# Patient Record
Sex: Female | Born: 1955
Health system: Southern US, Community
[De-identification: ages and names within clinical notes are randomized; demographics above are authoritative.]

## PROBLEM LIST (undated history)

## (undated) ENCOUNTER — Ambulatory Visit: Admission: EM | Payer: PPO

## (undated) DIAGNOSIS — E041 Nontoxic single thyroid nodule: Secondary | ICD-10-CM

## (undated) DIAGNOSIS — M069 Rheumatoid arthritis, unspecified: Secondary | ICD-10-CM

## (undated) DIAGNOSIS — K219 Gastro-esophageal reflux disease without esophagitis: Secondary | ICD-10-CM

## (undated) DIAGNOSIS — Z90710 Acquired absence of both cervix and uterus: Secondary | ICD-10-CM

## (undated) DIAGNOSIS — R059 Cough, unspecified: Secondary | ICD-10-CM

## (undated) DIAGNOSIS — K5792 Diverticulitis of intestine, part unspecified, without perforation or abscess without bleeding: Secondary | ICD-10-CM

## (undated) DIAGNOSIS — M7120 Synovial cyst of popliteal space [Baker], unspecified knee: Secondary | ICD-10-CM

## (undated) DIAGNOSIS — E039 Hypothyroidism, unspecified: Secondary | ICD-10-CM

## (undated) DIAGNOSIS — R499 Unspecified voice and resonance disorder: Secondary | ICD-10-CM

## (undated) DIAGNOSIS — R0981 Nasal congestion: Secondary | ICD-10-CM

## (undated) DIAGNOSIS — M1990 Unspecified osteoarthritis, unspecified site: Secondary | ICD-10-CM

## (undated) DIAGNOSIS — R05 Cough: Secondary | ICD-10-CM

## (undated) HISTORY — DX: Gastro-esophageal reflux disease without esophagitis: K21.9

## (undated) HISTORY — DX: Synovial cyst of popliteal space (Baker), unspecified knee: M71.20

## (undated) HISTORY — DX: Acquired absence of both cervix and uterus: Z90.710

## (undated) HISTORY — DX: Hypothyroidism, unspecified: E03.9

## (undated) HISTORY — DX: Nasal congestion: R09.81

## (undated) HISTORY — DX: Cough: R05

## (undated) HISTORY — DX: Cough, unspecified: R05.9

## (undated) HISTORY — PX: REPLACEMENT TOTAL KNEE: SUR1224

## (undated) HISTORY — DX: Unspecified osteoarthritis, unspecified site: M19.90

## (undated) HISTORY — DX: Unspecified voice and resonance disorder: R49.9

## (undated) HISTORY — DX: Nontoxic single thyroid nodule: E04.1

---

## 1986-01-14 HISTORY — PX: ABDOMINAL HYSTERECTOMY: SHX81

## 1997-10-11 ENCOUNTER — Other Ambulatory Visit: Admission: RE | Admit: 1997-10-11 | Discharge: 1997-10-11 | Payer: Self-pay | Admitting: Obstetrics and Gynecology

## 2000-09-16 ENCOUNTER — Other Ambulatory Visit: Admission: RE | Admit: 2000-09-16 | Discharge: 2000-09-16 | Payer: Self-pay | Admitting: Obstetrics and Gynecology

## 2001-02-27 ENCOUNTER — Encounter: Payer: Self-pay | Admitting: Obstetrics and Gynecology

## 2001-02-27 ENCOUNTER — Ambulatory Visit (HOSPITAL_COMMUNITY): Admission: RE | Admit: 2001-02-27 | Discharge: 2001-02-27 | Payer: Self-pay | Admitting: Obstetrics and Gynecology

## 2001-07-03 ENCOUNTER — Encounter: Admission: RE | Admit: 2001-07-03 | Discharge: 2001-07-03 | Payer: Self-pay | Admitting: Family Medicine

## 2001-07-03 ENCOUNTER — Encounter: Payer: Self-pay | Admitting: Family Medicine

## 2003-03-24 ENCOUNTER — Other Ambulatory Visit: Admission: RE | Admit: 2003-03-24 | Discharge: 2003-03-24 | Payer: Self-pay | Admitting: Obstetrics and Gynecology

## 2003-10-10 ENCOUNTER — Encounter: Admission: RE | Admit: 2003-10-10 | Discharge: 2003-10-10 | Payer: Self-pay | Admitting: Otolaryngology

## 2003-12-12 ENCOUNTER — Encounter (INDEPENDENT_AMBULATORY_CARE_PROVIDER_SITE_OTHER): Payer: Self-pay | Admitting: *Deleted

## 2003-12-12 ENCOUNTER — Ambulatory Visit (HOSPITAL_BASED_OUTPATIENT_CLINIC_OR_DEPARTMENT_OTHER): Admission: RE | Admit: 2003-12-12 | Discharge: 2003-12-12 | Payer: Self-pay | Admitting: Otolaryngology

## 2003-12-12 ENCOUNTER — Ambulatory Visit (HOSPITAL_COMMUNITY): Admission: RE | Admit: 2003-12-12 | Discharge: 2003-12-12 | Payer: Self-pay | Admitting: Otolaryngology

## 2004-11-27 ENCOUNTER — Ambulatory Visit: Payer: Self-pay | Admitting: Internal Medicine

## 2004-11-27 ENCOUNTER — Ambulatory Visit: Payer: Self-pay | Admitting: Family Medicine

## 2006-01-14 HISTORY — PX: SINUS SURGERY WITH INSTATRAK: SHX5215

## 2006-07-09 ENCOUNTER — Ambulatory Visit: Payer: Self-pay | Admitting: Family Medicine

## 2006-07-09 DIAGNOSIS — J329 Chronic sinusitis, unspecified: Secondary | ICD-10-CM | POA: Insufficient documentation

## 2007-04-30 ENCOUNTER — Encounter: Admission: RE | Admit: 2007-04-30 | Discharge: 2007-04-30 | Payer: Self-pay | Admitting: Family Medicine

## 2007-05-05 ENCOUNTER — Ambulatory Visit (HOSPITAL_COMMUNITY): Admission: RE | Admit: 2007-05-05 | Discharge: 2007-05-05 | Payer: Self-pay | Admitting: Family Medicine

## 2007-05-05 ENCOUNTER — Encounter (INDEPENDENT_AMBULATORY_CARE_PROVIDER_SITE_OTHER): Payer: Self-pay | Admitting: Interventional Radiology

## 2007-10-23 ENCOUNTER — Encounter: Admission: RE | Admit: 2007-10-23 | Discharge: 2007-10-23 | Payer: Self-pay | Admitting: Otolaryngology

## 2007-12-14 ENCOUNTER — Ambulatory Visit (HOSPITAL_COMMUNITY): Admission: RE | Admit: 2007-12-14 | Discharge: 2007-12-14 | Payer: Self-pay | Admitting: Gastroenterology

## 2008-02-25 ENCOUNTER — Ambulatory Visit (HOSPITAL_COMMUNITY): Admission: RE | Admit: 2008-02-25 | Discharge: 2008-02-25 | Payer: Self-pay | Admitting: Family Medicine

## 2008-03-03 ENCOUNTER — Encounter (INDEPENDENT_AMBULATORY_CARE_PROVIDER_SITE_OTHER): Payer: Self-pay | Admitting: Cardiology

## 2008-03-03 ENCOUNTER — Ambulatory Visit (HOSPITAL_COMMUNITY): Admission: RE | Admit: 2008-03-03 | Discharge: 2008-03-03 | Payer: Self-pay | Admitting: Cardiology

## 2008-03-18 ENCOUNTER — Encounter (HOSPITAL_COMMUNITY): Admission: RE | Admit: 2008-03-18 | Discharge: 2008-06-16 | Payer: Self-pay | Admitting: Cardiology

## 2008-05-20 ENCOUNTER — Emergency Department (HOSPITAL_COMMUNITY): Admission: EM | Admit: 2008-05-20 | Discharge: 2008-05-20 | Payer: Self-pay | Admitting: Family Medicine

## 2008-06-16 ENCOUNTER — Ambulatory Visit (HOSPITAL_COMMUNITY): Admission: RE | Admit: 2008-06-16 | Discharge: 2008-06-16 | Payer: Self-pay | Admitting: Endocrinology

## 2008-07-28 ENCOUNTER — Encounter: Admission: RE | Admit: 2008-07-28 | Discharge: 2008-07-28 | Payer: Self-pay | Admitting: Obstetrics and Gynecology

## 2009-06-16 ENCOUNTER — Ambulatory Visit (HOSPITAL_COMMUNITY): Admission: RE | Admit: 2009-06-16 | Discharge: 2009-06-16 | Payer: Self-pay | Admitting: Endocrinology

## 2009-08-01 ENCOUNTER — Encounter: Admission: RE | Admit: 2009-08-01 | Discharge: 2009-08-01 | Payer: Self-pay | Admitting: Obstetrics and Gynecology

## 2009-08-04 ENCOUNTER — Encounter: Admission: RE | Admit: 2009-08-04 | Discharge: 2009-08-04 | Payer: Self-pay | Admitting: Obstetrics and Gynecology

## 2009-12-19 ENCOUNTER — Emergency Department (HOSPITAL_COMMUNITY)
Admission: EM | Admit: 2009-12-19 | Discharge: 2009-12-19 | Payer: Self-pay | Source: Home / Self Care | Admitting: Family Medicine

## 2009-12-20 ENCOUNTER — Ambulatory Visit
Admission: RE | Admit: 2009-12-20 | Discharge: 2009-12-20 | Payer: Self-pay | Source: Home / Self Care | Admitting: Family Medicine

## 2009-12-20 ENCOUNTER — Encounter (INDEPENDENT_AMBULATORY_CARE_PROVIDER_SITE_OTHER): Payer: Self-pay | Admitting: Family Medicine

## 2010-01-11 ENCOUNTER — Ambulatory Visit (HOSPITAL_COMMUNITY)
Admission: RE | Admit: 2010-01-11 | Discharge: 2010-01-11 | Payer: Self-pay | Source: Home / Self Care | Attending: Endocrinology | Admitting: Endocrinology

## 2010-02-04 ENCOUNTER — Encounter: Payer: Self-pay | Admitting: Endocrinology

## 2010-02-04 ENCOUNTER — Encounter: Payer: Self-pay | Admitting: Family Medicine

## 2010-02-05 ENCOUNTER — Encounter: Payer: Self-pay | Admitting: Endocrinology

## 2010-03-26 LAB — CBC
HCT: 43 % (ref 36.0–46.0)
Hemoglobin: 14.5 g/dL (ref 12.0–15.0)
MCH: 30.4 pg (ref 26.0–34.0)
MCHC: 33.7 g/dL (ref 30.0–36.0)
MCV: 90.1 fL (ref 78.0–100.0)
Platelets: 199 10*3/uL (ref 150–400)
RDW: 12.7 % (ref 11.5–15.5)
WBC: 6.4 10*3/uL (ref 4.0–10.5)

## 2010-03-26 LAB — POCT URINALYSIS DIPSTICK
Glucose, UA: NEGATIVE mg/dL
Specific Gravity, Urine: 1.01 (ref 1.005–1.030)
pH: 7 (ref 5.0–8.0)

## 2010-03-26 LAB — POCT I-STAT, CHEM 8
BUN: 16 mg/dL (ref 6–23)
Chloride: 102 mEq/L (ref 96–112)
HCT: 47 % — ABNORMAL HIGH (ref 36.0–46.0)
Potassium: 3.8 mEq/L (ref 3.5–5.1)
Sodium: 138 mEq/L (ref 135–145)
TCO2: 28 mmol/L (ref 0–100)

## 2010-03-26 LAB — D-DIMER, QUANTITATIVE: D-Dimer, Quant: 0.66 ug/mL-FEU — ABNORMAL HIGH (ref 0.00–0.48)

## 2010-05-29 NOTE — Op Note (Signed)
NAMEHEER, Olivia Rubio               ACCOUNT NO.:  [id number]   MEDICAL RECORD NO.:  [id number]          PATIENT TYPE:  AMB   LOCATION:  ENDO                         FACILITY:  [facility]   PHYSICIAN:  James L. Malon Kindle., M.D.DATE OF BIRTH:  11-03-1955   DATE OF PROCEDURE:  12/14/2007  DATE OF DISCHARGE:                               OPERATIVE REPORT   PROCEDURE:  Colonoscopy.   MEDICATIONS:  The patient received a total of fentanyl 110 mcg and  Versed 12 mg for both procedures.   INDICATIONS:  The patient has had recurrent bouts of diverticulitis that  has been treated with antibiotics.  This is done to evaluate her colon.   DESCRIPTION OF PROCEDURE:  Procedure explained to the patient and  consent obtained following endoscopy.  The patient was turned around and  the Pentex pediatric colonoscope was inserted.  She had some very mild  diverticular disease in the sigmoid colon, a long tortuous colon.  We  were eventually able to advance to the cecum after putting her in the  supine position and finally the right lateral decubitus position and  using abdominal pressure, the ileocecal valve and the appendiceal  orifice were seen.  The scope was withdrawn.  The cecum, ascending,  transverse and descending colon were seen well.  No polyps were seen  throughout the entire colon.  There was no diverticular disease of  significance in the descending colon.  Again, there was some mild  diverticulosis in the sigmoid.  The scope was withdrawn to the rectum  and internal hemorrhoids were seen in the rectum.  The scope was  withdrawn.  The patient tolerated the procedure well.   ASSESSMENT:  1. Mild diverticulosis of the sigmoid colon with no active signs of      diverticulitis.  2. Long tortuous colon.  3. Internal hemorrhoids.   PLAN:  We will give her a diverticulosis information sheet, keep her on  fiber and soft foods, and we will see her back in the office in 2  months.     ______________________________  Llana Aliment. Malon Kindle., M.D.     Waldron Session  D:  12/14/2007  T:  12/14/2007  Job:  [id number]   cc:   Lyla Son, M.D. [facility] Spore

## 2010-05-29 NOTE — Op Note (Signed)
Olivia Rubio, Olivia Rubio               ACCOUNT NO.:  [id number]   MEDICAL RECORD NO.:  [id number]          PATIENT TYPE:  AMB   LOCATION:  ENDO                         [facility]ACILITY:  [facility]   PHYSICIAN:  James L. Malon Kindle., M.D.DATE OF BIRTH:  [birth year]   DATE OF PROCEDURE:  12/14/2007  DATE OF DISCHARGE:                               OPERATIVE REPORT   PROCEDURE:  Esophagogastroduodenoscopy.   MEDICATIONS:  1. Hurricaine spray.  2. Fentanyl 70 mcg.  3. Versed 8 mg IV.   INDICATIONS:  Chronic esophageal reflux.   DESCRIPTION OF PROCEDURE:  The procedure had been explained to the  patient and consent obtained.  In the left lateral decubitus position,  the Pentax video endoscope was inserted blindly into the esophagus and  advanced under direct visualization.  The stomach was entered, pylorus  identified and passed.  The duodenum including the bulb and second  portion were seen well and were unremarkable.  The pyloric channel and  antrum were normal as was the body of the stomach.  The fundus and  cardia were seen well and were normal.  There was a hiatal hernia and a  widely patent GE junction.  There was no Barrett's esophagus, varices or  esophagitis.  The scope was withdrawn.  The patient tolerated the  procedure well.   ASSESSMENT:  Hiatal hernia with a widely patent gastroesophageal  junction.  I think she clearly has esophageal reflux, no signs of  Barrett's esophagus.   PLAN:  We will go ahead and give her a reflux sheet and continue her on  her antireflux medications.  We will proceed with colonoscopy at this  time as planned.           ______________________________  Llana Aliment. Malon Kindle., M.D.     Waldron Session  D:  12/14/2007  T:  12/14/2007  Job:  [id number]   cc:   Aurther Loft, MD [facility]ore

## 2010-06-01 NOTE — Op Note (Signed)
[name], Olivia Rubio               ACCOUNT NO.:  [id number]   MEDICAL RECORD NO.:  [id number]          PATIENT TYPE:  AMB   LOCATION:  DSC                          FACILITY:  [facility]   PHYSICIAN:  Jefry H. Pollyann Kennedy, MD     DATE OF BIRTH:  [birth year]   DATE OF PROCEDURE:  12/12/2003  DATE OF DISCHARGE:                                 OPERATIVE REPORT   PREOPERATIVE DIAGNOSIS:  1.  Chronic left ethmoid sinusitis.  2.  Left ethmoid mucocele.  3.  Concha bullosa.   POSTOPERATIVE DIAGNOSES:  1.  Chronic left ethmoid sinusitis.  2.  Left ethmoid mucocele.  3.  Concha bullosa.   PROCEDURES:  1.  Left endoscopic total ethmoidectomy.  2.  Left endoscopic middle turbinate resection.   SURGEON:  Jefry H. Pollyann Kennedy, MD   General endotracheal anesthesia was used.  No complications.  Blood loss  minimal.   FINDINGS:  Very large concha bullosa, left middle turbinate.  Hyperplastic  left ethmoid mucosa throughout multiple cells.  A large ethmoid mucocele  with granular yellow, thick material contained within in addition to thick  mucus.   REFERRING PHYSICIAN:  Carola J. Gerri Spore, M.D.   No complications.  The patient tolerated the procedure well, was awakened,  extubated, and transferred to recovery in stable condition.   HISTORY:  This is a 55 year old lady with a history of chronic left ethmoid  sinus disease and large mucocele.  Risks, benefits, alternatives, and  complications of the procedure were explained to the patient, who seemed to  understand and agreed to surgery.   PROCEDURE:  The patient was taken to the operating room and placed on the  operating table in the supine position.  Following induction of general  endotracheal anesthesia, the face was prepped and draped in a standard  fashion.  Oxymetazoline spray was used in the nose preoperatively.  Xylocaine 1% with epinephrine was infiltrated into the superior and  posterior attachment to the middle turbinate and the lateral  nasal wall.  Afrin-soaked pledgets were placed in the left nasal cavity and left middle  meatus for several minutes.   #1.  Left total ethmoidectomy:  The left ethmoidectomy was accomplished  initially by removing the uncinate process with a sickle knife.  Suction was  used to enter into the anterior cells and into the mucocele.  The  microdebrider and through-cut forceps were used to take down all bony  partitions of the ethmoid cells.  The frontal recess area was wide open and  clear.  The natural ostium of the left maxillary sinus was also open and  clear.  A complete ethmoidectomy was performed, keeping the lamina papyracea  intact, the fovea and ethmoid roof intact as well.  The ground lamella was  taken down in order to open up the posterior cells, which is where the  mucocele was contained superiorly.  After complete ethmoidectomy, the  ethmoid cavity was packed with Afrin-soaked pledgets, which were then  removed in the recovery room.   #2.  Left turbinate resection.  A sickle knife was used to enter into the  leading edge of the middle turbinate, which contained a large concha  bullosa.  Through-cut forceps were then used to remove the lateral aspect of  the concha, keeping the medial wall intact.  This was done prior to the  ethmoidectomy and allowed easy access into the ethmoid cavity.      Jefr   JHR/MEDQ  D:  12/12/2003  T:  12/12/2003  Job:  [id number]   cc:   Otilio Connors. Gerri Spore, M.D.  [address]  [place]  [place] [address]  Fax: [phone]

## 2010-06-27 ENCOUNTER — Other Ambulatory Visit (HOSPITAL_COMMUNITY): Payer: Self-pay | Admitting: Orthopedic Surgery

## 2010-06-27 DIAGNOSIS — M25561 Pain in right knee: Secondary | ICD-10-CM

## 2010-06-30 ENCOUNTER — Ambulatory Visit (HOSPITAL_COMMUNITY)
Admission: RE | Admit: 2010-06-30 | Discharge: 2010-06-30 | Disposition: A | Discharge status: Home / Self Care | Payer: Commercial Managed Care - PPO | Source: Ambulatory Visit | Attending: Orthopedic Surgery | Admitting: Orthopedic Surgery

## 2010-06-30 DIAGNOSIS — M2240 Chondromalacia patellae, unspecified knee: Secondary | ICD-10-CM | POA: Insufficient documentation

## 2010-06-30 DIAGNOSIS — X58XXXA Exposure to other specified factors, initial encounter: Secondary | ICD-10-CM | POA: Insufficient documentation

## 2010-06-30 DIAGNOSIS — IMO0002 Reserved for concepts with insufficient information to code with codable children: Secondary | ICD-10-CM | POA: Insufficient documentation

## 2010-06-30 DIAGNOSIS — M25469 Effusion, unspecified knee: Secondary | ICD-10-CM | POA: Insufficient documentation

## 2010-06-30 DIAGNOSIS — M25569 Pain in unspecified knee: Secondary | ICD-10-CM | POA: Insufficient documentation

## 2010-06-30 DIAGNOSIS — M25561 Pain in right knee: Secondary | ICD-10-CM

## 2010-08-28 ENCOUNTER — Ambulatory Visit: Payer: 59 | Attending: Orthopedic Surgery | Admitting: Physical Therapy

## 2010-08-28 DIAGNOSIS — M25669 Stiffness of unspecified knee, not elsewhere classified: Secondary | ICD-10-CM | POA: Insufficient documentation

## 2010-08-28 DIAGNOSIS — M6281 Muscle weakness (generalized): Secondary | ICD-10-CM | POA: Insufficient documentation

## 2010-08-28 DIAGNOSIS — IMO0001 Reserved for inherently not codable concepts without codable children: Secondary | ICD-10-CM | POA: Insufficient documentation

## 2010-08-28 DIAGNOSIS — M25569 Pain in unspecified knee: Secondary | ICD-10-CM | POA: Insufficient documentation

## 2010-09-13 ENCOUNTER — Encounter: Payer: 59 | Admitting: Physical Therapy

## 2010-09-13 ENCOUNTER — Ambulatory Visit: Payer: 59

## 2010-09-21 ENCOUNTER — Encounter: Payer: 59 | Admitting: Physical Therapy

## 2010-10-05 ENCOUNTER — Other Ambulatory Visit: Payer: Self-pay | Admitting: Obstetrics and Gynecology

## 2010-10-05 DIAGNOSIS — Z1231 Encounter for screening mammogram for malignant neoplasm of breast: Secondary | ICD-10-CM

## 2010-10-18 ENCOUNTER — Ambulatory Visit: Payer: 59

## 2010-10-22 ENCOUNTER — Other Ambulatory Visit: Payer: Self-pay | Admitting: Obstetrics and Gynecology

## 2010-10-22 DIAGNOSIS — Z1231 Encounter for screening mammogram for malignant neoplasm of breast: Secondary | ICD-10-CM

## 2010-10-23 ENCOUNTER — Ambulatory Visit
Admission: RE | Admit: 2010-10-23 | Discharge: 2010-10-23 | Disposition: A | Discharge status: Home / Self Care | Payer: 59 | Source: Ambulatory Visit | Attending: Obstetrics and Gynecology | Admitting: Obstetrics and Gynecology

## 2010-10-23 DIAGNOSIS — Z1231 Encounter for screening mammogram for malignant neoplasm of breast: Secondary | ICD-10-CM

## 2010-11-09 ENCOUNTER — Encounter (INDEPENDENT_AMBULATORY_CARE_PROVIDER_SITE_OTHER): Payer: Self-pay | Admitting: Surgery

## 2010-11-12 ENCOUNTER — Encounter (INDEPENDENT_AMBULATORY_CARE_PROVIDER_SITE_OTHER): Payer: Self-pay | Admitting: Surgery

## 2010-11-12 ENCOUNTER — Ambulatory Visit (INDEPENDENT_AMBULATORY_CARE_PROVIDER_SITE_OTHER): Payer: Commercial Managed Care - PPO | Admitting: Surgery

## 2010-11-12 VITALS — BP 138/82 | HR 80 | Temp 97.6°F | Resp 16 | Ht 68.0 in | Wt 206.2 lb

## 2010-11-12 DIAGNOSIS — E042 Nontoxic multinodular goiter: Secondary | ICD-10-CM

## 2010-11-12 NOTE — Progress Notes (Signed)
Chief Complaint  Patient presents with  . Thyroid Nodule    new pt- eval thyroid nodule enlarged right node and trouble swallowing - referral by Dr. Marcelle Overlie    HISTORY: Patient is a 55 year old white female referred by her gynecologist for evaluation of thyroid nodules. She was initially diagnosed in 2008. She has been followed by her primary physician, her gynecologist, and her endocrinologist. Patient is on thyroid medication taking levothyroxine 25 mcg daily. Recent TSH level is in the low normal range. Patient has been followed with sequential ultrasound scanning. Most recent ultrasound is from December 2011. This shows bilateral thyroid nodules with a dominant nodule in the lower left thyroid lobe measuring 1.7 cm in size. She had a previous fine needle aspiration biopsy of this nodule and 2009 which showed changes consistent with nonneoplastic goiter.  Patient has a sister with thyroid nodules. Her daughter has undergone thyroidectomy for thyroid nodules. There is no family history of thyroid cancer. There is no family history of other endocrinopathy.  Patient complains of dysphagia 2-3 times weekly. She experiences a choking sensation. She does have a chronic morning cough. Patient has known gastroesophageal reflux disease and has undergone upper endoscopy by her gastroenterologist in 2011. She is on proton pump inhibitors with poor control.   Past Medical History  Diagnosis Date  . H/O: hysterectomy   . Endometriosis   . Baker's cyst of knee   . Thyroid nodule   . Acid reflux   . Hypothyroidism   . Arthritis   . Osteoporosis      Current Outpatient Prescriptions  Medication Sig Dispense Refill  . levothyroxine (SYNTHROID, LEVOTHROID) 25 MCG tablet Take 25 mcg by mouth daily.        . meloxicam (MOBIC) 15 MG tablet Take 15 mg by mouth daily.        . pantoprazole (PROTONIX) 40 MG tablet Take 40 mg by mouth daily.           No Known Allergies   Family History    Problem Relation Age of Onset  . Thyroid disease Sister   . Arthritis Sister   . Diabetes Father   . Breast cancer Mother   . Cancer Mother     breast  . Cancer Brother     lymphoma  . Cancer Maternal Aunt     breast  . Cancer Paternal Grandmother     breast     History   Social History  . Marital Status: Married    Spouse Name: N/A    Number of Children: N/A  . Years of Education: N/A   Social History Main Topics  . Smoking status: Never Smoker   . Smokeless tobacco: None  . Alcohol Use: No  . Drug Use: No  . Sexually Active:    Other Topics Concern  . None   Social History Narrative  . None     REVIEW OF SYSTEMS - PERTINENT POSITIVES ONLY: Patient notes occasional tremors. She notes occasional palpitations. Patient notes dysphagia 2-3 times weekly with choking sensation. She notes a chronic morning cough.  EXAM: Filed Vitals:   11/12/10 0902  BP: 138/82  Pulse: 80  Temp: 97.6 F (36.4 C)  Resp: 16    HEENT: normocephalic; pupils equal and reactive; sclerae clear; dentition good; mucous membranes moist NECK:  Palpable 1-2 cm nodule in the inferior pole of the left thyroid lobe with swallowing. Isthmus is normal. Right lobe is slightly nodular without discrete or dominant mass; symmetric  on extension; no palpable anterior or posterior cervical lymphadenopathy; no supraclavicular masses; no tenderness CHEST: clear to auscultation bilaterally without rales, rhonchi, or wheezes CARDIAC: regular rate and rhythm without significant murmur; peripheral pulses are full EXT:  non-tender without edema; no deformity NEURO: no gross focal deficits; no sign of tremor   LABORATORY RESULTS: See E-Chart for most recent results   RADIOLOGY RESULTS: See E-Chart or I-Site for most recent results   IMPRESSION: #1 multinodular thyroid with dominant left inferior pole nodule, 1.8 cm, medically stable, with benign cytopathology #2 dysphagia of undetermined  etiology #3 gastroesophageal reflux with poor medical control   PLAN: I discussed the above findings with the patient at length. I provided her with written literature regarding thyroid nodules and they're management. I expressed to her that I do not believe her dysphagia or her cough are related to her relatively small thyroid nodules. She does not have a goiter and I think her thyroid nodules can safely be followed with sequential ultrasound scanning. Thyroid functions are good and she does not need to have any adjustment in her thyroid medication at this point in time.  I suspect her dysphagia and irritated throat and chronic cough are all related to poorly controlled gastroesophageal reflux. She likely requires evaluation by her gastroenterologist.  Patient will return for followup of her thyroid nodules in one year. We will repeat a thyroid ultrasound in December 2012 for comparison to her study in 2011.  Velora Heckler, MD, FACS General & Endocrine Surgery [facility]      Visit Diagnoses: 1. Multinodular goiter (nontoxic), dominant left thyroid nodule     Primary Care Physician: Jeani Hawking, MD, MD  Primary MD:  Hall Busing, MD

## 2010-12-13 ENCOUNTER — Ambulatory Visit (HOSPITAL_COMMUNITY)
Admission: RE | Admit: 2010-12-13 | Discharge: 2010-12-13 | Disposition: A | Discharge status: Home / Self Care | Payer: Commercial Managed Care - PPO | Source: Ambulatory Visit | Attending: Surgery | Admitting: Surgery

## 2010-12-13 DIAGNOSIS — E042 Nontoxic multinodular goiter: Secondary | ICD-10-CM | POA: Insufficient documentation

## 2010-12-13 DIAGNOSIS — R599 Enlarged lymph nodes, unspecified: Secondary | ICD-10-CM | POA: Insufficient documentation

## 2011-01-03 ENCOUNTER — Telehealth (INDEPENDENT_AMBULATORY_CARE_PROVIDER_SITE_OTHER): Payer: Self-pay

## 2011-01-03 NOTE — Telephone Encounter (Signed)
Void/error

## 2011-01-04 ENCOUNTER — Telehealth (INDEPENDENT_AMBULATORY_CARE_PROVIDER_SITE_OTHER): Payer: Self-pay

## 2011-01-04 NOTE — Telephone Encounter (Signed)
Pt called stating she had thyroid ultrasound 3 weeks ago and requested result. I reviewed u/s result with pt and advised her I would be sure Dr Gerrit Friends reviewed the result and would call her if any other recommendations were added.

## 2011-01-09 NOTE — Telephone Encounter (Signed)
Ultrasound results reviewed.  Nodules are stable with no significant changes or worrisome findings.  Recommend follow up ultrasound in 1 year.  Please contact patient with above information.  Thanks,  tmg

## 2011-01-10 ENCOUNTER — Other Ambulatory Visit (INDEPENDENT_AMBULATORY_CARE_PROVIDER_SITE_OTHER): Payer: Self-pay | Admitting: Surgery

## 2011-01-10 DIAGNOSIS — E042 Nontoxic multinodular goiter: Secondary | ICD-10-CM

## 2011-01-10 NOTE — Telephone Encounter (Signed)
done

## 2011-04-25 ENCOUNTER — Other Ambulatory Visit: Payer: Self-pay | Admitting: Family Medicine

## 2011-04-25 ENCOUNTER — Ambulatory Visit
Admission: RE | Admit: 2011-04-25 | Discharge: 2011-04-25 | Disposition: A | Discharge status: Home / Self Care | Payer: 59 | Source: Ambulatory Visit | Attending: Family Medicine | Admitting: Family Medicine

## 2011-04-25 DIAGNOSIS — M2550 Pain in unspecified joint: Secondary | ICD-10-CM

## 2011-08-09 ENCOUNTER — Other Ambulatory Visit: Payer: Self-pay | Admitting: Family Medicine

## 2011-08-09 DIAGNOSIS — R1310 Dysphagia, unspecified: Secondary | ICD-10-CM

## 2011-08-21 ENCOUNTER — Ambulatory Visit
Admission: RE | Admit: 2011-08-21 | Discharge: 2011-08-21 | Disposition: A | Discharge status: Home / Self Care | Payer: 59 | Source: Ambulatory Visit | Attending: Family Medicine | Admitting: Family Medicine

## 2011-08-21 DIAGNOSIS — R1310 Dysphagia, unspecified: Secondary | ICD-10-CM

## 2011-09-20 ENCOUNTER — Other Ambulatory Visit (INDEPENDENT_AMBULATORY_CARE_PROVIDER_SITE_OTHER): Payer: Self-pay

## 2011-09-20 DIAGNOSIS — E041 Nontoxic single thyroid nodule: Secondary | ICD-10-CM

## 2011-10-16 ENCOUNTER — Other Ambulatory Visit: Payer: Self-pay | Admitting: Obstetrics and Gynecology

## 2011-10-16 DIAGNOSIS — Z1231 Encounter for screening mammogram for malignant neoplasm of breast: Secondary | ICD-10-CM

## 2011-10-24 ENCOUNTER — Ambulatory Visit: Payer: 59

## 2011-10-25 ENCOUNTER — Other Ambulatory Visit (HOSPITAL_COMMUNITY): Payer: Self-pay | Admitting: Physical Medicine and Rehabilitation

## 2011-10-25 DIAGNOSIS — M546 Pain in thoracic spine: Secondary | ICD-10-CM

## 2011-10-25 DIAGNOSIS — M542 Cervicalgia: Secondary | ICD-10-CM

## 2011-10-30 ENCOUNTER — Ambulatory Visit (HOSPITAL_COMMUNITY): Payer: 59

## 2011-10-30 ENCOUNTER — Ambulatory Visit (HOSPITAL_COMMUNITY)
Admission: RE | Admit: 2011-10-30 | Discharge: 2011-10-30 | Disposition: A | Discharge status: Home / Self Care | Payer: 59 | Source: Ambulatory Visit | Attending: Physical Medicine and Rehabilitation | Admitting: Physical Medicine and Rehabilitation

## 2011-10-30 DIAGNOSIS — M79609 Pain in unspecified limb: Secondary | ICD-10-CM | POA: Insufficient documentation

## 2011-10-30 DIAGNOSIS — M549 Dorsalgia, unspecified: Secondary | ICD-10-CM | POA: Insufficient documentation

## 2011-10-30 DIAGNOSIS — M542 Cervicalgia: Secondary | ICD-10-CM | POA: Insufficient documentation

## 2011-10-30 DIAGNOSIS — M546 Pain in thoracic spine: Secondary | ICD-10-CM

## 2011-11-06 ENCOUNTER — Encounter (INDEPENDENT_AMBULATORY_CARE_PROVIDER_SITE_OTHER): Payer: Self-pay

## 2011-11-06 ENCOUNTER — Telehealth (INDEPENDENT_AMBULATORY_CARE_PROVIDER_SITE_OTHER): Payer: Self-pay

## 2011-11-06 NOTE — Telephone Encounter (Signed)
Recall letter mailed to pt. Due for thyroid u/s and ov with Dr Gerrit Friends.

## 2011-11-07 ENCOUNTER — Other Ambulatory Visit (INDEPENDENT_AMBULATORY_CARE_PROVIDER_SITE_OTHER): Payer: Self-pay

## 2011-11-13 ENCOUNTER — Ambulatory Visit
Admission: RE | Admit: 2011-11-13 | Discharge: 2011-11-13 | Disposition: A | Discharge status: Home / Self Care | Payer: 59 | Source: Ambulatory Visit | Attending: Obstetrics and Gynecology | Admitting: Obstetrics and Gynecology

## 2011-11-13 DIAGNOSIS — Z1231 Encounter for screening mammogram for malignant neoplasm of breast: Secondary | ICD-10-CM

## 2011-11-20 ENCOUNTER — Ambulatory Visit (HOSPITAL_COMMUNITY)
Admission: RE | Admit: 2011-11-20 | Discharge: 2011-11-20 | Disposition: A | Discharge status: Home / Self Care | Payer: 59 | Source: Ambulatory Visit | Attending: Surgery | Admitting: Surgery

## 2011-11-20 DIAGNOSIS — E041 Nontoxic single thyroid nodule: Secondary | ICD-10-CM | POA: Insufficient documentation

## 2011-11-22 ENCOUNTER — Other Ambulatory Visit: Payer: Self-pay | Admitting: Obstetrics and Gynecology

## 2011-12-17 ENCOUNTER — Other Ambulatory Visit: Payer: Self-pay | Admitting: Obstetrics and Gynecology

## 2012-01-01 ENCOUNTER — Ambulatory Visit (INDEPENDENT_AMBULATORY_CARE_PROVIDER_SITE_OTHER): Payer: Commercial Managed Care - PPO | Admitting: Surgery

## 2012-01-01 ENCOUNTER — Encounter (INDEPENDENT_AMBULATORY_CARE_PROVIDER_SITE_OTHER): Payer: Self-pay | Admitting: Surgery

## 2012-01-01 VITALS — BP 128/78 | HR 76 | Temp 97.8°F | Resp 18 | Ht 68.0 in | Wt 205.5 lb

## 2012-01-01 DIAGNOSIS — E042 Nontoxic multinodular goiter: Secondary | ICD-10-CM

## 2012-01-01 NOTE — Progress Notes (Signed)
General Surgery [facility]  Visit Diagnoses: 1. Multinodular goiter (nontoxic), dominant left thyroid nodule     HISTORY: The patient is a 56 year old white female followed for dominant left thyroid nodule. Patient is on low-dose Synthroid 25 mcg daily. At my request she underwent a followup thyroid ultrasound on 11/20/2011. This shows a normal size right thyroid lobe which is slightly heterogeneous. There is a mildly enlarged left thyroid lobe at 5.2 cm which is also mildly heterogeneous. There is a dominant nodule in the inferior left lobe measuring 1.8 cm in greatest diameter. There are multiple other subcentimeter nodules present in the thyroid.  PERTINENT REVIEW OF SYSTEMS: Patient complains of "soreness in the upper left neck near the angle of the mandible. She has a mild globus sensation. She complains of frequent hoarseness. She denies tremor. Denies palpitations.  EXAM: HEENT: normocephalic; pupils equal and reactive; sclerae clear; dentition good; mucous membranes moist NECK:  Minimally enlarged left thyroid lobe with palpable nodule on swallowing inferior left lobe measuring less than 2 cm in size; no dominant masses in the isthmus or right lobe; symmetric on extension; no palpable anterior or posterior cervical lymphadenopathy; no supraclavicular masses; no tenderness CHEST: clear to auscultation bilaterally without rales, rhonchi, or wheezes CARDIAC: regular rate and rhythm without significant murmur; peripheral pulses are full EXT:  non-tender without edema; no deformity NEURO: no gross focal deficits; no sign of tremor   IMPRESSION: Small multinodular thyroid goiter, clinically stable  PLAN: Patient and I discussed the results at length. Her TSH level is 0.84 on her present dose of Synthroid. The ultrasound exam is stable.  I've recommended follow-up thyroid ultrasound in 2 years. We will see her after that study for physical examination. Certainly if  there should be any significant changes in the interim she should contact our office for evaluation.  Velora Heckler, MD, [facility] Office: [phone]

## 2012-01-01 NOTE — Patient Instructions (Signed)

## 2012-01-10 ENCOUNTER — Ambulatory Visit
Admission: RE | Admit: 2012-01-10 | Discharge: 2012-01-10 | Disposition: A | Discharge status: Home / Self Care | Payer: 59 | Source: Ambulatory Visit | Attending: Family Medicine | Admitting: Family Medicine

## 2012-01-10 ENCOUNTER — Other Ambulatory Visit: Payer: Self-pay | Admitting: Family Medicine

## 2012-01-10 DIAGNOSIS — R911 Solitary pulmonary nodule: Secondary | ICD-10-CM

## 2012-01-10 MED ORDER — IOHEXOL 300 MG/ML  SOLN
75.0000 mL | Freq: Once | INTRAMUSCULAR | Status: AC | PRN
  Administered 2012-01-10: 75 mL via INTRAVENOUS

## 2012-04-22 ENCOUNTER — Other Ambulatory Visit: Payer: Self-pay | Admitting: Obstetrics and Gynecology

## 2012-11-20 ENCOUNTER — Other Ambulatory Visit: Payer: Self-pay

## 2012-11-20 DIAGNOSIS — Z1231 Encounter for screening mammogram for malignant neoplasm of breast: Secondary | ICD-10-CM

## 2012-12-02 ENCOUNTER — Other Ambulatory Visit: Payer: Self-pay | Admitting: Obstetrics and Gynecology

## 2012-12-03 ENCOUNTER — Other Ambulatory Visit (HOSPITAL_COMMUNITY): Payer: Self-pay | Admitting: Obstetrics and Gynecology

## 2012-12-03 DIAGNOSIS — E041 Nontoxic single thyroid nodule: Secondary | ICD-10-CM

## 2012-12-07 ENCOUNTER — Ambulatory Visit (HOSPITAL_COMMUNITY): Payer: 59

## 2012-12-16 ENCOUNTER — Ambulatory Visit (HOSPITAL_COMMUNITY): Payer: 59

## 2012-12-23 ENCOUNTER — Ambulatory Visit (HOSPITAL_COMMUNITY)
Admission: RE | Admit: 2012-12-23 | Discharge: 2012-12-23 | Disposition: A | Discharge status: Home / Self Care | Payer: 59 | Source: Ambulatory Visit | Attending: Obstetrics and Gynecology | Admitting: Obstetrics and Gynecology

## 2012-12-23 ENCOUNTER — Ambulatory Visit
Admission: RE | Admit: 2012-12-23 | Discharge: 2012-12-23 | Disposition: A | Discharge status: Home / Self Care | Payer: 59 | Source: Ambulatory Visit

## 2012-12-23 DIAGNOSIS — E042 Nontoxic multinodular goiter: Secondary | ICD-10-CM | POA: Insufficient documentation

## 2012-12-23 DIAGNOSIS — E041 Nontoxic single thyroid nodule: Secondary | ICD-10-CM

## 2012-12-23 DIAGNOSIS — Z1231 Encounter for screening mammogram for malignant neoplasm of breast: Secondary | ICD-10-CM

## 2013-01-21 ENCOUNTER — Ambulatory Visit (HOSPITAL_COMMUNITY)
Admission: RE | Admit: 2013-01-21 | Discharge: 2013-01-21 | Disposition: A | Discharge status: Home / Self Care | Payer: 59 | Source: Ambulatory Visit | Attending: Family Medicine | Admitting: Family Medicine

## 2013-01-21 ENCOUNTER — Other Ambulatory Visit (HOSPITAL_COMMUNITY): Payer: Self-pay | Admitting: Family Medicine

## 2013-01-21 DIAGNOSIS — R079 Chest pain, unspecified: Secondary | ICD-10-CM | POA: Insufficient documentation

## 2013-01-24 ENCOUNTER — Encounter (HOSPITAL_COMMUNITY): Payer: Self-pay | Admitting: Emergency Medicine

## 2013-01-24 ENCOUNTER — Emergency Department (HOSPITAL_COMMUNITY): Payer: 59

## 2013-01-24 ENCOUNTER — Emergency Department (HOSPITAL_COMMUNITY)
Admission: EM | Admit: 2013-01-24 | Discharge: 2013-01-24 | Disposition: A | Discharge status: Home / Self Care | Payer: 59 | Attending: Emergency Medicine | Admitting: Emergency Medicine

## 2013-01-24 DIAGNOSIS — T450X5A Adverse effect of antiallergic and antiemetic drugs, initial encounter: Secondary | ICD-10-CM | POA: Insufficient documentation

## 2013-01-24 DIAGNOSIS — T4275XA Adverse effect of unspecified antiepileptic and sedative-hypnotic drugs, initial encounter: Secondary | ICD-10-CM | POA: Insufficient documentation

## 2013-01-24 DIAGNOSIS — Z791 Long term (current) use of non-steroidal anti-inflammatories (NSAID): Secondary | ICD-10-CM | POA: Insufficient documentation

## 2013-01-24 DIAGNOSIS — M129 Arthropathy, unspecified: Secondary | ICD-10-CM | POA: Insufficient documentation

## 2013-01-24 DIAGNOSIS — K219 Gastro-esophageal reflux disease without esophagitis: Secondary | ICD-10-CM | POA: Insufficient documentation

## 2013-01-24 DIAGNOSIS — M810 Age-related osteoporosis without current pathological fracture: Secondary | ICD-10-CM | POA: Insufficient documentation

## 2013-01-24 DIAGNOSIS — Z79899 Other long term (current) drug therapy: Secondary | ICD-10-CM | POA: Insufficient documentation

## 2013-01-24 DIAGNOSIS — Z90710 Acquired absence of both cervix and uterus: Secondary | ICD-10-CM | POA: Insufficient documentation

## 2013-01-24 DIAGNOSIS — R05 Cough: Secondary | ICD-10-CM | POA: Insufficient documentation

## 2013-01-24 DIAGNOSIS — T7840XA Allergy, unspecified, initial encounter: Secondary | ICD-10-CM

## 2013-01-24 DIAGNOSIS — Z8742 Personal history of other diseases of the female genital tract: Secondary | ICD-10-CM | POA: Insufficient documentation

## 2013-01-24 DIAGNOSIS — R079 Chest pain, unspecified: Secondary | ICD-10-CM | POA: Insufficient documentation

## 2013-01-24 DIAGNOSIS — T368X5A Adverse effect of other systemic antibiotics, initial encounter: Secondary | ICD-10-CM | POA: Insufficient documentation

## 2013-01-24 DIAGNOSIS — R059 Cough, unspecified: Secondary | ICD-10-CM | POA: Insufficient documentation

## 2013-01-24 DIAGNOSIS — IMO0002 Reserved for concepts with insufficient information to code with codable children: Secondary | ICD-10-CM | POA: Insufficient documentation

## 2013-01-24 DIAGNOSIS — Z8639 Personal history of other endocrine, nutritional and metabolic disease: Secondary | ICD-10-CM | POA: Insufficient documentation

## 2013-01-24 DIAGNOSIS — Z862 Personal history of diseases of the blood and blood-forming organs and certain disorders involving the immune mechanism: Secondary | ICD-10-CM | POA: Insufficient documentation

## 2013-01-24 DIAGNOSIS — R21 Rash and other nonspecific skin eruption: Secondary | ICD-10-CM | POA: Insufficient documentation

## 2013-01-24 LAB — POCT I-STAT, CHEM 8
BUN: 13 mg/dL (ref 6–23)
CHLORIDE: 103 meq/L (ref 96–112)
Calcium, Ion: 1.18 mmol/L (ref 1.12–1.23)
Creatinine, Ser: 0.9 mg/dL (ref 0.50–1.10)
Glucose, Bld: 92 mg/dL (ref 70–99)
HEMATOCRIT: 43 % (ref 36.0–46.0)
Hemoglobin: 14.6 g/dL (ref 12.0–15.0)
Potassium: 3.4 mEq/L — ABNORMAL LOW (ref 3.7–5.3)
SODIUM: 140 meq/L (ref 137–147)
TCO2: 24 mmol/L (ref 0–100)

## 2013-01-24 LAB — CBC WITH DIFFERENTIAL/PLATELET
BASOS ABS: 0 10*3/uL (ref 0.0–0.1)
Basophils Relative: 0 % (ref 0–1)
EOS PCT: 1 % (ref 0–5)
Eosinophils Absolute: 0.1 10*3/uL (ref 0.0–0.7)
HEMATOCRIT: 41.7 % (ref 36.0–46.0)
Hemoglobin: 14.2 g/dL (ref 12.0–15.0)
LYMPHS ABS: 1.8 10*3/uL (ref 0.7–4.0)
LYMPHS PCT: 23 % (ref 12–46)
MCH: 31.3 pg (ref 26.0–34.0)
MCHC: 34.1 g/dL (ref 30.0–36.0)
MCV: 91.9 fL (ref 78.0–100.0)
Monocytes Absolute: 0.4 10*3/uL (ref 0.1–1.0)
Monocytes Relative: 6 % (ref 3–12)
NEUTROS ABS: 5.6 10*3/uL (ref 1.7–7.7)
Neutrophils Relative %: 71 % (ref 43–77)
PLATELETS: 211 10*3/uL (ref 150–400)
RBC: 4.54 MIL/uL (ref 3.87–5.11)
RDW: 13.1 % (ref 11.5–15.5)
WBC: 7.9 10*3/uL (ref 4.0–10.5)

## 2013-01-24 LAB — POCT I-STAT TROPONIN I: TROPONIN I, POC: 0 ng/mL (ref 0.00–0.08)

## 2013-01-24 MED ORDER — HYDROCODONE-ACETAMINOPHEN 5-325 MG PO TABS: 1.0000 | ORAL_TABLET | ORAL | Status: DC | PRN

## 2013-01-24 NOTE — ED Notes (Signed)
Dr Pickering at bedside 

## 2013-01-24 NOTE — ED Notes (Signed)
Pt. Stated, i was put on an antibiotic on Tuesday and Thursday I started to break out in whelps , went to [facility] yesterday and they gave me shot of steroids, today Im worse with some chest pain and rt. Rib cage pain

## 2013-01-24 NOTE — Discharge Instructions (Signed)
Cough, Adult  A cough is a reflex that helps clear your throat and airways. It can help heal the body or may be a reaction to an irritated airway. A cough may only last 2 or 3 weeks (acute) or may last more than 8 weeks (chronic).  CAUSES Acute cough:  Viral or bacterial infections. Chronic cough:  Infections.  Allergies.  Asthma.  Post-nasal drip.  Smoking.  Heartburn or acid reflux.  Some medicines.  Chronic lung problems (COPD).  Cancer. SYMPTOMS   Cough.  Fever.  Chest pain.  Increased breathing rate.  High-pitched whistling sound when breathing (wheezing).  Colored mucus that you cough up (sputum). TREATMENT   A bacterial cough may be treated with antibiotic medicine.  A viral cough must run its course and will not respond to antibiotics.  Your caregiver may recommend other treatments if you have a chronic cough. HOME CARE INSTRUCTIONS   Only take over-the-counter or prescription medicines for pain, discomfort, or fever as directed by your caregiver. Use cough suppressants only as directed by your caregiver.  Use a cold steam vaporizer or humidifier in your bedroom or home to help loosen secretions.  Sleep in a semi-upright position if your cough is worse at night.  Rest as needed.  Stop smoking if you smoke. SEEK IMMEDIATE MEDICAL CARE IF:   You have pus in your sputum.  Your cough starts to worsen.  You cannot control your cough with suppressants and are losing sleep.  You begin coughing up blood.  You have difficulty breathing.  You develop pain which is getting worse or is uncontrolled with medicine.  You have a fever. MAKE SURE YOU:   Understand these instructions.  Will watch your condition.  Will get help right away if you are not doing well or get worse. Document Released: 06/29/2010 Document Revised: 03/25/2011 Document Reviewed: 06/29/2010 [facility] Patient Information 2014 [place].  Drug Allergy Allergic  reactions to medicines are common. Some allergic reactions are mild. A delayed type of drug allergy that occurs 1 week or more after exposure to a medicine or vaccine is called serum sickness. A life-threatening, sudden (acute) allergic reaction that involves the whole body is called anaphylaxis. CAUSES  "True" drug allergies occur when there is an allergic reaction to a medicine. This is caused by overactivity of the immune system. First, the body becomes sensitized. The immune system is triggered by your first exposure to the medicine. Following this first exposure, future exposure to the same medicine may be life-threatening. Almost any medicine can cause an allergic reaction. Common ones are:  Penicillin.  Sulfonamides (sulfa drugs).  Local anesthetics.  X-ray dyes that contain iodine. SYMPTOMS  Common symptoms of a minor allergic reaction are:  Swelling around the mouth.  An itchy red rash or hives.  Vomiting or diarrhea. Anaphylaxis can cause swelling of the mouth and throat. This makes it difficult to breathe and swallow. Severe reactions can be fatal within seconds, even after exposure to only a trace amount of the drug that causes the reaction. HOME CARE INSTRUCTIONS   If you are unsure of what caused your reaction, keep a diary of foods and medicines used. Include the symptoms that followed. Avoid anything that causes reactions.  You may want to follow up with an allergy specialist after the reaction has cleared in order to be tested to confirm the allergy. It is important to confirm that your reaction is an allergy, not just a side effect to the medicine. If you  have a true allergy to a medicine, this may prevent that medicine and related medicines from being given to you when you are very ill.  If you have hives or a rash:  Take medicines as directed by your caregiver.  You may use an over-the-counter antihistamine (diphenhydramine) as needed.  Apply cold compresses to  the skin or take baths in cool water. Avoid hot baths or showers.  If you are severely allergic:  Continuous observation after a severe reaction may be needed. Hospitalization is often required.  Wear a medical alert bracelet or necklace stating your allergy.  You and your family must learn how to use an anaphylaxis kit or give an epinephrine injection to temporarily treat an emergency allergic reaction. If you have had a severe reaction, always carry your epinephrine injection or anaphylaxis kit with you. This can be lifesaving if you have a severe reaction.  Do not drive or perform tasks after treatment until the medicines used to treat your reaction have worn off, or until your caregiver says it is okay. SEEK MEDICAL CARE IF:   You think you had an allergic reaction. Symptoms usually start within 30 minutes after exposure.  Symptoms are getting worse rather than better.  You develop new symptoms.  The symptoms that brought you to your caregiver return. SEEK IMMEDIATE MEDICAL CARE IF:   You have swelling of the mouth, difficulty breathing, or wheezing.  You have a tight feeling in your chest or throat.  You develop hives, swelling, or itching all over your body.  You develop severe vomiting or diarrhea.  You feel faint or pass out. This is an emergency. Use your epinephrine injection or anaphylaxis kit as you have been instructed. Call for emergency medical help. Even if you improve after the injection, you need to be examined at a hospital emergency department. MAKE SURE YOU:   Understand these instructions.  Will watch your condition.  Will get help right away if you are not doing well or get worse. Document Released: 12/31/2004 Document Revised: 03/25/2011 Document Reviewed: 06/06/2010 [facility] Patient Information 2014 [place], [place].

## 2013-01-24 NOTE — ED Provider Notes (Signed)
CSN: [id number]     Arrival date & time 01/24/13  0745 History   First MD Initiated Contact with Patient 01/24/13 [phone]     Chief Complaint  Patient presents with  . Allergic Reaction  . Chest Pain   (Consider location/radiation/quality/duration/timing/severity/associated sxs/prior Treatment) Patient is a 58 y.o. female presenting with allergic reaction and chest pain. The history is provided by the patient.  Allergic Reaction Presenting symptoms: rash   Chest Pain Associated symptoms: cough   Associated symptoms: no abdominal pain, no back pain, no headache, no nausea, no numbness, no shortness of breath, not vomiting and no weakness    patient has a history of rheumatoid arthritis. The last 2 weeks she's had a cough. She went and saw her primary care Dr. who gave her moxifloxacin and a promethazine/codeine cough syrup. She then developed diffuse itchy rash. She had 2 days of medicine, finishing 4 days ago on Wednesday. She was started on prednisone 40 mg, she took for 2 days. She had negative x-ray on Thursday. She went back and saw a clinic and was given a shot of steroids. She's had fexofenadine and Benadryl. She states her upper lip has been swollen. She states her lower lip was swollen too, but did decrease with Benadryl. She's had some worsening right lower chest pain. She also has a dull central chest pain. No fevers. She's been off her other immunosuppression for 2 weeks due to the cough. No previous history of allergic reactions  Past Medical History  Diagnosis Date  . H/O: hysterectomy   . Endometriosis   . Baker's cyst of knee   . Thyroid nodule   . Acid reflux   . Hypothyroidism   . Arthritis   . Osteoporosis   . Cough   . Nasal congestion   . Change in voice    Past Surgical History  Procedure Laterality Date  . Sinus surgery with instatrak  2008  . Abdominal hysterectomy  1988   Family History  Problem Relation Age of Onset  . Thyroid disease Sister   . Arthritis  Sister   . Diabetes Father   . Breast cancer Mother   . Cancer Mother     breast  . Cancer Brother     lymphoma  . Cancer Maternal Aunt     breast  . Cancer Paternal Grandmother     breast  . Cancer Maternal Uncle     colon   History  Substance Use Topics  . Smoking status: Never Smoker   . Smokeless tobacco: Never Used  . Alcohol Use: No   OB History   Grav Para Term Preterm Abortions TAB SAB Ect Mult Living                 Review of Systems  Constitutional: Negative for activity change and appetite change.  Eyes: Negative for pain.  Respiratory: Positive for cough. Negative for chest tightness and shortness of breath.   Cardiovascular: Positive for chest pain. Negative for leg swelling.  Gastrointestinal: Negative for nausea, vomiting, abdominal pain and diarrhea.  Genitourinary: Negative for flank pain.  Musculoskeletal: Negative for back pain and neck stiffness.  Skin: Positive for rash.  Neurological: Negative for weakness, numbness and headaches.  Psychiatric/Behavioral: Negative for behavioral problems.    Allergies  Review of patient's allergies indicates no known allergies.  Home Medications   Current Outpatient Rx  Name  Route  Sig  Dispense  Refill  . albuterol (PROVENTIL HFA;VENTOLIN HFA) 108 (90 BASE)  MCG/ACT inhaler   Inhalation   Inhale 2 puffs into the lungs every 6 (six) hours as needed for wheezing or shortness of breath.         . folic acid (FOLVITE) 1 MG tablet   Oral   Take 1 mg by mouth 2 (two) times daily.         . meloxicam (MOBIC) 15 MG tablet   Oral   Take 15 mg by mouth daily as needed for pain.          . methotrexate 2.5 MG tablet   Oral   Take 2.5 mg by mouth once a week. Takes 10 pills per dosage.         . pantoprazole (PROTONIX) 40 MG tablet   Oral   Take 40 mg by mouth daily.           . predniSONE (DELTASONE) 5 MG tablet   Oral   Take 5 mg by mouth 4 (four) times daily.         .  promethazine-phenylephrine (PROMETHAZINE-PHENYLEPHRINE) 6.25-5 MG/5ML SYRP   Oral   Take 5 mLs by mouth every 4 (four) hours as needed for congestion.         Marland Kitchen HYDROcodone-acetaminophen (NORCO/VICODIN) 5-325 MG per tablet   Oral   Take 1 tablet by mouth every 4 (four) hours as needed for moderate pain (cough).   10 tablet   0    BP 137/70  Pulse 97  Temp(Src) 98.7 F (37.1 C) (Oral)  Resp 16  SpO2 99% Physical Exam  Nursing note and vitals reviewed. Constitutional: She is oriented to person, place, and time. She appears well-developed and well-nourished.  HENT:  Head: Normocephalic and atraumatic.  Lips are swollen mildly, but no petechiae or intraoral edema.  Eyes: EOM are normal. Pupils are equal, round, and reactive to light.  Neck: Normal range of motion. Neck supple.  Cardiovascular: Normal rate, regular rhythm and normal heart sounds.   No murmur heard. Pulmonary/Chest: Effort normal and breath sounds normal. No respiratory distress. She has no wheezes. She has no rales.  Abdominal: Soft. Bowel sounds are normal. She exhibits no distension. There is no tenderness. There is no rebound and no guarding.  Musculoskeletal: Normal range of motion.  Neurological: She is alert and oriented to person, place, and time. No cranial nerve deficit.  Skin: Skin is warm and dry. Rash noted.  Diffuse hives. Mild edema of the face. Mild swelling of her lip. Hives on face upper extremities, chest, abdomen, and lower extremities. In  Psychiatric: She has a normal mood and affect. Her speech is normal.    ED Course  Procedures (including critical care time) Labs Review Labs Reviewed  POCT I-STAT, CHEM 8 - Abnormal; Notable for the following:    Potassium 3.4 (*)    All other components within normal limits  CBC WITH DIFFERENTIAL  POCT I-STAT TROPONIN I   Imaging Review Dg Chest 2 View  01/24/2013   CLINICAL DATA:  Cough, right chest pain  EXAM: CHEST  2 VIEW  COMPARISON:   01/21/2013  FINDINGS: Normal heart size and vascularity. Mild chronic bronchitic and interstitial changes with apical scarring. No interval change. No superimposed pneumonia, edema, collapse or consolidation. No effusion or pneumothorax. Trachea is midline.  IMPRESSION: Stable chest exam.  No superimposed acute process   Electronically Signed   By: Daryll Brod M.D.   On: 01/24/2013 09:59    EKG Interpretation    Date/Time:  Sunday  January 24 2013 07:57:48 EST Ventricular Rate:  97 PR Interval:  154 QRS Duration: 72 QT Interval:  352 QTC Calculation: 447 R Axis:   67 Text Interpretation:  Normal sinus rhythm Nonspecific ST abnormality Abnormal ECG Confirmed by Mabell Esguerra  MD, Lea Baine ([id number]) on 01/24/2013 12:19:13 PM            MDM   1. Allergic reaction, initial encounter    Patient with likely allergic reaction to medicine. His been going for a couple days. She's been stable and started on steroids. No respiratory or pharyngeal involvement. Lip swelling is improved somewhat. She has a taper of prednisone already. X-ray done to evaluate for possible pneumonia and need for a different antibiotic. Will discharge home. Doubt cardiac cause of this chest pain.    Jasper Riling. Alvino Chapel, MD 01/24/13 1219

## 2013-08-11 ENCOUNTER — Ambulatory Visit (HOSPITAL_COMMUNITY)
Admission: RE | Admit: 2013-08-11 | Discharge: 2013-08-11 | Disposition: A | Discharge status: Home / Self Care | Payer: 59 | Source: Ambulatory Visit | Attending: Family Medicine | Admitting: Family Medicine

## 2013-08-11 ENCOUNTER — Other Ambulatory Visit (HOSPITAL_COMMUNITY): Payer: Self-pay | Admitting: Family Medicine

## 2013-08-11 DIAGNOSIS — M79609 Pain in unspecified limb: Secondary | ICD-10-CM | POA: Insufficient documentation

## 2013-08-11 DIAGNOSIS — S99919A Unspecified injury of unspecified ankle, initial encounter: Principal | ICD-10-CM

## 2013-08-11 DIAGNOSIS — X58XXXA Exposure to other specified factors, initial encounter: Secondary | ICD-10-CM | POA: Insufficient documentation

## 2013-08-11 DIAGNOSIS — S99929A Unspecified injury of unspecified foot, initial encounter: Secondary | ICD-10-CM

## 2013-08-11 DIAGNOSIS — S8990XA Unspecified injury of unspecified lower leg, initial encounter: Secondary | ICD-10-CM | POA: Insufficient documentation

## 2013-11-26 ENCOUNTER — Other Ambulatory Visit (INDEPENDENT_AMBULATORY_CARE_PROVIDER_SITE_OTHER): Payer: Self-pay

## 2013-11-26 DIAGNOSIS — E042 Nontoxic multinodular goiter: Secondary | ICD-10-CM

## 2013-11-30 ENCOUNTER — Other Ambulatory Visit: Payer: 59

## 2013-12-02 ENCOUNTER — Ambulatory Visit (HOSPITAL_COMMUNITY)
Admission: RE | Admit: 2013-12-02 | Discharge: 2013-12-02 | Disposition: A | Discharge status: Home / Self Care | Payer: 59 | Source: Ambulatory Visit | Attending: Surgery | Admitting: Surgery

## 2013-12-02 DIAGNOSIS — E042 Nontoxic multinodular goiter: Secondary | ICD-10-CM | POA: Insufficient documentation

## 2013-12-17 ENCOUNTER — Other Ambulatory Visit: Payer: Self-pay | Admitting: Obstetrics and Gynecology

## 2013-12-20 LAB — CYTOLOGY - PAP

## 2014-03-11 ENCOUNTER — Ambulatory Visit (HOSPITAL_COMMUNITY)
Admission: RE | Admit: 2014-03-11 | Discharge: 2014-03-11 | Disposition: A | Discharge status: Home / Self Care | Payer: 59 | Source: Ambulatory Visit | Attending: Family Medicine | Admitting: Family Medicine

## 2014-03-11 ENCOUNTER — Other Ambulatory Visit (HOSPITAL_COMMUNITY): Payer: Self-pay | Admitting: Family Medicine

## 2014-03-11 ENCOUNTER — Encounter (HOSPITAL_COMMUNITY): Payer: Self-pay

## 2014-03-11 DIAGNOSIS — R319 Hematuria, unspecified: Secondary | ICD-10-CM | POA: Insufficient documentation

## 2014-03-11 DIAGNOSIS — N200 Calculus of kidney: Secondary | ICD-10-CM

## 2014-03-11 DIAGNOSIS — R109 Unspecified abdominal pain: Secondary | ICD-10-CM | POA: Insufficient documentation

## 2014-09-15 ENCOUNTER — Other Ambulatory Visit: Payer: Self-pay

## 2014-09-15 DIAGNOSIS — Z1231 Encounter for screening mammogram for malignant neoplasm of breast: Secondary | ICD-10-CM

## 2014-09-28 ENCOUNTER — Ambulatory Visit
Admission: RE | Admit: 2014-09-28 | Discharge: 2014-09-28 | Disposition: A | Discharge status: Home / Self Care | Payer: 59 | Source: Ambulatory Visit

## 2014-09-28 DIAGNOSIS — Z1231 Encounter for screening mammogram for malignant neoplasm of breast: Secondary | ICD-10-CM

## 2014-12-19 ENCOUNTER — Other Ambulatory Visit: Payer: Self-pay | Admitting: Surgery

## 2014-12-19 DIAGNOSIS — E042 Nontoxic multinodular goiter: Secondary | ICD-10-CM

## 2014-12-22 ENCOUNTER — Other Ambulatory Visit (HOSPITAL_COMMUNITY): Payer: Self-pay | Admitting: Surgery

## 2014-12-22 DIAGNOSIS — E042 Nontoxic multinodular goiter: Secondary | ICD-10-CM

## 2014-12-30 ENCOUNTER — Ambulatory Visit (HOSPITAL_COMMUNITY): Payer: 59

## 2015-01-04 ENCOUNTER — Ambulatory Visit (HOSPITAL_COMMUNITY)
Admission: RE | Admit: 2015-01-04 | Discharge: 2015-01-04 | Disposition: A | Discharge status: Home / Self Care | Payer: 59 | Source: Ambulatory Visit | Attending: Surgery | Admitting: Surgery

## 2015-01-04 DIAGNOSIS — E042 Nontoxic multinodular goiter: Secondary | ICD-10-CM | POA: Diagnosis present

## 2015-01-04 DIAGNOSIS — R591 Generalized enlarged lymph nodes: Secondary | ICD-10-CM | POA: Insufficient documentation

## 2015-02-01 MED FILL — METHOTREXATE 2.5 MG TABLET: 2.5 | 84 days supply | Qty: 120 | Fill #1

## 2015-02-01 MED FILL — ORENCIA 125 MG/ML SYRINGE: 125 | 28 days supply | Qty: 4 | Fill #1

## 2015-03-08 MED FILL — ORENCIA 125 MG/ML SYRINGE: 125 | 28 days supply | Qty: 4 | Fill #0

## 2015-03-22 MED FILL — FOLIC ACID 1 MG TABLET: 1 | 90 days supply | Qty: 270 | Fill #0

## 2015-03-22 MED FILL — predniSONE 5 MG TABS: 5 | 30 days supply | Qty: 90 | Fill #2

## 2015-03-22 MED FILL — CLOBETASOL 0.05% OINTMENT: 0.05 | 7 days supply | Qty: 30 | Fill #1

## 2015-03-24 DIAGNOSIS — Z79899 Other long term (current) drug therapy: Secondary | ICD-10-CM | POA: Diagnosis not present

## 2015-03-24 DIAGNOSIS — M2550 Pain in unspecified joint: Secondary | ICD-10-CM | POA: Diagnosis not present

## 2015-03-24 DIAGNOSIS — M0589 Other rheumatoid arthritis with rheumatoid factor of multiple sites: Secondary | ICD-10-CM | POA: Diagnosis not present

## 2015-03-24 DIAGNOSIS — M542 Cervicalgia: Secondary | ICD-10-CM | POA: Diagnosis not present

## 2015-03-24 DIAGNOSIS — M545 Low back pain: Secondary | ICD-10-CM | POA: Diagnosis not present

## 2015-03-28 MED FILL — PANTOPRAZOLE SOD DR 40 MG T: 40 | 90 days supply | Qty: 90 | Fill #1

## 2015-03-31 DIAGNOSIS — R0782 Intercostal pain: Secondary | ICD-10-CM | POA: Diagnosis not present

## 2015-03-31 MED FILL — ORENCIA 125 MG/ML SYRINGE: 125 | 28 days supply | Qty: 4 | Fill #1

## 2015-04-04 ENCOUNTER — Other Ambulatory Visit (HOSPITAL_COMMUNITY): Payer: Self-pay | Admitting: Family Medicine

## 2015-04-04 ENCOUNTER — Ambulatory Visit (HOSPITAL_COMMUNITY)
Admission: RE | Admit: 2015-04-04 | Discharge: 2015-04-04 | Disposition: A | Discharge status: Home / Self Care | Payer: 59 | Source: Ambulatory Visit | Attending: Family Medicine | Admitting: Family Medicine

## 2015-04-04 DIAGNOSIS — R0789 Other chest pain: Secondary | ICD-10-CM

## 2015-04-04 DIAGNOSIS — R918 Other nonspecific abnormal finding of lung field: Secondary | ICD-10-CM | POA: Insufficient documentation

## 2015-04-04 DIAGNOSIS — R0602 Shortness of breath: Secondary | ICD-10-CM | POA: Diagnosis not present

## 2015-04-04 DIAGNOSIS — S29011D Strain of muscle and tendon of front wall of thorax, subsequent encounter: Secondary | ICD-10-CM | POA: Diagnosis not present

## 2015-04-04 DIAGNOSIS — R079 Chest pain, unspecified: Secondary | ICD-10-CM | POA: Diagnosis not present

## 2015-04-04 MED FILL — predniSONE 20 MG TABS: 20 | 9 days supply | Qty: 18 | Fill #0

## 2015-04-04 MED FILL — HYDROCODON-APAP 5-325: 5-325 | 8 days supply | Qty: 60 | Fill #0

## 2015-04-07 MED FILL — CYCLOBENZAPRINE 10 MG TAB: 10 | 10 days supply | Qty: 30 | Fill #0

## 2015-04-20 ENCOUNTER — Other Ambulatory Visit (HOSPITAL_COMMUNITY): Payer: Self-pay | Admitting: Family Medicine

## 2015-04-20 ENCOUNTER — Ambulatory Visit (HOSPITAL_COMMUNITY)
Admission: RE | Admit: 2015-04-20 | Discharge: 2015-04-20 | Disposition: A | Discharge status: Home / Self Care | Payer: 59 | Source: Ambulatory Visit | Attending: Family Medicine | Admitting: Family Medicine

## 2015-04-20 DIAGNOSIS — R05 Cough: Secondary | ICD-10-CM | POA: Diagnosis not present

## 2015-04-20 DIAGNOSIS — R071 Chest pain on breathing: Secondary | ICD-10-CM

## 2015-04-20 DIAGNOSIS — R079 Chest pain, unspecified: Secondary | ICD-10-CM | POA: Insufficient documentation

## 2015-04-21 MED FILL — ORENCIA 125 MG/ML SYRINGE: 125 | 28 days supply | Qty: 4 | Fill #0

## 2015-04-21 MED FILL — AMOX TR-K CLV 875-125 MG TA: 875-125 | 10 days supply | Qty: 20 | Fill #0

## 2015-05-12 MED FILL — METHOTREXATE 2.5 MG TABLET: 2.5 | 84 days supply | Qty: 120 | Fill #0

## 2015-05-12 MED FILL — CLOBETASOL 0.05% OINTMENT: 0.05 | 7 days supply | Qty: 30 | Fill #2

## 2015-05-30 MED FILL — ORENCIA 125 MG/ML SYRINGE: 125 | 28 days supply | Qty: 4 | Fill #1

## 2015-05-31 ENCOUNTER — Encounter: Payer: 59 | Admitting: Pharmacist

## 2015-06-01 ENCOUNTER — Ambulatory Visit: Payer: 59 | Admitting: Pharmacist

## 2015-06-01 DIAGNOSIS — M069 Rheumatoid arthritis, unspecified: Secondary | ICD-10-CM

## 2015-06-01 MED ORDER — ABATACEPT 125 MG/ML ~~LOC~~ SOSY: 125.0000 mg | PREFILLED_SYRINGE | SUBCUTANEOUS | Status: DC

## 2015-06-01 NOTE — Progress Notes (Signed)
S: Patient presents today to the [facility].  Patient is currently taking Orencia for Rheumatoid Arthritis. Patient is managed by Gavin Pound for this.   Adherence: denies any missed doses  Dosing: SubQ: 125 mg subQ once weekly on Fridays  Drug-drug interactions: meloxicam - patient never took meloxicam due to interaction.   Screenings: TB screening: completed prior to initiation  Hepatitis Screening: completed prior to initiation  Blood glucose: Orencia contains maltose which make falsely elevate glucose levels  Monitoring: S/sx of infection: none S/sx of hypersensitivity: none Other adverse effects: none Patient is on folic acid while on methotrexate  O:     Lab Results  Component Value Date   WBC 7.9 01/24/2013   HGB 14.6 01/24/2013   HCT 43.0 01/24/2013   MCV 91.9 01/24/2013   PLT 211 01/24/2013      Chemistry      Component Value Date/Time   NA 140 01/24/2013 0914   K 3.4* 01/24/2013 0914   CL 103 01/24/2013 0914   BUN 13 01/24/2013 0914   CREATININE 0.90 01/24/2013 0914   No results found for: CALCIUM, ALKPHOS, AST, ALT, BILITOT   More recent labs outside of Epic where reviewed via [place].  A/P: 1. Medication review: Patient tolerating medication well with no adverse effects. Dosing is appropriate and no current drug-drug interactions. Reviewed Orencia with the patient. Maureen Chatters is a selective T-cell costimulation blocker indicated for rheumatoid arthritis. The most common adverse effects are infections, headache, and injection site reactions. There is a possible adverse effect of increased risk of malignancy but it is not fully understood if this is due to the drug or the disease state itself. The patient was instructed to avoid use of live vaccinations without the approval of a physician. No issues were identified. Patient will follow up with her rheumatologist as scheduled and will let me know if she has any  questions or concerns.   Nicoletta Ba, PharmD, BCPS, [facility] [phone]   Evaluation and management procedures were performed by the Advanced Practitioner (CPP) under my supervision and collaboration. I have reviewed the Advanced Practitioner's note and chart, and I agree with the management and plan.   Angelica Chessman, MD, [place], [place], [place], [place] and [place], [place]   06/07/2015, 1:06 PM

## 2015-06-15 MED FILL — FOLIC ACID 1 MG TABLET: 1 | 90 days supply | Qty: 270 | Fill #1

## 2015-06-15 MED FILL — predniSONE 5 MG TABS: 5 | 30 days supply | Qty: 90 | Fill #3

## 2015-06-23 ENCOUNTER — Other Ambulatory Visit: Payer: Self-pay | Admitting: Pharmacist

## 2015-06-23 DIAGNOSIS — M0589 Other rheumatoid arthritis with rheumatoid factor of multiple sites: Secondary | ICD-10-CM | POA: Diagnosis not present

## 2015-06-23 DIAGNOSIS — M545 Low back pain: Secondary | ICD-10-CM | POA: Diagnosis not present

## 2015-06-23 DIAGNOSIS — M25561 Pain in right knee: Secondary | ICD-10-CM | POA: Diagnosis not present

## 2015-06-23 DIAGNOSIS — M150 Primary generalized (osteo)arthritis: Secondary | ICD-10-CM | POA: Diagnosis not present

## 2015-06-23 DIAGNOSIS — M2550 Pain in unspecified joint: Secondary | ICD-10-CM | POA: Diagnosis not present

## 2015-06-23 DIAGNOSIS — Z79899 Other long term (current) drug therapy: Secondary | ICD-10-CM | POA: Diagnosis not present

## 2015-06-23 DIAGNOSIS — M542 Cervicalgia: Secondary | ICD-10-CM | POA: Diagnosis not present

## 2015-06-23 MED ORDER — ABATACEPT 125 MG/ML ~~LOC~~ SOSY: PREFILLED_SYRINGE | SUBCUTANEOUS | Status: DC

## 2015-06-23 MED FILL — ORENCIA 125 MG/ML SYRINGE: 125 | 28 days supply | Qty: 4 | Fill #0

## 2015-06-23 MED FILL — MELOXICAM 7.5 MG TABLET: 7.5 | 30 days supply | Qty: 60 | Fill #0

## 2015-07-07 MED FILL — PANTOPRAZOLE SOD DR 40 MG T: 40 | 90 days supply | Qty: 90 | Fill #2

## 2015-07-25 MED FILL — ORENCIA 125 MG/ML SYRINGE: 125 | 28 days supply | Qty: 4 | Fill #1

## 2015-07-25 MED FILL — METHOTREXATE 2.5 MG TABLET: 2.5 | 84 days supply | Qty: 120 | Fill #0

## 2015-08-16 MED FILL — predniSONE 5 MG TABS: 5 | 90 days supply | Qty: 90 | Fill #0

## 2015-08-21 MED FILL — ORENCIA 125 MG/ML SYRINGE: 125 | 28 days supply | Qty: 4 | Fill #2

## 2015-08-21 MED FILL — MELOXICAM 7.5 MG TABLET: 7.5 | 30 days supply | Qty: 60 | Fill #1

## 2015-09-20 MED FILL — MELOXICAM 7.5 MG TABLET: 7.5 | 30 days supply | Qty: 60 | Fill #2

## 2015-09-20 MED FILL — ORENCIA 125 MG/ML SYRINGE: 125 | 28 days supply | Qty: 4 | Fill #3

## 2015-09-20 MED FILL — FOLIC ACID 1 MG TABLET: 1 | 90 days supply | Qty: 270 | Fill #2

## 2015-09-28 DIAGNOSIS — Z79899 Other long term (current) drug therapy: Secondary | ICD-10-CM | POA: Diagnosis not present

## 2015-09-28 DIAGNOSIS — M545 Low back pain: Secondary | ICD-10-CM | POA: Diagnosis not present

## 2015-09-28 DIAGNOSIS — M25561 Pain in right knee: Secondary | ICD-10-CM | POA: Diagnosis not present

## 2015-09-28 DIAGNOSIS — M2550 Pain in unspecified joint: Secondary | ICD-10-CM | POA: Diagnosis not present

## 2015-09-28 DIAGNOSIS — M150 Primary generalized (osteo)arthritis: Secondary | ICD-10-CM | POA: Diagnosis not present

## 2015-09-28 DIAGNOSIS — M0589 Other rheumatoid arthritis with rheumatoid factor of multiple sites: Secondary | ICD-10-CM | POA: Diagnosis not present

## 2015-09-28 DIAGNOSIS — M542 Cervicalgia: Secondary | ICD-10-CM | POA: Diagnosis not present

## 2015-09-29 MED FILL — LEFLUNOMIDE 20 MG TABLET: 20 | 30 days supply | Qty: 30 | Fill #0

## 2015-10-11 MED FILL — PANTOPRAZOLE SOD DR 40 MG T: 40 | 90 days supply | Qty: 90 | Fill #0

## 2015-10-11 MED FILL — CLOBETASOL 0.05% OINTMENT: 0.05 | 7 days supply | Qty: 30 | Fill #3

## 2015-10-17 DIAGNOSIS — M0589 Other rheumatoid arthritis with rheumatoid factor of multiple sites: Secondary | ICD-10-CM | POA: Diagnosis not present

## 2015-10-27 MED FILL — ORENCIA 125 MG/ML SYRINGE: 125 | 28 days supply | Qty: 4 | Fill #4

## 2015-10-27 MED FILL — LEFLUNOMIDE 20 MG TABLET: 20 | 30 days supply | Qty: 30 | Fill #1

## 2015-10-27 MED FILL — METHOTREXATE 2.5 MG TABLET: 2.5 | 84 days supply | Qty: 120 | Fill #0

## 2015-10-27 MED FILL — predniSONE 5 MG TABS: 5 | 90 days supply | Qty: 90 | Fill #0

## 2015-10-28 DIAGNOSIS — Z23 Encounter for immunization: Secondary | ICD-10-CM | POA: Diagnosis not present

## 2015-11-24 MED FILL — LEFLUNOMIDE 20 MG TABLET: 20 | 30 days supply | Qty: 30 | Fill #2

## 2015-11-24 MED FILL — ORENCIA 125 MG/ML SYRINGE: 125 | 28 days supply | Qty: 4 | Fill #5

## 2015-11-30 DIAGNOSIS — M545 Low back pain: Secondary | ICD-10-CM | POA: Diagnosis not present

## 2015-11-30 DIAGNOSIS — M150 Primary generalized (osteo)arthritis: Secondary | ICD-10-CM | POA: Diagnosis not present

## 2015-11-30 DIAGNOSIS — M25561 Pain in right knee: Secondary | ICD-10-CM | POA: Diagnosis not present

## 2015-11-30 DIAGNOSIS — Z79899 Other long term (current) drug therapy: Secondary | ICD-10-CM | POA: Diagnosis not present

## 2015-11-30 DIAGNOSIS — R05 Cough: Secondary | ICD-10-CM | POA: Diagnosis not present

## 2015-11-30 DIAGNOSIS — M0589 Other rheumatoid arthritis with rheumatoid factor of multiple sites: Secondary | ICD-10-CM | POA: Diagnosis not present

## 2015-11-30 DIAGNOSIS — M2550 Pain in unspecified joint: Secondary | ICD-10-CM | POA: Diagnosis not present

## 2015-11-30 DIAGNOSIS — R0609 Other forms of dyspnea: Secondary | ICD-10-CM | POA: Diagnosis not present

## 2015-11-30 DIAGNOSIS — M542 Cervicalgia: Secondary | ICD-10-CM | POA: Diagnosis not present

## 2015-11-30 MED FILL — CYCLOBENZAPRINE 10 MG TAB: 10 | 30 days supply | Qty: 30 | Fill #0

## 2015-12-20 ENCOUNTER — Other Ambulatory Visit: Payer: Self-pay | Admitting: Pharmacist

## 2015-12-20 MED ORDER — ABATACEPT 125 MG/ML ~~LOC~~ SOSY: PREFILLED_SYRINGE | SUBCUTANEOUS | 5 refills | Status: DC

## 2015-12-20 MED FILL — LEFLUNOMIDE 20 MG TABLET: 20 | 30 days supply | Qty: 30 | Fill #0

## 2015-12-20 MED FILL — MELOXICAM 7.5 MG TABLET: 7.5 | 30 days supply | Qty: 60 | Fill #0

## 2015-12-20 MED FILL — ORENCIA 125 MG/ML SYRINGE: 125 | 28 days supply | Qty: 4 | Fill #0

## 2015-12-20 MED FILL — FOLIC ACID 1 MG TABLET: 1 | 90 days supply | Qty: 270 | Fill #3

## 2016-01-01 ENCOUNTER — Institutional Professional Consult (permissible substitution): Payer: 59 | Admitting: Emergency Medicine

## 2016-01-02 ENCOUNTER — Ambulatory Visit (INDEPENDENT_AMBULATORY_CARE_PROVIDER_SITE_OTHER): Payer: Self-pay | Admitting: Orthopaedic Surgery

## 2016-01-03 ENCOUNTER — Other Ambulatory Visit (HOSPITAL_COMMUNITY): Payer: Self-pay | Admitting: Obstetrics and Gynecology

## 2016-01-03 DIAGNOSIS — Z01419 Encounter for gynecological examination (general) (routine) without abnormal findings: Secondary | ICD-10-CM | POA: Diagnosis not present

## 2016-01-03 DIAGNOSIS — Z6832 Body mass index (BMI) 32.0-32.9, adult: Secondary | ICD-10-CM | POA: Diagnosis not present

## 2016-01-03 DIAGNOSIS — Z79899 Other long term (current) drug therapy: Secondary | ICD-10-CM | POA: Diagnosis not present

## 2016-01-03 DIAGNOSIS — Z1212 Encounter for screening for malignant neoplasm of rectum: Secondary | ICD-10-CM | POA: Diagnosis not present

## 2016-01-03 DIAGNOSIS — M0589 Other rheumatoid arthritis with rheumatoid factor of multiple sites: Secondary | ICD-10-CM | POA: Diagnosis not present

## 2016-01-03 DIAGNOSIS — R1011 Right upper quadrant pain: Secondary | ICD-10-CM

## 2016-01-12 ENCOUNTER — Other Ambulatory Visit: Payer: Self-pay | Admitting: Obstetrics and Gynecology

## 2016-01-12 ENCOUNTER — Ambulatory Visit (HOSPITAL_COMMUNITY)
Admission: RE | Admit: 2016-01-12 | Discharge: 2016-01-12 | Disposition: A | Discharge status: Home / Self Care | Payer: 59 | Source: Ambulatory Visit | Attending: Obstetrics and Gynecology | Admitting: Obstetrics and Gynecology

## 2016-01-12 DIAGNOSIS — R1011 Right upper quadrant pain: Secondary | ICD-10-CM | POA: Diagnosis not present

## 2016-01-12 DIAGNOSIS — Z1231 Encounter for screening mammogram for malignant neoplasm of breast: Secondary | ICD-10-CM

## 2016-01-12 DIAGNOSIS — K760 Fatty (change of) liver, not elsewhere classified: Secondary | ICD-10-CM | POA: Diagnosis not present

## 2016-01-12 MED FILL — HYDROCODON-APAP 10-325: 10-325 | 4 days supply | Qty: 30 | Fill #0

## 2016-01-16 MED FILL — LEFLUNOMIDE 20 MG TABLET: 20 | 30 days supply | Qty: 30 | Fill #1

## 2016-01-16 MED FILL — ORENCIA 125 MG/ML SYRINGE: 125 | 28 days supply | Qty: 4 | Fill #1

## 2016-02-05 MED FILL — PANTOPRAZOLE SOD DR 40 MG T: 40 | 90 days supply | Qty: 90 | Fill #0

## 2016-02-05 MED FILL — predniSONE 5 MG TABS: 5 | 90 days supply | Qty: 90 | Fill #0

## 2016-02-08 ENCOUNTER — Ambulatory Visit
Admission: RE | Admit: 2016-02-08 | Discharge: 2016-02-08 | Disposition: A | Discharge status: Home / Self Care | Payer: 59 | Source: Ambulatory Visit | Attending: Obstetrics and Gynecology | Admitting: Obstetrics and Gynecology

## 2016-02-08 DIAGNOSIS — Z1231 Encounter for screening mammogram for malignant neoplasm of breast: Secondary | ICD-10-CM

## 2016-02-09 ENCOUNTER — Encounter: Payer: Self-pay | Admitting: Pharmacist

## 2016-02-15 DIAGNOSIS — H524 Presbyopia: Secondary | ICD-10-CM | POA: Diagnosis not present

## 2016-02-15 DIAGNOSIS — H5203 Hypermetropia, bilateral: Secondary | ICD-10-CM | POA: Diagnosis not present

## 2016-02-15 DIAGNOSIS — H52223 Regular astigmatism, bilateral: Secondary | ICD-10-CM | POA: Diagnosis not present

## 2016-02-15 MED FILL — ORENCIA 125 MG/ML SYRINGE: 125 | 28 days supply | Qty: 4 | Fill #2

## 2016-02-15 MED FILL — LEFLUNOMIDE 20 MG TABLET: 20 | 30 days supply | Qty: 30 | Fill #0

## 2016-03-06 DIAGNOSIS — M150 Primary generalized (osteo)arthritis: Secondary | ICD-10-CM | POA: Diagnosis not present

## 2016-03-06 DIAGNOSIS — M2550 Pain in unspecified joint: Secondary | ICD-10-CM | POA: Diagnosis not present

## 2016-03-06 DIAGNOSIS — M79642 Pain in left hand: Secondary | ICD-10-CM | POA: Diagnosis not present

## 2016-03-06 DIAGNOSIS — Z6832 Body mass index (BMI) 32.0-32.9, adult: Secondary | ICD-10-CM | POA: Diagnosis not present

## 2016-03-06 DIAGNOSIS — M545 Low back pain: Secondary | ICD-10-CM | POA: Diagnosis not present

## 2016-03-06 DIAGNOSIS — Z79899 Other long term (current) drug therapy: Secondary | ICD-10-CM | POA: Diagnosis not present

## 2016-03-06 DIAGNOSIS — M0589 Other rheumatoid arthritis with rheumatoid factor of multiple sites: Secondary | ICD-10-CM | POA: Diagnosis not present

## 2016-03-06 DIAGNOSIS — R202 Paresthesia of skin: Secondary | ICD-10-CM | POA: Diagnosis not present

## 2016-03-06 DIAGNOSIS — M79641 Pain in right hand: Secondary | ICD-10-CM | POA: Diagnosis not present

## 2016-03-06 DIAGNOSIS — M542 Cervicalgia: Secondary | ICD-10-CM | POA: Diagnosis not present

## 2016-03-06 DIAGNOSIS — M25561 Pain in right knee: Secondary | ICD-10-CM | POA: Diagnosis not present

## 2016-03-13 MED FILL — METHOTREXATE 2.5 MG TABLET: 2.5 | 84 days supply | Qty: 120 | Fill #0

## 2016-03-13 MED FILL — LEFLUNOMIDE 20 MG TABLET: 20 | 30 days supply | Qty: 30 | Fill #1

## 2016-03-13 MED FILL — ORENCIA 125 MG/ML SYRINGE: 125 | 28 days supply | Qty: 4 | Fill #3

## 2016-03-28 ENCOUNTER — Other Ambulatory Visit: Payer: Self-pay | Admitting: Pharmacist

## 2016-03-28 MED ORDER — SARILUMAB 200 MG/1.14ML ~~LOC~~ SOSY: 1.0000 | PREFILLED_SYRINGE | SUBCUTANEOUS | 2 refills | Status: DC

## 2016-03-29 ENCOUNTER — Other Ambulatory Visit: Payer: Self-pay | Admitting: Pharmacist

## 2016-03-29 DIAGNOSIS — R7989 Other specified abnormal findings of blood chemistry: Secondary | ICD-10-CM | POA: Diagnosis not present

## 2016-03-29 DIAGNOSIS — K219 Gastro-esophageal reflux disease without esophagitis: Secondary | ICD-10-CM | POA: Diagnosis not present

## 2016-03-29 MED ORDER — SARILUMAB 200 MG/1.14ML ~~LOC~~ SOSY: 1.0000 | PREFILLED_SYRINGE | SUBCUTANEOUS | 2 refills | Status: DC

## 2016-03-29 MED FILL — KEVZARA 200 MG/1.14ML SOSY: 200 | 28 days supply | Qty: 2 | Fill #0 | Status: TO

## 2016-03-29 MED FILL — PANTOPRAZOLE SOD DR 20 MG T: 20 | 30 days supply | Qty: 60 | Fill #0

## 2016-04-01 ENCOUNTER — Institutional Professional Consult (permissible substitution): Payer: 59 | Admitting: Pulmonary Disease

## 2016-04-15 MED FILL — FOLIC ACID 1 MG TABLET: 1 | 90 days supply | Qty: 270 | Fill #0

## 2016-04-15 MED FILL — LEFLUNOMIDE 20 MG TABLET: 20 | 30 days supply | Qty: 30 | Fill #0

## 2016-04-23 ENCOUNTER — Institutional Professional Consult (permissible substitution): Payer: 59 | Admitting: Pulmonary Disease

## 2016-04-30 MED FILL — KEVZARA 200 MG/1.14ML SOSY: 200 | 28 days supply | Qty: 2 | Fill #0

## 2016-05-01 MED FILL — predniSONE 5 MG TABS: 5 | 90 days supply | Qty: 90 | Fill #0

## 2016-05-01 MED FILL — PANTOPRAZOLE SOD DR 40 MG T: 40 | 90 days supply | Qty: 90 | Fill #1

## 2016-05-15 MED FILL — LEFLUNOMIDE 20 MG TABLET: 20 | 30 days supply | Qty: 30 | Fill #1

## 2016-05-16 DIAGNOSIS — M069 Rheumatoid arthritis, unspecified: Secondary | ICD-10-CM | POA: Diagnosis not present

## 2016-05-16 DIAGNOSIS — R946 Abnormal results of thyroid function studies: Secondary | ICD-10-CM | POA: Diagnosis not present

## 2016-05-16 DIAGNOSIS — Z0000 Encounter for general adult medical examination without abnormal findings: Secondary | ICD-10-CM | POA: Diagnosis not present

## 2016-05-16 DIAGNOSIS — Z13220 Encounter for screening for lipoid disorders: Secondary | ICD-10-CM | POA: Diagnosis not present

## 2016-05-16 DIAGNOSIS — Z5181 Encounter for therapeutic drug level monitoring: Secondary | ICD-10-CM | POA: Diagnosis not present

## 2016-05-27 MED FILL — KEVZARA 200 MG/1.14ML SOSY: 200 | 28 days supply | Qty: 2 | Fill #1

## 2016-05-31 ENCOUNTER — Institutional Professional Consult (permissible substitution): Payer: 59 | Admitting: Pulmonary Disease

## 2016-06-05 ENCOUNTER — Ambulatory Visit: Payer: 59 | Attending: Internal Medicine | Admitting: Pharmacist

## 2016-06-05 DIAGNOSIS — M545 Low back pain: Secondary | ICD-10-CM | POA: Diagnosis not present

## 2016-06-05 DIAGNOSIS — M0589 Other rheumatoid arthritis with rheumatoid factor of multiple sites: Secondary | ICD-10-CM | POA: Diagnosis not present

## 2016-06-05 DIAGNOSIS — R202 Paresthesia of skin: Secondary | ICD-10-CM | POA: Diagnosis not present

## 2016-06-05 DIAGNOSIS — Z79899 Other long term (current) drug therapy: Secondary | ICD-10-CM | POA: Diagnosis not present

## 2016-06-05 DIAGNOSIS — M542 Cervicalgia: Secondary | ICD-10-CM | POA: Diagnosis not present

## 2016-06-05 DIAGNOSIS — M069 Rheumatoid arthritis, unspecified: Secondary | ICD-10-CM

## 2016-06-05 DIAGNOSIS — R0609 Other forms of dyspnea: Secondary | ICD-10-CM | POA: Diagnosis not present

## 2016-06-05 DIAGNOSIS — M2550 Pain in unspecified joint: Secondary | ICD-10-CM | POA: Diagnosis not present

## 2016-06-05 DIAGNOSIS — M25561 Pain in right knee: Secondary | ICD-10-CM | POA: Diagnosis not present

## 2016-06-05 DIAGNOSIS — M150 Primary generalized (osteo)arthritis: Secondary | ICD-10-CM | POA: Diagnosis not present

## 2016-06-05 NOTE — Progress Notes (Signed)
   S: Patient presents to [facility] for review of their specialty medication therapy.  Patient is currently taking Kevzara for rheumatoid arthritis. Patient is managed by [name] for this.   Adherence: denies any missed doses  FDA-approved dosing:Rheumatoid arthritis: SubQ: 200 mg once every 2 weeks. Note: May use as monotherapy or in combination with nonbiologic DMARDs. Do not use in combination with biologic DMARDs.   Dose adjustments: Renal impairment: none needed for this patient Hepatic impairment: hx of elevated LFTs - monitoring, and followed by GI (see CareEverywhere note)  Hematologic toxicity: ANC >1,000/mm3: No dosage adjustment necessary. ANC 500 to 1,000/mm3: Interrupt therapy; when [place] >1,000/mm3, resume at 150 mg once every 2 weeks (may increase to 200 mg once every 2 weeks as clinically appropriate). ANC <500/mm3: Discontinue. Platelets 50,000 to 100,000/mm3: Interrupt therapy; when platelet count is >100,000/mm3, resume at 150 mg once every 2 weeks (may increase to 200 mg once every 2 weeks as clinically appropriate). Platelets <50,000/mm3: If confirmed by repeat testing, discontinue. Nonhematologic toxicity: Hypersensitivity (anaphylaxis or other clinically significant hypersensitivity reaction): Stop immediately and discontinue permanently. Infection (serious): Interrupt treatment until the infection is controlled.   Drug-drug interactions: none  Screenings: TB screening: completed per patient Hepatitis Screening: completed per patient  Monitoring: S/sx of infection: denies S/sx of hypersensitivity: denies Injection site reactions: reports one injection causing a mark and bruise on her abdomen. Other adverse effects:denies  O:     Lab Results  Component Value Date   WBC 7.9 01/24/2013   HGB 14.6 01/24/2013   HCT 43.0 01/24/2013   MCV 91.9 01/24/2013   PLT 211 01/24/2013      Chemistry      Component Value Date/Time   NA 140 01/24/2013 0914   K 3.4 (L) 01/24/2013 0914   CL 103 01/24/2013 0914   BUN 13 01/24/2013 0914   CREATININE 0.90 01/24/2013 0914   No results found for: CALCIUM, ALKPHOS, AST, ALT, BILITOT     A/P: 1. Medication review: Patient currently on Kevzara for the treatment of rheumatoid arthritis and is tolerating it well but denies any improvement in her control of rheumatoid arthritis. She has still failed to come off of prednisone. Reviewed the medication with the patient, including the following: Sarilumab is an interleukin-6 (IL-6) receptor antagonist which binds to both soluble and membrane-bound IL-6 receptors. Endogenous IL-6 is induced by inflammatory stimuli and mediates a variety of immunological responses. IL-6 produced by synovial and endothelial cells leads to local production of IL-6 in joints affected by inflammatory processes such as rheumatoid arthritis. Inhibition of IL-6 receptors by sarilumab leads to a reduction in CRP levels. The most common adverse effects include increased risk of infection, injection site reactions, and neutropenia. There is a possible adverse effect of increased risk of malignancy but it is not fully understood if this is due to the drug or the disease state itself. The patient was instructed to avoid use of live vaccinations without the approval of a physician. No recommendations for any changes at this time.   Christella Hartigan, PharmD, BCPS, BCACP, [place] [facility] [phone]

## 2016-06-12 MED FILL — LEFLUNOMIDE 20 MG TABLET: 20 | 30 days supply | Qty: 30 | Fill #2

## 2016-06-12 MED FILL — PANTOPRAZOLE SOD DR 20 MG T: 20 | 60 days supply | Qty: 120 | Fill #1

## 2016-06-25 ENCOUNTER — Other Ambulatory Visit: Payer: Self-pay | Admitting: Internal Medicine

## 2016-06-27 ENCOUNTER — Other Ambulatory Visit: Payer: Self-pay | Admitting: Pharmacist

## 2016-06-27 MED ORDER — SARILUMAB 200 MG/1.14ML ~~LOC~~ SOSY: 1.0000 | PREFILLED_SYRINGE | SUBCUTANEOUS | 2 refills | Status: DC

## 2016-06-27 MED FILL — KEVZARA 200 MG/1.14ML SOSY: 200 | 28 days supply | Qty: 2 | Fill #0

## 2016-07-06 DIAGNOSIS — M76811 Anterior tibial syndrome, right leg: Secondary | ICD-10-CM | POA: Diagnosis not present

## 2016-07-06 DIAGNOSIS — M79604 Pain in right leg: Secondary | ICD-10-CM | POA: Diagnosis not present

## 2016-07-12 MED FILL — predniSONE 5 MG TABS: 5 | 12 days supply | Qty: 48 | Fill #0

## 2016-07-12 MED FILL — LEFLUNOMIDE 20 MG TABLET: 20 | 30 days supply | Qty: 30 | Fill #0

## 2016-07-12 MED FILL — FOLIC ACID 1 MG TABLET: 1 | 90 days supply | Qty: 270 | Fill #1

## 2016-07-16 ENCOUNTER — Ambulatory Visit (INDEPENDENT_AMBULATORY_CARE_PROVIDER_SITE_OTHER): Payer: 59 | Admitting: Orthopedic Surgery

## 2016-07-16 DIAGNOSIS — S82891A Other fracture of right lower leg, initial encounter for closed fracture: Secondary | ICD-10-CM | POA: Diagnosis not present

## 2016-07-16 DIAGNOSIS — M25571 Pain in right ankle and joints of right foot: Secondary | ICD-10-CM | POA: Diagnosis not present

## 2016-07-16 DIAGNOSIS — S82831A Other fracture of upper and lower end of right fibula, initial encounter for closed fracture: Secondary | ICD-10-CM | POA: Diagnosis not present

## 2016-07-16 MED FILL — HYDROCODON-APAP 5-325: 5-325 | 3 days supply | Qty: 20 | Fill #0

## 2016-07-16 NOTE — Progress Notes (Signed)
Office Visit Note   Patient: Olivia Rubio           Date of Birth: [birth year]           MRN: [id number] Visit Date: 07/16/2016              Requested by: Maurice Small, MD [place], [place] [address] PCP: Maurice Small, MD  No chief complaint on file.     HPI: Patient is a 61 year old woman with a history of osteoporosis as well as rheumatoid arthritis who is been on prednisone as well as methotrexate and states that she was walking about 3 weeks ago had had an acute onset of pain without any trauma. She states she did go see a physician radiographs were obtained and did not show fracture. She states that she had persistent pain she was seen and repeat radiographs shows a Weber C nondisplaced fibular fracture. Patient states she received Toradol and steroid injection today.  Assessment & Plan: Visit Diagnoses:  1. Ankle fracture, right, closed, initial encounter     Plan: Patient will be placed in a fracture boot weightbearing as tolerated. Follow-up the office in 3 weeks with repeat 3 view radiographs out of the boot.  Follow-Up Instructions: Return in about 3 weeks (around 08/06/2016).   Ortho Exam  Patient is alert, oriented, no adenopathy, well-dressed, normal affect, normal respiratory effort. Examination patient has a good dorsalis pedis pulse she does have significant swelling with pitting edema around the right ankle. There is no blisters. Patient is ambulating with crutches. She is point tender to palpation over the fracture site. Radiographs are reviewed which shows a congruent mortise without displacement of the ankle joint. She has a Weber C fibular fracture that is not displaced. There is no tenderness to palpation across the syndesmosis. No lytic bony changes.  Imaging: No results found.  Labs: No results found for: HGBA1C, ESRSEDRATE, CRP, LABURIC, REPTSTATUS, GRAMSTAIN, CULT, LABORGA  Orders:  No orders of the defined types were  placed in this encounter.  No orders of the defined types were placed in this encounter.    Procedures: No procedures performed  Clinical Data: No additional findings.  ROS:  All other systems negative, except as noted in the HPI. Review of Systems  Objective: Vital Signs: There were no vitals taken for this visit.  Specialty Comments:  No specialty comments available.  PMFS History: Patient Active Problem List   Diagnosis Date Noted  . Ankle fracture, right, closed, initial encounter 07/16/2016  . Multinodular goiter (nontoxic), dominant left thyroid nodule 11/12/2010  . SINUSITIS 07/09/2006   Past Medical History:  Diagnosis Date  . Acid reflux   . Arthritis   . Baker's cyst of knee   . Change in voice   . Cough   . Endometriosis   . H/O: hysterectomy   . Hypothyroidism   . Nasal congestion   . Osteoporosis   . Thyroid nodule     Family History  Problem Relation Age of Onset  . Thyroid disease Sister   . Arthritis Sister   . Diabetes Father   . Breast cancer Mother   . Cancer Mother        breast  . Cancer Brother        lymphoma  . Cancer Maternal Aunt        breast  . Cancer Paternal Grandmother        breast  . Cancer Maternal Uncle  colon    Past Surgical History:  Procedure Laterality Date  . ABDOMINAL HYSTERECTOMY  1988  . SINUS SURGERY WITH INSTATRAK  2008   Social History   Occupational History  . Not on file.   Social History Main Topics  . Smoking status: Never Smoker  . Smokeless tobacco: Never Used  . Alcohol use No  . Drug use: No  . Sexual activity: Not on file

## 2016-07-18 ENCOUNTER — Telehealth (INDEPENDENT_AMBULATORY_CARE_PROVIDER_SITE_OTHER): Payer: Self-pay | Admitting: Orthopedic Surgery

## 2016-07-18 ENCOUNTER — Ambulatory Visit (INDEPENDENT_AMBULATORY_CARE_PROVIDER_SITE_OTHER): Payer: 59 | Admitting: Family

## 2016-07-18 NOTE — Telephone Encounter (Signed)
PT ASKED IF SHE MAY HAVE A TEMPORARY HANDICAP STICKER PLEASE.  [phone]

## 2016-07-19 NOTE — Telephone Encounter (Signed)
Form complete just needs Dr. Jess Barters signature. Can you please have him sign on Monday? Its on my desk

## 2016-07-22 NOTE — Telephone Encounter (Signed)
I called and left a voicemail advised she can pick this up at the front desk.

## 2016-07-29 MED FILL — PANTOPRAZOLE SOD DR 40 MG T: 40 | 90 days supply | Qty: 90 | Fill #2

## 2016-07-29 MED FILL — KEVZARA 200 MG/1.14ML SOSY: 200 | 28 days supply | Qty: 2 | Fill #1

## 2016-08-06 ENCOUNTER — Ambulatory Visit (INDEPENDENT_AMBULATORY_CARE_PROVIDER_SITE_OTHER): Payer: 59

## 2016-08-06 ENCOUNTER — Encounter (INDEPENDENT_AMBULATORY_CARE_PROVIDER_SITE_OTHER): Payer: Self-pay | Admitting: Orthopedic Surgery

## 2016-08-06 ENCOUNTER — Ambulatory Visit (INDEPENDENT_AMBULATORY_CARE_PROVIDER_SITE_OTHER): Payer: 59 | Admitting: Orthopedic Surgery

## 2016-08-06 DIAGNOSIS — S82891D Other fracture of right lower leg, subsequent encounter for closed fracture with routine healing: Secondary | ICD-10-CM

## 2016-08-06 NOTE — Progress Notes (Signed)
Office Visit Note   Patient: Olivia Rubio           Date of Birth: [birth year]           MRN: [id number] Visit Date: 08/06/2016              Requested by: Maurice Small, MD [place], [place] [address] PCP: Maurice Small, MD  Chief Complaint  Patient presents with  . Right Ankle - Fracture    Weber C nondisplaced fibular right ankle      HPI: Patient presents follow-up she is 3 weeks in a fracture boot for a Weber C fibular fracture on the right. Patient states she is feeling better but not completely healed.  Assessment & Plan: Visit Diagnoses:  1. Right malleolar fracture, closed, with routine healing, subsequent encounter     Plan: Recommend he continue the fracture boot for 3 weeks. Repeat 3 view radiographs of the right ankle of follow-up. Recommended vitamin D3 5000 international units a day.  Follow-Up Instructions: Return in about 3 weeks (around 08/27/2016).   Ortho Exam  Patient is alert, oriented, no adenopathy, well-dressed, normal affect, normal respiratory effort. Examination patient ambulates well with a fracture boot. There is no skin breakdown the x-ray shows stable alignment.  Imaging: Xr Ankle Complete Right  Result Date: 08/06/2016 Three-view radiographs of the right ankle shows increased callus formation at the fracture site. There has been little bit of shortening but the mortise is congruent.   Labs: No results found for: HGBA1C, ESRSEDRATE, CRP, LABURIC, REPTSTATUS, GRAMSTAIN, CULT, LABORGA  Orders:  Orders Placed This Encounter  Procedures  . XR Ankle Complete Right   No orders of the defined types were placed in this encounter.    Procedures: No procedures performed  Clinical Data: No additional findings.  ROS:  All other systems negative, except as noted in the HPI. Review of Systems  Objective: Vital Signs: There were no vitals taken for this visit.  Specialty Comments:  No specialty comments  available.  PMFS History: Patient Active Problem List   Diagnosis Date Noted  . Ankle fracture, right, closed, initial encounter 07/16/2016  . Multinodular goiter (nontoxic), dominant left thyroid nodule 11/12/2010  . SINUSITIS 07/09/2006   Past Medical History:  Diagnosis Date  . Acid reflux   . Arthritis   . Baker's cyst of knee   . Change in voice   . Cough   . Endometriosis   . H/O: hysterectomy   . Hypothyroidism   . Nasal congestion   . Osteoporosis   . Thyroid nodule     Family History  Problem Relation Age of Onset  . Diabetes Father   . Breast cancer Mother   . Cancer Mother        breast  . Cancer Brother        lymphoma  . Cancer Paternal Grandmother        breast  . Cancer Maternal Uncle        colon  . Thyroid disease Sister   . Arthritis Sister   . Cancer Maternal Aunt        breast    Past Surgical History:  Procedure Laterality Date  . ABDOMINAL HYSTERECTOMY  1988  . SINUS SURGERY WITH INSTATRAK  2008   Social History   Occupational History  . Not on file.   Social History Main Topics  . Smoking status: Never Smoker  . Smokeless tobacco: Never Used  .  Alcohol use No  . Drug use: No  . Sexual activity: Not on file

## 2016-08-20 MED FILL — LEFLUNOMIDE 20 MG TABLET: 20 | 30 days supply | Qty: 30 | Fill #1

## 2016-08-26 ENCOUNTER — Ambulatory Visit (INDEPENDENT_AMBULATORY_CARE_PROVIDER_SITE_OTHER): Payer: 59

## 2016-08-26 ENCOUNTER — Encounter (INDEPENDENT_AMBULATORY_CARE_PROVIDER_SITE_OTHER): Payer: Self-pay | Admitting: Family

## 2016-08-26 ENCOUNTER — Ambulatory Visit (INDEPENDENT_AMBULATORY_CARE_PROVIDER_SITE_OTHER): Payer: 59 | Admitting: Orthopedic Surgery

## 2016-08-26 ENCOUNTER — Ambulatory Visit (INDEPENDENT_AMBULATORY_CARE_PROVIDER_SITE_OTHER): Payer: 59 | Admitting: Family

## 2016-08-26 VITALS — Ht 68.0 in | Wt 205.0 lb

## 2016-08-26 DIAGNOSIS — S82891D Other fracture of right lower leg, subsequent encounter for closed fracture with routine healing: Secondary | ICD-10-CM

## 2016-08-26 DIAGNOSIS — M25571 Pain in right ankle and joints of right foot: Secondary | ICD-10-CM | POA: Diagnosis not present

## 2016-08-26 MED FILL — KEVZARA 200 MG/1.14ML SOSY: 200 | 28 days supply | Qty: 2 | Fill #2

## 2016-08-26 NOTE — Progress Notes (Signed)
Office Visit Note   Patient: Olivia Rubio           Date of Birth: 10-Jun-1955           MRN: [id number] Visit Date: 08/26/2016              Requested by: Maurice Small, MD [place], [place] [address] PCP: Maurice Small, MD  Chief Complaint  Patient presents with  . Right Ankle - Follow-up    Weber C fib fx      HPI: The patient is a 61 year old woman who presents today in follow-up for a Weber C ankle fracture on the right. States this happened around [employer]. She's been full weightbearing in a fracture boot and feels well has no concerns today. She is taking vitamin D3, 5,000 units daily. She states she did walk without her boot back and forth to the bathroom a couple times over the weekend and this felt okay.  Assessment & Plan: Visit Diagnoses:  1. Closed fracture of right ankle with routine healing, subsequent encounter   2. Pain in right ankle and joints of right foot     Plan: Discontinue the fracture boot  increase activities as tolerated follow-up in office in 3-4 weeks if has any concerns.  Follow-Up Instructions: Return in about 3 weeks (around 09/16/2016).   Ortho Exam  Patient is alert, oriented, no adenopathy, well-dressed, normal affect, normal respiratory effort. Him the right ankle is minimally tender over the fracture site there is no swelling no ecchymosis no erythema ankle ligaments are nontender and stable  Imaging: Xr Ankle Complete Right  Result Date: 08/26/2016 Radiographs of the right ankle show good callus formation of the fibular fracture site. There has been little bit of shortening but the mortise is congruent.  No images are attached to the encounter.  Labs: No results found for: HGBA1C, ESRSEDRATE, CRP, LABURIC, REPTSTATUS, GRAMSTAIN, CULT, LABORGA  Orders:  Orders Placed This Encounter  Procedures  . XR Ankle Complete Right   No orders of the defined types were placed in this encounter.    Procedures: No procedures performed  Clinical Data: No additional findings.  ROS:  All other systems negative, except as noted in the HPI. Review of Systems  Constitutional: Negative for chills and fever.  Cardiovascular: Negative for leg swelling.  Musculoskeletal: Positive for arthralgias. Negative for joint swelling.    Objective: Vital Signs: Ht 5\' 8"  (1.727 m)   Wt 205 lb (93 kg)   BMI 31.17 kg/m   Specialty Comments:  No specialty comments available.  PMFS History: Patient Active Problem List   Diagnosis Date Noted  . Ankle fracture, right, closed, initial encounter 07/16/2016  . Multinodular goiter (nontoxic), dominant left thyroid nodule 11/12/2010  . SINUSITIS 07/09/2006   Past Medical History:  Diagnosis Date  . Acid reflux   . Arthritis   . Baker's cyst of knee   . Change in voice   . Cough   . Endometriosis   . H/O: hysterectomy   . Hypothyroidism   . Nasal congestion   . Osteoporosis   . Thyroid nodule     Family History  Problem Relation Age of Onset  . Diabetes Father   . Breast cancer Mother   . Cancer Mother        breast  . Cancer Brother        lymphoma  . Cancer Paternal Grandmother        breast  .  Cancer Maternal Uncle        colon  . Thyroid disease Sister   . Arthritis Sister   . Cancer Maternal Aunt        breast    Past Surgical History:  Procedure Laterality Date  . ABDOMINAL HYSTERECTOMY  1988  . SINUS SURGERY WITH INSTATRAK  2008   Social History   Occupational History  . Not on file.   Social History Main Topics  . Smoking status: Never Smoker  . Smokeless tobacco: Never Used  . Alcohol use No  . Drug use: No  . Sexual activity: Not on file

## 2016-08-29 ENCOUNTER — Encounter: Payer: Self-pay | Admitting: Internal Medicine

## 2016-08-29 ENCOUNTER — Ambulatory Visit (INDEPENDENT_AMBULATORY_CARE_PROVIDER_SITE_OTHER)
Admission: RE | Admit: 2016-08-29 | Discharge: 2016-08-29 | Disposition: A | Discharge status: Home / Self Care | Payer: 59 | Source: Ambulatory Visit | Attending: Internal Medicine | Admitting: Internal Medicine

## 2016-08-29 ENCOUNTER — Ambulatory Visit (INDEPENDENT_AMBULATORY_CARE_PROVIDER_SITE_OTHER): Payer: 59 | Admitting: Internal Medicine

## 2016-08-29 VITALS — BP 132/76 | HR 87 | Ht 68.0 in | Wt 210.0 lb

## 2016-08-29 DIAGNOSIS — R05 Cough: Secondary | ICD-10-CM | POA: Diagnosis not present

## 2016-08-29 DIAGNOSIS — R058 Other specified cough: Secondary | ICD-10-CM

## 2016-08-29 MED ORDER — FAMOTIDINE 20 MG PO TABS: ORAL_TABLET | ORAL | 2 refills | Status: DC

## 2016-08-29 MED ORDER — BENZONATATE 200 MG PO CAPS: 200.0000 mg | ORAL_CAPSULE | Freq: Three times a day (TID) | ORAL | 2 refills | Status: DC | PRN

## 2016-08-29 MED FILL — BENZONATATE 200 MG CAPSULE: 200 | 15 days supply | Qty: 45 | Fill #0

## 2016-08-29 MED FILL — FAMOTIDINE 20 MG TABLET: 20 | 30 days supply | Qty: 30 | Fill #0

## 2016-08-29 NOTE — Progress Notes (Signed)
Subjective:    Patient ID: Olivia Rubio, female    DOB: Feb 29, 1955,    MRN: [id number]  HPI  61 yowf never smoker works as  Actuary for the elderly was perfectly healthy until  around age  61 developed chronic cough attributed to reflux by ent  as had overt HB rx with pantoprazole which fixed the HB but not the cough  then dx RA around 2012 on prednisone and after rx with mtx cough   worse and mtx tapered then cough worsed again around 02/2016 with constant throat clearing so referred to pulmonary clinic 08/29/2016 by [name] ? ILD related to RA or mtx   08/29/2016 1st  Pulmonary office visit/ Micki Cassel   Chief Complaint  Patient presents with  . Advice Only    referred for chronic cough.    seems to get better as soon as head hits pillow and doesn't wake her up but right after first drinks am water starts up again  Assoc urinary incont and sometimes to point of gagging but not vomiting     Kouffman Reflux v Neurogenic Cough Differentiator Reflux Comments  Do you awaken from a sound sleep coughing violently?                            With trouble breathing? no   Do you have choking episodes when you cannot  Get enough air, gasping for air ?              no   Do you usually cough when you lie down into  The bed, or when you just lie down to rest ?                          no   Do you usually cough after meals or eating?         Yes, no pattern   Do you cough when (or after) you bend over?    no   GERD SCORE     Kouffman Reflux v Neurogenic Cough Differentiator Neurogenic   Do you more-or-less cough all day long? yes   Does change of temperature make you cough? no   Does laughing or chuckling cause you to cough? no   Do fumes (perfume, automobile fumes, burned  Toast, etc.,) cause you to cough ?      no   Does speaking, singing, or talking on the phone cause you to cough   ?               Singing    Neurogenic/Airway score      Still taking protonix at hs only / prednisone 5  mg daily  If not coughing then not sob   No obvious other patterns in day to day or daytime variabilty or assoc   cp or chest tightness, subjective wheeze overt sinus or hb symptoms. No unusual exp hx or h/o childhood pna/ asthma or knowledge of premature birth.  Sleeping ok without nocturnal  or early am exacerbation  of respiratory  c/o's or need for noct saba. Also denies any obvious fluctuation of symptoms with weather or environmental changes or other aggravating or alleviating factors except as outlined above   Current Medications, Allergies, Complete Past Medical History, Past Surgical History, Family History, and Social History were reviewed in [employer] record.  Review of Systems  Constitutional: Negative for fever and unexpected weight change.  HENT: Negative for congestion, dental problem, ear pain, nosebleeds, postnasal drip, rhinorrhea, sinus pressure, sneezing, sore throat and trouble swallowing.   Eyes: Negative for redness and itching.  Respiratory: Positive for cough and shortness of breath. Negative for chest tightness and wheezing.   Cardiovascular: Negative for palpitations and leg swelling.  Gastrointestinal: Negative for nausea and vomiting.  Genitourinary: Negative for dysuria.  Musculoskeletal: Negative for joint swelling.  Skin: Negative for rash.  Neurological: Negative for headaches.  Hematological: Does not bruise/bleed easily.  Psychiatric/Behavioral: Negative for dysphoric mood. The patient is not nervous/anxious.        Objective:   Physical Exam  amb wf constantly throat clearing   Wt Readings from Last 3 Encounters:  08/29/16 210 lb (95.3 kg)  08/26/16 205 lb (93 kg)  01/01/12 205 lb 8 oz (93.2 kg)    Vital signs reviewed  - Note on arrival 02 sats  95% on RA      HEENT: nl dentition, turbinates bilaterally, and oropharynx. Nl external ear canals without cough reflex   NECK :  without JVD/Nodes/TM/ nl  carotid upstrokes bilaterally   LUNGS: no acc muscle use,  Nl contour chest which is clear to A and P bilaterally without cough on insp or exp maneuvers   CV:  RRR  no s3 or murmur or increase in P2, and no edema   ABD:  soft and nontender with nl inspiratory excursion in the supine position. No bruits or organomegaly appreciated, bowel sounds nl  MS:  Nl gait/ ext warm without deformities, calf tenderness, cyanosis or clubbing No obvious joint restrictions   SKIN: warm and dry without lesions    NEURO:  alert, approp, nl sensorium with  no motor or cerebellar deficits apparent.      CXR PA and Lateral:   08/29/2016 :    I personally reviewed images and agree with radiology impression as follows:   There is no edema or consolidation. Heart size and pulmonary vascularity are normal. No adenopathy. There is aortic atherosclerosis. No evident bone lesions. My impression:  No convincing ild          Assessment & Plan:

## 2016-08-29 NOTE — Patient Instructions (Addendum)
Pantoprazole (protonix) 40 mg   Take  30-60 min before first meal of the day and Pepcid (famotidine)  20 mg one @  bedtime until return to office - this is the best way to tell whether stomach acid is contributing to your problem.    For drainage / throat tickle try take CHLORPHENIRAMINE  4 mg - take one every 4 hours as needed - available over the counter- may cause drowsiness so start with just a bedtime dose or two and see how you tolerate it before trying in daytime   For throat clearing >  Tessalon pearls 200 mg every 6 hours as needed   GERD (REFLUX)  is an extremely common cause of respiratory symptoms just like yours , many times with no obvious heartburn at all.    It can be treated with medication, but also with lifestyle changes including elevation of the head of your bed (ideally with 6 inch  bed blocks),  Smoking cessation, avoidance of late meals, excessive alcohol, and avoid fatty foods, chocolate, peppermint, colas, red wine, and acidic juices such as orange juice.  NO MINT OR MENTHOL PRODUCTS SO NO COUGH DROPS   USE SUGARLESS CANDY INSTEAD (Jolley ranchers or Stover's or Life Savers) or even ice chips will also do - the key is to swallow to prevent all throat clearing. NO OIL BASED VITAMINS - use powdered substitutes.    Please remember to go to the  x-ray department downstairs in the basement  for your tests - we will call you with the results when they are available.   Please schedule a follow up office visit in 4 weeks, sooner if needed  with all medications /inhalers/ solutions in hand so we can verify exactly what you are taking. This includes all medications from all doctors and over the counters

## 2016-08-30 ENCOUNTER — Telehealth: Payer: Self-pay | Admitting: Internal Medicine

## 2016-08-30 NOTE — Telephone Encounter (Signed)
Notes recorded by Tanda Rockers, MD on 08/29/2016 at 8:03 PM EDT Call pt: Reviewed cxr and no acute change so no change in recommendations made at [facility] and spoke with pt and she is aware of results per MW., nothing further is needed.

## 2016-08-30 NOTE — Assessment & Plan Note (Addendum)
Trial of max rx for gerd and pnds (1st gen1) and tessalon   The most common causes of chronic cough in immunocompetent adults include the following: upper airway cough syndrome (UACS), previously referred to as postnasal drip syndrome (PNDS), which is caused by variety of rhinosinus conditions; (2) asthma; (3) GERD; (4) chronic bronchitis from cigarette smoking or other inhaled environmental irritants; (5) nonasthmatic eosinophilic bronchitis; and (6) bronchiectasis.   These conditions, singly or in combination, have accounted for up to 94% of the causes of chronic cough in prospective studies.   Other conditions have constituted no >6% of the causes in prospective studies These have included bronchogenic carcinoma, chronic interstitial pneumonia (unlikely given absence of cough on deep inspiration and nl cxr) , sarcoidosis, left ventricular failure, ACEI-induced cough, and aspiration from a condition associated with pharyngeal dysfunction.    Chronic cough is often simultaneously caused by more than one condition. A single cause has been found from 38 to 82% of the time, multiple causes from 18 to 62%. Multiply caused cough has been the result of three diseases up to 42% of the time.   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   These are the same pts (now being labeled as having "irritable larynx syndrome" by some cough centers) who not infrequently have a history of having failed  to tolerate ace inhibitors,  dry powder inhalers or biphosphonates or report having atypical/extraesophageal reflux symptoms that don't respond to standard doses of PPI  and are easily confused as having aecopd or asthma flares by even experienced allergists/ pulmonologists (myself included).   Trial of max rx for gerd and pnds (1st gen1) and tessalon then return with all meds in hand using a trust but verify approach to confirm accurate Medication  Reconciliation The principal here is that until we are certain that the  patients are doing what we've asked, it makes no sense to ask them to do more.   Next step would be return for allergy profile/ sinus CT and consider  trial of gabapentin titrated up to max of 300 mg tid or refer to [facility] voice center as given the cough x 10 year hx it's unlikely we can eliminate it completely to her satisfaction    Advised pt The standardized cough guidelines published in Chest by Lissa Morales in 2006 are still the best available and consist of a multiple step process (up to 12!) , not a single office visit,  and are intended  to address this problem logically,  with an alogrithm dependent on response to empiric treatment at  each progressive step  to determine a specific diagnosis with  minimal addtional testing needed. Therefore if adherence is an issue or can't be accurately verified,  it's very unlikely the standard evaluation and treatment will be successful here.    Furthermore, response to therapy (other than acute cough suppression, which should only be used short term with avoidance of narcotic containing cough syrups if possible), can  be a gradual process for which the patient is not likely to  perceive immediate benefit.  Unlike going to an eye doctor where the best perscription is almost always the first one and is immediately effective, this is almost never the case in the management of chronic cough syndromes. Therefore the patient needs to commit up front  to consistently adhere to recommendations  for up to 6 weeks of therapy directed at the likely underlying problem(s) before the response can be reasonably evaluated.    Total time devoted to counseling  > 50 % of initial 60 min office visit:  review case with pt/ discussion of options/alternatives/ personally creating written customized instructions  in presence of pt  then going over those specific  Instructions directly with the pt including how to use all of the meds but in particular covering each new medication in detail and the difference between the maintenance= "automatic" meds and the prns using an action plan format for the latter (If this problem/symptom => do that organization reading Left to right).  Please see AVS from this visit for a full list of these instructions which I personally wrote for this pt and  are unique to this visit.

## 2016-09-12 DIAGNOSIS — R05 Cough: Secondary | ICD-10-CM | POA: Diagnosis not present

## 2016-09-12 DIAGNOSIS — Z79899 Other long term (current) drug therapy: Secondary | ICD-10-CM | POA: Diagnosis not present

## 2016-09-12 DIAGNOSIS — S92901D Unspecified fracture of right foot, subsequent encounter for fracture with routine healing: Secondary | ICD-10-CM | POA: Diagnosis not present

## 2016-09-12 DIAGNOSIS — M545 Low back pain: Secondary | ICD-10-CM | POA: Diagnosis not present

## 2016-09-12 DIAGNOSIS — M0589 Other rheumatoid arthritis with rheumatoid factor of multiple sites: Secondary | ICD-10-CM | POA: Diagnosis not present

## 2016-09-12 DIAGNOSIS — R0609 Other forms of dyspnea: Secondary | ICD-10-CM | POA: Diagnosis not present

## 2016-09-12 DIAGNOSIS — M150 Primary generalized (osteo)arthritis: Secondary | ICD-10-CM | POA: Diagnosis not present

## 2016-09-12 DIAGNOSIS — M25561 Pain in right knee: Secondary | ICD-10-CM | POA: Diagnosis not present

## 2016-09-12 DIAGNOSIS — M2550 Pain in unspecified joint: Secondary | ICD-10-CM | POA: Diagnosis not present

## 2016-09-18 MED FILL — LEFLUNOMIDE 20 MG TABLET: 20 | 30 days supply | Qty: 30 | Fill #2

## 2016-09-24 ENCOUNTER — Other Ambulatory Visit: Payer: Self-pay | Admitting: Pharmacist

## 2016-09-24 MED ORDER — SARILUMAB 200 MG/1.14ML ~~LOC~~ SOSY: 1.0000 | PREFILLED_SYRINGE | SUBCUTANEOUS | 3 refills | Status: DC

## 2016-09-24 MED FILL — BENZONATATE 200 MG CAPSULE: 200 | 15 days supply | Qty: 45 | Fill #1

## 2016-09-24 MED FILL — FAMOTIDINE 20 MG TABLET: 20 | 30 days supply | Qty: 30 | Fill #1

## 2016-09-27 MED FILL — KEVZARA 200 MG/1.14ML SOSY: 200 | 28 days supply | Qty: 2 | Fill #0

## 2016-09-30 ENCOUNTER — Ambulatory Visit (INDEPENDENT_AMBULATORY_CARE_PROVIDER_SITE_OTHER): Payer: 59 | Admitting: Internal Medicine

## 2016-09-30 ENCOUNTER — Other Ambulatory Visit (INDEPENDENT_AMBULATORY_CARE_PROVIDER_SITE_OTHER): Payer: 59

## 2016-09-30 ENCOUNTER — Encounter: Payer: Self-pay | Admitting: Internal Medicine

## 2016-09-30 VITALS — BP 126/84 | HR 83 | Ht 68.0 in | Wt 209.0 lb

## 2016-09-30 DIAGNOSIS — R0609 Other forms of dyspnea: Secondary | ICD-10-CM | POA: Diagnosis not present

## 2016-09-30 DIAGNOSIS — R05 Cough: Secondary | ICD-10-CM

## 2016-09-30 DIAGNOSIS — R058 Other specified cough: Secondary | ICD-10-CM

## 2016-09-30 LAB — NITRIC OXIDE: NITRIC OXIDE: 8

## 2016-09-30 LAB — CBC WITH DIFFERENTIAL/PLATELET
BASOS ABS: 0 10*3/uL (ref 0.0–0.1)
Basophils Relative: 1 % (ref 0.0–3.0)
EOS ABS: 0.2 10*3/uL (ref 0.0–0.7)
Eosinophils Relative: 4.6 % (ref 0.0–5.0)
HCT: 42.1 % (ref 36.0–46.0)
Hemoglobin: 14 g/dL (ref 12.0–15.0)
LYMPHS ABS: 1.4 10*3/uL (ref 0.7–4.0)
Lymphocytes Relative: 35.6 % (ref 12.0–46.0)
MCHC: 33.2 g/dL (ref 30.0–36.0)
MCV: 102.3 fl — ABNORMAL HIGH (ref 78.0–100.0)
Monocytes Absolute: 0.6 10*3/uL (ref 0.1–1.0)
Monocytes Relative: 15.9 % — ABNORMAL HIGH (ref 3.0–12.0)
NEUTROS ABS: 1.6 10*3/uL (ref 1.4–7.7)
NEUTROS PCT: 42.9 % — AB (ref 43.0–77.0)
PLATELETS: 149 10*3/uL — AB (ref 150.0–400.0)
RBC: 4.11 Mil/uL (ref 3.87–5.11)
RDW: 13.8 % (ref 11.5–15.5)
WBC: 3.8 10*3/uL — ABNORMAL LOW (ref 4.0–10.5)

## 2016-09-30 MED ORDER — GABAPENTIN 100 MG PO CAPS: 100.0000 mg | ORAL_CAPSULE | Freq: Three times a day (TID) | ORAL | 2 refills | Status: DC

## 2016-09-30 MED FILL — GABAPENTIN 100 MG CAP: 100 | 30 days supply | Qty: 90 | Fill #0

## 2016-09-30 NOTE — Patient Instructions (Addendum)
Try adding gabapentin 100 mg three times a day and avoid clearing your throat as much as possbile   Please remember to go to the lab department downstairs in the basement  for your tests - we will call you with the results when they are available.  Please see patient coordinator before you leave today  to schedule sinus CT       Please schedule a follow up office visit in 6 weeks, call sooner if needed with pfts on return

## 2016-09-30 NOTE — Assessment & Plan Note (Addendum)
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 so need to complete the w/u and in meantime try gabapentin low doses until returns for pfts  Discussed in detail all the  indications, usual  risks and alternatives  relative to the benefits with patient who agrees to proceed with conservative f/u as outlined    I had an extended discussion with the patient reviewing all relevant studies completed to date and  lasting 15 to 20 minutes of a 25 minute visit    Each maintenance medication was reviewed in detail including most importantly the difference between maintenance and prns and under what circumstances the prns are to be triggered using an action plan format that is not reflected in the computer generated alphabetically organized AVS.    Please see AVS for specific instructions unique to this visit that I personally wrote and verbalized to the the pt in detail and then reviewed with pt  by my nurse highlighting any  changes in therapy recommended at today's visit to their plan of care.

## 2016-09-30 NOTE — Progress Notes (Signed)
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 ILD related to RA or mtx   History of Present Illness  08/29/2016 1st [facility] office visit/ Olivia Rubio   Chief Complaint  Patient presents with  . Advice Only    referred for chronic cough.    seems to get better as soon as head hits pillow and doesn't wake her up but right after first drinks am water starts up again  Assoc urinary incont and sometimes to point of gagging but not vomiting   Kouffman Reflux v Neurogenic Cough Differentiator Reflux Comments  Do you awaken from a sound sleep coughing violently?                            With trouble breathing? no   Do you have choking episodes when you cannot  Get enough air, gasping for air ?              no   Do you usually cough when you lie down into  The bed, or when you just lie down to rest ?                          no   Do you usually cough after meals or eating?         Yes, no pattern   Do you cough when (or after) you bend over?    no   GERD SCORE     Kouffman Reflux v Neurogenic Cough Differentiator Neurogenic   Do you more-or-less cough all day long? yes   Does change of temperature make you cough? no   Does laughing or chuckling cause you to cough? no   Do fumes (perfume, automobile fumes, burned  Toast, etc.,) cause you to cough ?      no   Does speaking, singing, or talking on the phone cause you to cough   ?               Singing    Neurogenic/Airway  score     rec Pantoprazole (protonix) 40 mg   Take  30-60 min before first meal of the day and Pepcid (famotidine)  20 mg one @  bedtime until return to office   For drainage / throat tickle try take CHLORPHENIRAMINE  4 mg - take one every 4 hours as needed - available over the counter- may cause drowsiness so start with just a bedtime dose or two and see how you tolerate it before trying in daytime  For throat clearing >  Tessalon pearls 200 mg every 6 hours as needed  GERD diet     09/30/2016  f/u ov/Olivia Rubio re: UACS on pred 5mg  daily for RA /max gerd rx/   Chief Complaint  Patient presents with  . Follow-up  Cough is much improved. Cough is non prod most of the time-rarely produces very minimal yellow sputum. She is still clearing her throat, but not as often.   only using h1 at hs x one pill MMRC1 = can walk nl pace, flat grade, can't hurry or go uphills or steps s sob    No obvious day to day or daytime variability or assoc excess/ purulent sputum or mucus plugs or hemoptysis or cp or chest tightness, subjective wheeze or overt   hb symptoms. No unusual exp hx or h/o childhood pna/ asthma or knowledge of premature birth.  Sleeping ok flat without nocturnal  or early am exacerbation  of respiratory  c/o's or need for noct saba. Also denies any obvious fluctuation of symptoms with weather or environmental changes or other aggravating or alleviating factors except as outlined above   Current Allergies, Complete Past Medical History, Past Surgical History, Family History, and Social History were reviewed in [employer] record.  ROS  The following are not active complaints unless bolded sore throat, dysphagia, dental problems, itching, sneezing,  nasal congestion or disharge of excess mucus or purulent secretions, ear ache,   fever, chills, sweats, unintended wt loss or wt gain, classically pleuritic or exertional cp,  orthopnea pnd or leg swelling, presyncope,  palpitations, abdominal pain, anorexia, nausea, vomiting, diarrhea  or change in bowel habits or bladder habits, change in stools or change in urine, dysuria, hematuria,  rash, arthralgias, visual complaints, headache, numbness, weakness or ataxia or problems with walking or coordination,  change in mood/affect or memory.           Current Meds  Medication Sig  . benzonatate (TESSALON) 200 MG capsule Take 1 capsule (200 mg total) by mouth 3 (three) times daily as needed for cough.  . chlorpheniramine (CHLOR-TRIMETON) 4 MG tablet Take 4 mg by mouth every 4 (four) hours as needed for allergies.  . famotidine (PEPCID) 20 MG tablet One at bedtime  . folic acid (FOLVITE) 1 MG tablet Take 1 mg by mouth 2 (two) times daily.   Marland Kitchen leflunomide (ARAVA) 20 MG tablet Take 20 mg by mouth daily.  . methotrexate 2.5 MG tablet Takes 4 pills per dosage.  . pantoprazole (PROTONIX) 40 MG tablet Take 40 mg by mouth daily.    . predniSONE (DELTASONE) 5 MG tablet Take 5 mg by mouth daily with breakfast.   . Sarilumab (KEVZARA) 200 MG/1.14ML SOSY Inject 1 Syringe into the skin every 14 (fourteen) days.                 Objective:   Physical Exam  amb wf  Intermittent vigorous  throat clearing    09/30/2016      209   08/29/16 210 lb (95.3 kg)  08/26/16 205 lb (93 kg)  01/01/12 205 lb 8 oz (93.2 kg)    Vital signs reviewed  - Note on arrival 02 sats  95% on RA      HEENT: nl dentition,  and oropharynx. Nl external ear canals without cough reflex - moderate bilateral L > R non-specific turbinate edema     NECK :  without JVD/Nodes/TM/ nl carotid upstrokes bilaterally   LUNGS: no acc muscle use,  Nl contour chest which is clear to A and P bilaterally without cough on insp or exp maneuvers   CV:  RRR  no s3 or murmur or increase in P2, and no edema   ABD:  soft and nontender with nl inspiratory  excursion in the supine position. No bruits or organomegaly appreciated, bowel sounds nl  MS:  Nl  gait/ ext warm without deformities, calf tenderness, cyanosis or clubbing No obvious joint restrictions   SKIN: warm and dry without lesions    NEURO:  alert, approp, nl sensorium with  no motor or cerebellar deficits apparent.      Labs ordered 09/30/2016   Allergy profile         Assessment & Plan:

## 2016-10-01 LAB — RESPIRATORY ALLERGY PROFILE REGION II ~~LOC~~
Allergen, A. alternata, m6: <0.1 kU/L
Allergen, Comm Silver Birch, t9: <0.1 kU/L
Allergen, Cottonwood, t14: <0.1 kU/L
Allergen, Mouse Urine Protein, e78: <0.1 kU/L
Allergen, P. notatum, m1: <0.1 kU/L
Bermuda Grass: <0.1 kU/L
Box Elder IgE: <0.1 kU/L
CLADOSPORIUM HERBARUM (M2) IGE: <0.1 kU/L
CLASS: 0
CLASS: 0
CLASS: 0
CLASS: 0
CLASS: 0
CLASS: 0
CLASS: 0
CLASS: 0
CLASS: 0
CLASS: 0
CLASS: 0
CLASS: 0
CLASS: 0
CLASS: 0
CLASS: 0
CLASS: 0
COMMON RAGWEED (SHORT) (W1) IGE: <0.1 kU/L
Class: 0
Class: 0
Class: 0
Class: 0
Class: 0
Class: 0
Class: 0
Class: 0
Elm IgE: <0.1 kU/L
IgE (Immunoglobulin E), Serum: 44 kU/L (ref <=114)
Johnson Grass: <0.1 kU/L
Pecan/Hickory Tree IgE: <0.1 kU/L
Sheep Sorrel IgE: <0.1 kU/L
Timothy Grass: <0.1 kU/L

## 2016-10-01 LAB — INTERPRETATION:

## 2016-10-01 NOTE — Assessment & Plan Note (Signed)
09/30/2016  Walked RA x 3 laps @ 185 ft each stopped due to  End of study, fast pace, no sob or desat    Not able to reproduce this chronic complaint in office setting but excludes significant ILD from RA or MTX at this point - does need baseline pfts on  Return to monitor for future purposes

## 2016-10-02 ENCOUNTER — Telehealth: Payer: Self-pay | Admitting: Internal Medicine

## 2016-10-02 NOTE — Telephone Encounter (Signed)
Spoke with pt about lab results. She understood and nothing further is needed.

## 2016-10-04 ENCOUNTER — Telehealth: Payer: Self-pay | Admitting: Family Medicine

## 2016-10-04 NOTE — Telephone Encounter (Signed)
Rep from Med review called requesting information on medication Kevzara ,medication needs prior auth and they are needing more information. Please f/up

## 2016-10-07 NOTE — Telephone Encounter (Signed)
We are not responsible for the prior authorization for this medication. The pharmacy sent it to [place] by mistake but it has been sent to the appropriate office. Will disregard.

## 2016-10-10 ENCOUNTER — Ambulatory Visit (INDEPENDENT_AMBULATORY_CARE_PROVIDER_SITE_OTHER)
Admission: RE | Admit: 2016-10-10 | Discharge: 2016-10-10 | Disposition: A | Discharge status: Home / Self Care | Payer: 59 | Source: Ambulatory Visit | Attending: Internal Medicine | Admitting: Internal Medicine

## 2016-10-10 DIAGNOSIS — R05 Cough: Secondary | ICD-10-CM

## 2016-10-10 DIAGNOSIS — R058 Other specified cough: Secondary | ICD-10-CM

## 2016-10-11 ENCOUNTER — Telehealth: Payer: Self-pay | Admitting: Internal Medicine

## 2016-10-11 NOTE — Telephone Encounter (Signed)
  Notes recorded by Tanda Rockers, MD on 10/10/2016 at 8:20 PM EDT Call patient : Study is unremarkable, no change in recs  Advised pt of results. Pt understood and nothing further is needed.

## 2016-10-11 NOTE — Progress Notes (Signed)
LMTCB

## 2016-10-15 DIAGNOSIS — K219 Gastro-esophageal reflux disease without esophagitis: Secondary | ICD-10-CM | POA: Diagnosis not present

## 2016-10-22 MED FILL — KEVZARA 200 MG/1.14ML SOSY: 200 | 28 days supply | Qty: 2 | Fill #1

## 2016-10-24 ENCOUNTER — Other Ambulatory Visit: Payer: Self-pay | Admitting: Obstetrics and Gynecology

## 2016-10-24 DIAGNOSIS — Z1231 Encounter for screening mammogram for malignant neoplasm of breast: Secondary | ICD-10-CM

## 2016-10-24 MED FILL — predniSONE 5 MG TABS: 5 | 90 days supply | Qty: 90 | Fill #0

## 2016-10-24 MED FILL — FAMOTIDINE 20 MG TABLET: 20 | 30 days supply | Qty: 30 | Fill #2

## 2016-10-24 MED FILL — PANTOPRAZOLE SOD DR 40 MG T: 40 | 90 days supply | Qty: 90 | Fill #3

## 2016-10-24 MED FILL — LEFLUNOMIDE 20 MG TABLET: 20 | 30 days supply | Qty: 30 | Fill #0

## 2016-10-24 MED FILL — BENZONATATE 200 MG CAPSULE: 200 | 15 days supply | Qty: 45 | Fill #2

## 2016-11-12 ENCOUNTER — Encounter: Payer: Self-pay | Admitting: Internal Medicine

## 2016-11-12 ENCOUNTER — Ambulatory Visit (INDEPENDENT_AMBULATORY_CARE_PROVIDER_SITE_OTHER): Payer: 59 | Admitting: Internal Medicine

## 2016-11-12 VITALS — BP 128/82 | HR 86 | Ht 68.0 in | Wt 206.0 lb

## 2016-11-12 DIAGNOSIS — R05 Cough: Secondary | ICD-10-CM | POA: Diagnosis not present

## 2016-11-12 DIAGNOSIS — R058 Other specified cough: Secondary | ICD-10-CM

## 2016-11-12 DIAGNOSIS — R0609 Other forms of dyspnea: Secondary | ICD-10-CM

## 2016-11-12 DIAGNOSIS — Z23 Encounter for immunization: Secondary | ICD-10-CM | POA: Diagnosis not present

## 2016-11-12 NOTE — Assessment & Plan Note (Signed)
09/30/2016  Walked RA x 3 laps @ 185 ft each stopped due to  End of study, fast pace, no sob or desat     No evidence of ild or any other complication of RA based on exam or cxr but needs pfts to be complete > re-ordered

## 2016-11-12 NOTE — Patient Instructions (Addendum)
For drainage / throat tickle >>>   CHLORPHENIRAMINE  4 mg - take one every 4 hours as needed - available over the counter- may cause drowsiness so start with just a bedtime dose or two and see how you tolerate it before trying in daytime  For cough >>>Tessalon up 200 mg every 8 hours to completely 100% suppress the urge to clear your throat  Gabapentin 100 mg x 2 in am and then again around supper time     Please schedule a follow up office visit in 6 weeks, call sooner if needed with pfts on return

## 2016-11-12 NOTE — Assessment & Plan Note (Signed)
Trial of max rx for gerd and pnds (1st gen1) and tessalon 08/29/2016 >>> much better 09/30/2016 but still throat clearing so try gabapentin 100 mg tid  - FENO 09/30/2016  =   8 on pred 5 mg daily  - Allergy profile 09/30/2016 >  Eos 0.2 /  IgE  44/ neg RAST - sinus CT 10/10/2016 > neg   - 11/12/2016 increased gabapentin to 200 mg bid from 100 mg bid   Not controlling cyclical cough effectively > rec tessalon and 1st gen H1 blockers per guidelines    Needs f/u pfts as well    I had an extended discussion with the patient reviewing all relevant studies completed to date and  lasting 15 to 20 minutes of a 25 minute visit    Each maintenance medication was reviewed in detail including most importantly the difference between maintenance and prns and under what circumstances the prns are to be triggered using an action plan format that is not reflected in the computer generated alphabetically organized AVS.    Please see AVS for specific instructions unique to this visit that I personally wrote and verbalized to the the pt in detail and then reviewed with pt  by my nurse highlighting any  changes in therapy recommended at today's visit to their plan of care.

## 2016-11-12 NOTE — Progress Notes (Signed)
Subjective:    Patient ID: Olivia Rubio, female    DOB: 1955-06-18,    MRN: [id number]    Brief patient profile:  38 yowf never smoker works as  Actuary for the elderly was perfectly healthy until  around age  61 developed chronic cough attributed to reflux by ent  as had overt HB rx with pantoprazole which fixed the HB but not the cough  then dx RA around 2012 on prednisone and after rx with mtx cough worse and mtx tapered down but not off then cough worsed again around 02/2016 with constant throat clearing so referred to pulmonary clinic 08/29/2016 by [name] ? ILD related to RA or mtx    History of Present Illness  08/29/2016 1st [facility] office visit/ Abcde Oneil   Chief Complaint  Patient presents with  . Advice Only    referred for chronic cough.    seems to get better as soon as head hits pillow and doesn't wake her up but right after first drinks am water starts up again  Assoc urinary incont and sometimes to point of gagging but not vomiting   Kouffman Reflux v Neurogenic Cough Differentiator Reflux Comments  Do you awaken from a sound sleep coughing violently?                            With trouble breathing? no   Do you have choking episodes when you cannot  Get enough air, gasping for air ?              no   Do you usually cough when you lie down into  The bed, or when you just lie down to rest ?                          no   Do you usually cough after meals or eating?         Yes, no pattern   Do you cough when (or after) you bend over?    no   GERD SCORE     Kouffman Reflux v Neurogenic Cough Differentiator Neurogenic   Do you more-or-less cough all day long? yes   Does change of temperature make you cough? no   Does laughing or chuckling cause you to cough? no   Do fumes (perfume, automobile fumes, burned  Toast, etc.,) cause you to cough ?      no   Does speaking, singing, or talking on the phone cause you to cough   ?               Singing      Neurogenic/Airway score     rec Pantoprazole (protonix) 40 mg   Take  30-60 min before first meal of the day and Pepcid (famotidine)  20 mg one @  bedtime until return to office   For drainage / throat tickle try take CHLORPHENIRAMINE  4 mg - take one every 4 hours as needed - available over the counter- may cause drowsiness so start with just a bedtime dose or two and see how you tolerate it before trying in daytime  For throat clearing >  Tessalon pearls 200 mg every 6 hours as needed  GERD diet     09/30/2016  f/u ov/[name] re: UACS on pred 5mg  daily for RA /max gerd rx/   Chief Complaint  Patient presents with  .  Follow-up    Cough is much improved. Cough is non prod most of the time-rarely produces very minimal yellow sputum. She is still clearing her throat, but not as often.   only using h1 at hs x one pill MMRC1 = can walk nl pace, flat grade, can't hurry or go uphills or steps s sob   rec Try adding gabapentin 100 mg three times a day and avoid clearing your throat as much as possbile  Please remember to go to the lab department downstairs in the basement  for your tests - we will call you with the results when they are available. Please see patient coordinator before you leave today  to schedule sinus CT  Please schedule a follow up office visit in 6 weeks, call sooner if needed with pfts on return      11/12/2016  f/u ov/[name] re: cough x age 61  Chief Complaint  Patient presents with  . Follow-up    Pt has dry cough, and clearing the throat on going since last visit.Pt requests flu shot today  gabapentin 100 bid  Cough never disturbs sleep Doe limited by R ankle  arthritis variably poor control esp MCP stiffness in am/ on rx per rheum with avava plus sarilumab injections   No obvious day to day or daytime variability or assoc excess/ purulent sputum or mucus plugs or hemoptysis or cp or chest tightness, subjective wheeze or overt sinus or hb symptoms. No unusual exp hx  or h/o childhood pna/ asthma or knowledge of premature birth.  Sleeping ok flat without nocturnal  or early am exacerbation  of respiratory  c/o's or need for noct saba. Also denies any obvious fluctuation of symptoms with weather or environmental changes or other aggravating or alleviating factors except as outlined above   Current Allergies, Complete Past Medical History, Past Surgical History, Family History, and Social History were reviewed in [employer] record.  ROS  The following are not active complaints unless bolded Hoarseness, sore throat, dysphagia, dental problems, itching, sneezing,  nasal congestion or discharge of excess mucus or purulent secretions, ear ache,   fever, chills, sweats, unintended wt loss or wt gain, classically pleuritic or exertional cp,  orthopnea pnd or leg swelling, presyncope, palpitations, abdominal pain, anorexia, nausea, vomiting, diarrhea  or change in bowel habits or change in bladder habits, change in stools or change in urine, dysuria, hematuria,  rash, arthralgias, visual complaints, headache, numbness, weakness or ataxia or problems with walking or coordination,  change in mood/affect or memory.        Current Meds  Medication Sig  . benzonatate (TESSALON) 200 MG capsule Take 1 capsule (200 mg total) by mouth 3 (three) times daily as needed for cough.  . chlorpheniramine (CHLOR-TRIMETON) 4 MG tablet Take 4 mg by mouth every 4 (four) hours as needed for allergies.  . famotidine (PEPCID) 20 MG tablet One at bedtime  . folic acid (FOLVITE) 1 MG tablet Take 1 mg by mouth 2 (two) times daily.   Marland Kitchen gabapentin (NEURONTIN) 100 MG capsule Take 1 capsule (100 mg total) by mouth 3 (three) times daily. One three times daily  . leflunomide (ARAVA) 20 MG tablet Take 20 mg by mouth daily.  . methotrexate 2.5 MG tablet Takes 4 pills per dosage.  . pantoprazole (PROTONIX) 40 MG tablet Take 40 mg by mouth daily.    . predniSONE (DELTASONE) 5 MG  tablet Take 5 mg by mouth daily with breakfast.   .  Sarilumab (KEVZARA) 200 MG/1.14ML SOSY Inject 1 Syringe into the skin every 14 (fourteen) days.                 Objective:   Physical Exam  amb wf  vigorous continuous throat clearing    09/30/2016      209   08/29/16 210 lb (95.3 kg)  08/26/16 205 lb (93 kg)  01/01/12 205 lb 8 oz (93.2 kg)    Vital signs reviewed  - Note on arrival 02 sats  95% on RA      HEENT: nl dentition,  and oropharynx. Nl external ear canals without cough reflex - moderate bilateral L > R non-specific turbinate edema     NECK :  without JVD/Nodes/TM/ nl carotid upstrokes bilaterally   LUNGS: no acc muscle use,  Nl contour chest which is clear to A and P bilaterally without cough on insp or exp maneuvers   CV:  RRR  no s3 or murmur or increase in P2, and no edema   ABD:  soft and nontender with nl inspiratory excursion in the supine position. No bruits or organomegaly appreciated, bowel sounds nl  MS:  Nl gait/ ext warm without deformities, calf tenderness, cyanosis or clubbing No obvious joint restrictions   SKIN: warm and dry without lesions    NEURO:  alert, approp, nl sensorium with  no motor or cerebellar deficits apparent.              Assessment & Plan:

## 2016-11-12 NOTE — Addendum Note (Signed)
Addended by: Georjean Mode on: 11/12/2016 09:35 AM   Modules accepted: Orders

## 2016-11-20 ENCOUNTER — Other Ambulatory Visit: Payer: Self-pay | Admitting: Internal Medicine

## 2016-11-20 DIAGNOSIS — R05 Cough: Secondary | ICD-10-CM

## 2016-11-20 DIAGNOSIS — R058 Other specified cough: Secondary | ICD-10-CM

## 2016-11-20 MED FILL — FAMOTIDINE 20 MG TABLET: 20 | 30 days supply | Qty: 30 | Fill #0

## 2016-11-20 MED FILL — KEVZARA 200 MG/1.14ML SOSY: 200 | 28 days supply | Qty: 2 | Fill #2

## 2016-11-20 MED FILL — LEFLUNOMIDE 20 MG TABLET: 20 | 30 days supply | Qty: 30 | Fill #1

## 2016-11-20 MED FILL — BENZONATATE 200 MG CAPSULE: 200 | 15 days supply | Qty: 45 | Fill #0

## 2016-11-20 MED FILL — GABAPENTIN 100 MG CAP: 100 | 30 days supply | Qty: 90 | Fill #1

## 2016-12-12 ENCOUNTER — Telehealth: Payer: Self-pay | Admitting: Internal Medicine

## 2016-12-12 MED ORDER — GABAPENTIN 300 MG PO CAPS: 300.0000 mg | ORAL_CAPSULE | Freq: Two times a day (BID) | ORAL | 3 refills | Status: DC

## 2016-12-12 MED FILL — GABAPENTIN 300 MG CAPSULE: 300 | 30 days supply | Qty: 60 | Fill #0

## 2016-12-12 MED FILL — FOLIC ACID 1 MG TABLET: 1 | 90 days supply | Qty: 270 | Fill #2

## 2016-12-12 NOTE — Telephone Encounter (Signed)
ATC pt, no answer. Left message for pt to call back.  

## 2016-12-12 NOTE — Telephone Encounter (Signed)
Patient returned called - she can be reached at [phone] -pr

## 2016-12-12 NOTE — Telephone Encounter (Signed)
Called pt and advised message from the provider. Pt understood and verbalized understanding. Nothing further is needed.   Rx sent to the pharmacy.  

## 2016-12-12 NOTE — Telephone Encounter (Signed)
Per 10.30.18 ov with MW: Patient Instructions  For drainage / throat tickle >>>   CHLORPHENIRAMINE  4 mg - take one every 4 hours as needed - available over the counter- may cause drowsiness so start with just a bedtime dose or two and see how you tolerate it before trying in daytime   For cough >>>Tessalon up 200 mg every 8 hours to completely 100% suppress the urge to clear your throat   Gabapentin 100 mg x 2 in am and then again around supper time      Please schedule a follow up office visit in 6 weeks, call sooner if needed with pfts on return     Called spoke with patient who reported that she went to pick up her Gabapentin Rx but the directions are still for 100mg  TID.  Will be happy to send in updated Rx for patient, however she did mention that she is still VERY hoarse in the mornings that does lessen throughout the day but is always present.  Her throat clearing has decreased with the increased Gabapentin.  Dr Melvyn Novas, patient wanted to check to make sure the Gabapentin does/does not need to be adjusted again given her persistent symptoms.  Thank you.

## 2016-12-12 NOTE — Telephone Encounter (Signed)
If improving then try gabapentin 300 bid and change the rx to the 300 mg tablet at next refill

## 2016-12-13 DIAGNOSIS — M25561 Pain in right knee: Secondary | ICD-10-CM | POA: Diagnosis not present

## 2016-12-13 DIAGNOSIS — M2550 Pain in unspecified joint: Secondary | ICD-10-CM | POA: Diagnosis not present

## 2016-12-13 DIAGNOSIS — M150 Primary generalized (osteo)arthritis: Secondary | ICD-10-CM | POA: Diagnosis not present

## 2016-12-13 DIAGNOSIS — Z6831 Body mass index (BMI) 31.0-31.9, adult: Secondary | ICD-10-CM | POA: Diagnosis not present

## 2016-12-13 DIAGNOSIS — M0589 Other rheumatoid arthritis with rheumatoid factor of multiple sites: Secondary | ICD-10-CM | POA: Diagnosis not present

## 2016-12-13 DIAGNOSIS — Z79899 Other long term (current) drug therapy: Secondary | ICD-10-CM | POA: Diagnosis not present

## 2016-12-13 DIAGNOSIS — M545 Low back pain: Secondary | ICD-10-CM | POA: Diagnosis not present

## 2016-12-13 DIAGNOSIS — R05 Cough: Secondary | ICD-10-CM | POA: Diagnosis not present

## 2016-12-13 DIAGNOSIS — S92901D Unspecified fracture of right foot, subsequent encounter for fracture with routine healing: Secondary | ICD-10-CM | POA: Diagnosis not present

## 2016-12-16 MED FILL — KEVZARA 200 MG/1.14ML SOSY: 200 | 28 days supply | Qty: 2 | Fill #3

## 2016-12-16 MED FILL — LEFLUNOMIDE 20 MG TABLET: 20 | 30 days supply | Qty: 30 | Fill #0

## 2016-12-16 MED FILL — BENZONATATE 200 MG CAPSULE: 200 | 15 days supply | Qty: 45 | Fill #1

## 2016-12-24 ENCOUNTER — Ambulatory Visit: Payer: 59 | Admitting: Internal Medicine

## 2017-01-03 DIAGNOSIS — J40 Bronchitis, not specified as acute or chronic: Secondary | ICD-10-CM | POA: Diagnosis not present

## 2017-01-03 DIAGNOSIS — L989 Disorder of the skin and subcutaneous tissue, unspecified: Secondary | ICD-10-CM | POA: Diagnosis not present

## 2017-01-03 MED FILL — AZITHROMYCIN 250 MG TABLET: 250 | 5 days supply | Qty: 6 | Fill #0

## 2017-01-03 MED FILL — PROMETHAZINE W/COD SYRUP: 6.25-10 | 5 days supply | Qty: 100 | Fill #0

## 2017-01-15 MED FILL — predniSONE 5 MG TABS: 5 | 90 days supply | Qty: 90 | Fill #0

## 2017-01-15 MED FILL — PANTOPRAZOLE SOD DR 40 MG T: 40 | 90 days supply | Qty: 90 | Fill #0

## 2017-01-15 MED FILL — LEFLUNOMIDE 20 MG TABLET: 20 | 30 days supply | Qty: 30 | Fill #0

## 2017-01-20 ENCOUNTER — Other Ambulatory Visit: Payer: Self-pay | Admitting: Pharmacist

## 2017-01-20 MED ORDER — SARILUMAB 200 MG/1.14ML ~~LOC~~ SOSY: 1.0000 | PREFILLED_SYRINGE | SUBCUTANEOUS | 3 refills | Status: DC

## 2017-01-20 MED FILL — KEVZARA 200 MG/1.14ML SOSY: 200 | 28 days supply | Qty: 2 | Fill #0

## 2017-01-24 ENCOUNTER — Encounter: Payer: Self-pay | Admitting: Internal Medicine

## 2017-01-24 ENCOUNTER — Ambulatory Visit: Payer: 59 | Admitting: Internal Medicine

## 2017-01-24 ENCOUNTER — Ambulatory Visit (INDEPENDENT_AMBULATORY_CARE_PROVIDER_SITE_OTHER): Payer: 59 | Admitting: Internal Medicine

## 2017-01-24 VITALS — BP 118/64 | HR 104 | Ht 68.0 in | Wt 214.0 lb

## 2017-01-24 DIAGNOSIS — R0609 Other forms of dyspnea: Secondary | ICD-10-CM

## 2017-01-24 DIAGNOSIS — R058 Other specified cough: Secondary | ICD-10-CM

## 2017-01-24 DIAGNOSIS — R05 Cough: Secondary | ICD-10-CM | POA: Diagnosis not present

## 2017-01-24 LAB — PULMONARY FUNCTION TEST
DL/VA % PRED: 102 %
DL/VA: 5.41 ml/min/mmHg/L
DLCO COR % PRED: 85 %
DLCO COR: 25.25 ml/min/mmHg
DLCO unc % pred: 84 %
DLCO unc: 25.02 ml/min/mmHg
FEF 25-75 POST: 3.81 L/s
FEF 25-75 Pre: 3.14 L/sec
FEF2575-%CHANGE-POST: 21 %
FEF2575-%PRED-PRE: 122 %
FEF2575-%Pred-Post: 148 %
FEV1-%Change-Post: 4 %
FEV1-%Pred-Post: 93 %
FEV1-%Pred-Pre: 89 %
FEV1-Post: 2.74 L
FEV1-Pre: 2.63 L
FEV1FVC-%CHANGE-POST: 1 %
FEV1FVC-%Pred-Pre: 108 %
FEV6-%CHANGE-POST: 2 %
FEV6-%PRED-PRE: 84 %
FEV6-%Pred-Post: 87 %
FEV6-PRE: 3.1 L
FEV6-Post: 3.19 L
FEV6FVC-%PRED-PRE: 104 %
FEV6FVC-%Pred-Post: 104 %
FVC-%CHANGE-POST: 2 %
FVC-%PRED-POST: 83 %
FVC-%Pred-Pre: 81 %
FVC-PRE: 3.1 L
FVC-Post: 3.19 L
POST FEV1/FVC RATIO: 86 %
POST FEV6/FVC RATIO: 100 %
PRE FEV1/FVC RATIO: 85 %
Pre FEV6/FVC Ratio: 100 %
RV % pred: 94 %
RV: 2.08 L
TLC % pred: 87 %
TLC: 4.95 L

## 2017-01-24 MED ORDER — GABAPENTIN 300 MG PO CAPS: 300.0000 mg | ORAL_CAPSULE | Freq: Three times a day (TID) | ORAL | 0 refills | Status: DC

## 2017-01-24 MED FILL — GABAPENTIN 300 MG CAPSULE: 300 | 30 days supply | Qty: 90 | Fill #0

## 2017-01-24 NOTE — Patient Instructions (Signed)
Increase gabapentin to 300 mg bfast, supper, bedtime   Try off the chlorpheniramine   Please schedule a follow up office visit in 6 weeks, call sooner if needed with all medications /inhalers/ solutions in hand so we can verify exactly what you are taking. This includes all medications from all doctors and over the counters

## 2017-01-24 NOTE — Progress Notes (Signed)
A user error has taken place.

## 2017-01-24 NOTE — Progress Notes (Signed)
PFT completed today 01/24/17

## 2017-01-24 NOTE — Progress Notes (Signed)
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 ILD related to RA or mtx    History of Present Illness  08/29/2016 1st [facility] office visit/ Roby Donaway   Chief Complaint  Patient presents with  . Advice Only    referred for chronic cough.    seems to get better as soon as head hits pillow and doesn't wake her up but right after first drinks am water starts up again  Assoc urinary incont and sometimes to point of gagging but not vomiting   Kouffman Reflux v Neurogenic Cough Differentiator Reflux Comments  Do you awaken from a sound sleep coughing violently?                            With trouble breathing? no   Do you have choking episodes when you cannot  Get enough air, gasping for air ?              no   Do you usually cough when you lie down into  The bed, or when you just lie down to rest ?                          no   Do you usually cough after meals or eating?         Yes, no pattern   Do you cough when (or after) you bend over?    no   GERD SCORE     Kouffman Reflux v Neurogenic Cough Differentiator Neurogenic   Do you more-or-less cough all day long? yes   Does change of temperature make you cough? no   Does laughing or chuckling cause you to cough? no   Do fumes (perfume, automobile fumes, burned  Toast, etc.,) cause you to cough ?      no   Does speaking, singing, or talking on the phone cause you to cough   ?               Singing      Neurogenic/Airway score     rec Pantoprazole (protonix) 40 mg   Take  30-60 min before first meal of the day and Pepcid (famotidine)  20 mg one @  bedtime until return to office   For drainage / throat tickle try take CHLORPHENIRAMINE  4 mg - take one every 4 hours as needed - available over the counter- may cause drowsiness so start with just a bedtime dose or two and see how you tolerate it before trying in daytime  For throat clearing >  Tessalon pearls 200 mg every 6 hours as needed  GERD diet     09/30/2016  f/u ov/Esma Kilts re: UACS on pred 5mg  daily for RA /max gerd rx/   Chief Complaint  Patient presents with  .  Follow-up    Cough is much improved. Cough is non prod most of the time-rarely produces very minimal yellow sputum. She is still clearing her throat, but not as often.   only using h1 at hs x one pill MMRC1 = can walk nl pace, flat grade, can't hurry or go uphills or steps s sob   rec Try adding gabapentin 100 mg three times a day and avoid clearing your throat as much as possbile  Please remember to go to the lab department downstairs in the basement  for your tests - we will call you with the results when they are available. Please see patient coordinator before you leave today  to schedule sinus CT  Please schedule a follow up office visit in 6 weeks, call sooner if needed with pfts on return      11/12/2016  f/u ov/Banyan Goodchild re: cough x age 62  Chief Complaint  Patient presents with  . Follow-up    Pt has dry cough, and clearing the throat on going since last visit.Pt requests flu shot today  gabapentin 100 bid  Cough never disturbs sleep Doe limited by R ankle  arthritis variably poor control esp MCP stiffness in am/ on rx per rheum with arava plus sarilumab injections rec For drainage / throat tickle >>>   CHLORPHENIRAMINE  4 mg - take one every 4 hours as needed - available over the counter- may cause drowsiness so start with just a bedtime dose or two and see how you  tolerate it before trying in daytime For cough >>>Tessalon up 200 mg every 8 hours to completely 100% suppress the urge to clear your throat Gabapentin 100 mg x 2 in am and then again around supper time     01/24/2017  f/u ov/Loukas Antonson re: cough since age 62, flared since 02/2016  Chief Complaint  Patient presents with  . Follow-up    follow up PFT, SOB with activity,cough with yellow sputum,has feet swelling in the evening,feels cough is different and not improving  wakes up feeling fine then sporadic min prod cough as day goes on, was doing some better with 300 mg bid gabapentin and h1 bid but the combination made her a bit "dizzy" and not sure which one is the cause  Tessalon also helps some  Doe = MMRC1 = can walk nl pace, flat grade, can't hurry or go uphills or steps s sob   Arthritis better   No obvious day to day or daytime variability or assoc   mucus plugs or hemoptysis or cp or chest tightness, subjective wheeze or overt sinus or hb symptoms. No unusual exposure hx or h/o childhood pna/ asthma or knowledge of premature birth.  Sleeping ok flat without nocturnal  or early am exacerbation  of respiratory  c/o's or need for noct saba. Also denies any obvious fluctuation of symptoms with weather or environmental changes or other aggravating or alleviating factors except as outlined above   Current Allergies, Complete Past Medical History, Past Surgical History, Family History, and Social History were reviewed in [employer] record.  ROS  The following are not active complaints unless bolded Hoarseness, sore throat, dysphagia, dental problems, itching, sneezing,  nasal congestion or discharge of excess mucus or purulent secretions, ear ache,   fever, chills, sweats, unintended wt loss or wt gain, classically pleuritic or exertional cp,  orthopnea pnd or leg swelling, presyncope, palpitations, abdominal pain, anorexia, nausea, vomiting, diarrhea  or change in bowel habits  or  change in bladder habits, change in stools or change in urine, dysuria, hematuria,  rash, arthralgias, visual complaints, headache, numbness, weakness or ataxia or problems with walking or coordination,  change in mood/affect or memory.        Current Meds  Medication Sig  . benzonatate (TESSALON) 200 MG capsule TAKE 1 CAPSULE (200 MG TOTAL) BY MOUTH 3 (THREE) TIMES DAILY AS NEEDED FOR COUGH.  . chlorpheniramine (CHLOR-TRIMETON) 4 MG tablet Take 4 mg by mouth every 6 (six) hours as needed for allergies.   . famotidine (PEPCID) 20 MG tablet TAKE 1 TABLET BY MOUTH AT BEDTIME  . folic acid (FOLVITE) 1 MG tablet Take 1 mg by mouth 3 (three) times daily.   Marland Kitchen gabapentin (NEURONTIN) 300 MG capsule Take 1 capsule (300 mg total) by mouth 3 (three) times daily.  Marland Kitchen leflunomide (ARAVA) 20 MG tablet Take 20 mg by mouth daily.  . methotrexate 2.5 MG tablet Takes 4 pills per dosage.  . pantoprazole (PROTONIX) 40 MG tablet Take 40 mg by mouth daily.    . predniSONE (DELTASONE) 5 MG tablet Take 5 mg by mouth daily with breakfast.   . Sarilumab (KEVZARA) 200 MG/1.14ML SOSY Inject 1 Syringe into the skin every 14 (fourteen) days.  . [DISCONTINUED] gabapentin (NEURONTIN) 100 MG capsule Take 1 capsule (100 mg total) by mouth 3 (three) times daily. One three times daily (Patient taking differently: Take 300 mg by mouth 2 (two) times daily. One three times daily)  . [ ]  gabapentin (NEURONTIN) 300 MG capsule Take 1 capsule (300 mg total) by mouth 2 (two) times daily.                  Objective:   Physical Exam  amb wf with freq throat clearing   01/24/2017       214   09/30/2016      209   08/29/16 210 lb (95.3 kg)  08/26/16 205 lb (93 kg)  01/01/12 205 lb 8 oz (93.2 kg)     Vital signs reviewed - Note on arrival 02 sats  96% on RA   HEENT: nl dentition, turbinates bilaterally, and oropharynx which is pristine. Nl external ear canals without cough reflex   NECK :  without JVD/Nodes/TM/ nl carotid  upstrokes bilaterally   LUNGS: no acc muscle use,  Nl contour chest which is clear to A and P bilaterally without cough on insp or exp maneuvers   CV:  RRR  no s3 or murmur or increase in P2, and no edema   ABD:  soft and nontender with nl inspiratory excursion in the supine position. No bruits or organomegaly appreciated, bowel sounds nl  MS:  Nl gait/ ext warm without deformities, calf tenderness, cyanosis or clubbing No obvious joint restrictions   SKIN: warm and dry without lesions    NEURO:  alert, approp, nl sensorium with  no motor or cerebellar deficits apparent.                   Assessment & Plan:

## 2017-01-25 ENCOUNTER — Encounter: Payer: Self-pay | Admitting: Internal Medicine

## 2017-01-25 NOTE — Assessment & Plan Note (Signed)
Trial of max rx for gerd and pnds (1st gen1) and tessalon 08/29/2016 >>> much better 09/30/2016 but still throat clearing so try gabapentin 100 mg tid  - FENO 09/30/2016  =   8 on pred 5 mg daily  - Allergy profile 09/30/2016 >  Eos 0.2 /  IgE  44/ neg RAST - sinus CT 10/10/2016 > neg  - 11/12/2016 increased gabapentin to 200 mg bid from 100 mg bid  - 01/24/2017 increased gabapentin to 300 tid and d/c chlorpheniramine   She is doing so well at hs that I strongly doubt pnds so d/c h1 as noting cns effects and see if she'll tol full dose gabapentin and if not refer to ENT next   I had an extended discussion with the patient reviewing all relevant studies completed to date and  lasting 15 to 20 minutes of a 25 minute visit    Each maintenance medication was reviewed in detail including most importantly the difference between maintenance and prns and under what circumstances the prns are to be triggered using an action plan format that is not reflected in the computer generated alphabetically organized AVS.    Please see AVS for specific instructions unique to this visit that I personally wrote and verbalized to the the pt in detail and then reviewed with pt  by my nurse highlighting any  changes in therapy recommended at today's visit to their plan of care.

## 2017-01-25 NOTE — Assessment & Plan Note (Signed)
09/30/2016  Walked RA x 3 laps @ 185 ft each stopped due to  End of study, fast pace, no sob or desat    - PFT's  01/24/2017  FVC  2.74(93%) - no obst and no rx  prior to study with DLCO  84/85c % corrects to 102 % for alv volume    Not able to reproduce this symptoms in office and no evidence of RA ILD or airways dz at this point so no further w/u planned for now > consider cpst if worse s obvious cause.

## 2017-02-05 DIAGNOSIS — Z01419 Encounter for gynecological examination (general) (routine) without abnormal findings: Secondary | ICD-10-CM | POA: Diagnosis not present

## 2017-02-05 DIAGNOSIS — Z6832 Body mass index (BMI) 32.0-32.9, adult: Secondary | ICD-10-CM | POA: Diagnosis not present

## 2017-02-05 DIAGNOSIS — Z13820 Encounter for screening for osteoporosis: Secondary | ICD-10-CM | POA: Diagnosis not present

## 2017-02-10 ENCOUNTER — Ambulatory Visit
Admission: RE | Admit: 2017-02-10 | Discharge: 2017-02-10 | Disposition: A | Discharge status: Home / Self Care | Payer: 59 | Source: Ambulatory Visit | Attending: Obstetrics and Gynecology | Admitting: Obstetrics and Gynecology

## 2017-02-10 DIAGNOSIS — Z1231 Encounter for screening mammogram for malignant neoplasm of breast: Secondary | ICD-10-CM | POA: Diagnosis not present

## 2017-02-17 MED FILL — KEVZARA 200 MG/1.14ML SOSY: 200 | 28 days supply | Qty: 2 | Fill #1

## 2017-02-19 MED FILL — LEFLUNOMIDE 20 MG TABLET: 20 | 30 days supply | Qty: 30 | Fill #0

## 2017-02-19 MED FILL — BENZONATATE 200 MG CAPSULE: 200 | 15 days supply | Qty: 45 | Fill #2

## 2017-02-24 MED FILL — FOLIC ACID 1 MG TABLET: 1 | 90 days supply | Qty: 270 | Fill #3

## 2017-03-07 ENCOUNTER — Ambulatory Visit: Payer: 59 | Admitting: Internal Medicine

## 2017-03-12 MED FILL — KEVZARA 200 MG/1.14ML SOSY: 200 | 28 days supply | Qty: 2 | Fill #2

## 2017-03-18 DIAGNOSIS — D485 Neoplasm of uncertain behavior of skin: Secondary | ICD-10-CM | POA: Diagnosis not present

## 2017-03-18 DIAGNOSIS — L570 Actinic keratosis: Secondary | ICD-10-CM | POA: Diagnosis not present

## 2017-03-18 DIAGNOSIS — C44619 Basal cell carcinoma of skin of left upper limb, including shoulder: Secondary | ICD-10-CM | POA: Diagnosis not present

## 2017-03-20 DIAGNOSIS — H524 Presbyopia: Secondary | ICD-10-CM | POA: Diagnosis not present

## 2017-03-20 DIAGNOSIS — H5203 Hypermetropia, bilateral: Secondary | ICD-10-CM | POA: Diagnosis not present

## 2017-03-20 DIAGNOSIS — H52222 Regular astigmatism, left eye: Secondary | ICD-10-CM | POA: Diagnosis not present

## 2017-03-21 MED FILL — OMEPRAZOLE DR 40 MG CAPSULE: 40 | 90 days supply | Qty: 90 | Fill #0

## 2017-03-27 MED FILL — LEFLUNOMIDE 20 MG TABLET: 20 | 30 days supply | Qty: 30 | Fill #0

## 2017-04-01 ENCOUNTER — Other Ambulatory Visit: Payer: Self-pay | Admitting: Pharmacist

## 2017-04-01 DIAGNOSIS — S92901D Unspecified fracture of right foot, subsequent encounter for fracture with routine healing: Secondary | ICD-10-CM | POA: Diagnosis not present

## 2017-04-01 DIAGNOSIS — Z79899 Other long term (current) drug therapy: Secondary | ICD-10-CM | POA: Diagnosis not present

## 2017-04-01 DIAGNOSIS — M0589 Other rheumatoid arthritis with rheumatoid factor of multiple sites: Secondary | ICD-10-CM | POA: Diagnosis not present

## 2017-04-01 DIAGNOSIS — M25561 Pain in right knee: Secondary | ICD-10-CM | POA: Diagnosis not present

## 2017-04-01 DIAGNOSIS — R0609 Other forms of dyspnea: Secondary | ICD-10-CM | POA: Diagnosis not present

## 2017-04-01 DIAGNOSIS — M545 Low back pain: Secondary | ICD-10-CM | POA: Diagnosis not present

## 2017-04-01 DIAGNOSIS — M150 Primary generalized (osteo)arthritis: Secondary | ICD-10-CM | POA: Diagnosis not present

## 2017-04-01 DIAGNOSIS — R05 Cough: Secondary | ICD-10-CM | POA: Diagnosis not present

## 2017-04-01 DIAGNOSIS — M2550 Pain in unspecified joint: Secondary | ICD-10-CM | POA: Diagnosis not present

## 2017-04-01 MED ORDER — SARILUMAB 200 MG/1.14ML ~~LOC~~ SOSY: 1.0000 | PREFILLED_SYRINGE | SUBCUTANEOUS | 5 refills | Status: DC

## 2017-04-02 MED FILL — KEVZARA 200 MG/1.14ML SOSY: 200 | 28 days supply | Qty: 2 | Fill #3

## 2017-04-07 DIAGNOSIS — M069 Rheumatoid arthritis, unspecified: Secondary | ICD-10-CM | POA: Diagnosis not present

## 2017-04-07 DIAGNOSIS — R05 Cough: Secondary | ICD-10-CM | POA: Diagnosis not present

## 2017-04-07 DIAGNOSIS — K219 Gastro-esophageal reflux disease without esophagitis: Secondary | ICD-10-CM | POA: Diagnosis not present

## 2017-04-08 MED FILL — CYCLOBENZAPRINE 10 MG TAB: 10 | 30 days supply | Qty: 30 | Fill #0

## 2017-04-11 MED FILL — predniSONE 5 MG TABS: 5 | 90 days supply | Qty: 90 | Fill #0

## 2017-04-15 DIAGNOSIS — Z85828 Personal history of other malignant neoplasm of skin: Secondary | ICD-10-CM | POA: Diagnosis not present

## 2017-04-15 DIAGNOSIS — C44619 Basal cell carcinoma of skin of left upper limb, including shoulder: Secondary | ICD-10-CM | POA: Diagnosis not present

## 2017-04-15 MED FILL — MUPIROCIN 2% OINTMENT: 2 | 7 days supply | Qty: 22 | Fill #0

## 2017-04-28 DIAGNOSIS — R1013 Epigastric pain: Secondary | ICD-10-CM | POA: Diagnosis not present

## 2017-04-28 DIAGNOSIS — K228 Other specified diseases of esophagus: Secondary | ICD-10-CM | POA: Diagnosis not present

## 2017-04-28 DIAGNOSIS — R12 Heartburn: Secondary | ICD-10-CM | POA: Diagnosis not present

## 2017-04-28 DIAGNOSIS — K449 Diaphragmatic hernia without obstruction or gangrene: Secondary | ICD-10-CM | POA: Diagnosis not present

## 2017-04-29 MED FILL — LEFLUNOMIDE 20 MG TABLET: 20 | 30 days supply | Qty: 30 | Fill #0

## 2017-05-19 MED FILL — KEVZARA 200 MG/1.14ML SOSY: 200 | 28 days supply | Qty: 2 | Fill #0

## 2017-05-29 MED FILL — LEFLUNOMIDE 20 MG TABLET: 20 | 30 days supply | Qty: 30 | Fill #0

## 2017-06-04 DIAGNOSIS — Z13220 Encounter for screening for lipoid disorders: Secondary | ICD-10-CM | POA: Diagnosis not present

## 2017-06-04 DIAGNOSIS — Z0000 Encounter for general adult medical examination without abnormal findings: Secondary | ICD-10-CM | POA: Diagnosis not present

## 2017-06-04 DIAGNOSIS — Z5181 Encounter for therapeutic drug level monitoring: Secondary | ICD-10-CM | POA: Diagnosis not present

## 2017-06-04 MED FILL — MELOXICAM 15 MG TABLET: 15 | 30 days supply | Qty: 30 | Fill #0

## 2017-06-04 MED FILL — traMADol HCL 50 MG TABS: 50 | 30 days supply | Qty: 120 | Fill #0

## 2017-06-12 MED FILL — KEVZARA 200 MG/1.14ML SOSY: 200 | 28 days supply | Qty: 2 | Fill #1

## 2017-06-12 MED FILL — FOLIC ACID 1 MG TABS: 1 | 90 days supply | Qty: 270 | Fill #0

## 2017-07-02 DIAGNOSIS — G6289 Other specified polyneuropathies: Secondary | ICD-10-CM | POA: Diagnosis not present

## 2017-07-02 DIAGNOSIS — R05 Cough: Secondary | ICD-10-CM | POA: Diagnosis not present

## 2017-07-02 DIAGNOSIS — Z79899 Other long term (current) drug therapy: Secondary | ICD-10-CM | POA: Diagnosis not present

## 2017-07-02 DIAGNOSIS — M2550 Pain in unspecified joint: Secondary | ICD-10-CM | POA: Diagnosis not present

## 2017-07-02 DIAGNOSIS — M545 Low back pain: Secondary | ICD-10-CM | POA: Diagnosis not present

## 2017-07-02 DIAGNOSIS — M25561 Pain in right knee: Secondary | ICD-10-CM | POA: Diagnosis not present

## 2017-07-02 DIAGNOSIS — M0589 Other rheumatoid arthritis with rheumatoid factor of multiple sites: Secondary | ICD-10-CM | POA: Diagnosis not present

## 2017-07-02 DIAGNOSIS — S92901D Unspecified fracture of right foot, subsequent encounter for fracture with routine healing: Secondary | ICD-10-CM | POA: Diagnosis not present

## 2017-07-02 DIAGNOSIS — M150 Primary generalized (osteo)arthritis: Secondary | ICD-10-CM | POA: Diagnosis not present

## 2017-07-03 MED FILL — LEFLUNOMIDE 20 MG TABLET: 20 | 30 days supply | Qty: 30 | Fill #0

## 2017-07-04 MED FILL — HYDROCODON-APAP 10-325: 10-325 | 5 days supply | Qty: 60 | Fill #0

## 2017-07-08 MED FILL — KEVZARA 200 MG/1.14ML SOSY: 200 | 28 days supply | Qty: 2 | Fill #2

## 2017-07-24 MED FILL — predniSONE 5 MG TABS: 5 | 90 days supply | Qty: 90 | Fill #0

## 2017-07-24 MED FILL — OMEPRAZOLE DR 40 MG CAPSULE: 40 | 90 days supply | Qty: 90 | Fill #1

## 2017-07-29 MED FILL — LEFLUNOMIDE 20 MG TABLET: 20 | 30 days supply | Qty: 30 | Fill #0

## 2017-08-04 MED FILL — KEVZARA 200 MG/1.14ML SOSY: 200 | 28 days supply | Qty: 2 | Fill #3

## 2017-09-02 MED FILL — KEVZARA 200 MG/1.14ML SOSY: 200 | 28 days supply | Qty: 2 | Fill #4

## 2017-09-02 MED FILL — LEFLUNOMIDE 20 MG TABLET: 20 | 30 days supply | Qty: 30 | Fill #1

## 2017-09-26 MED FILL — LEFLUNOMIDE 20 MG TABLET: 20 | 30 days supply | Qty: 30 | Fill #2

## 2017-10-02 DIAGNOSIS — M545 Low back pain: Secondary | ICD-10-CM | POA: Diagnosis not present

## 2017-10-02 DIAGNOSIS — E669 Obesity, unspecified: Secondary | ICD-10-CM | POA: Diagnosis not present

## 2017-10-02 DIAGNOSIS — G6289 Other specified polyneuropathies: Secondary | ICD-10-CM | POA: Diagnosis not present

## 2017-10-02 DIAGNOSIS — Z79899 Other long term (current) drug therapy: Secondary | ICD-10-CM | POA: Diagnosis not present

## 2017-10-02 DIAGNOSIS — M2550 Pain in unspecified joint: Secondary | ICD-10-CM | POA: Diagnosis not present

## 2017-10-02 DIAGNOSIS — M25561 Pain in right knee: Secondary | ICD-10-CM | POA: Diagnosis not present

## 2017-10-02 DIAGNOSIS — M0589 Other rheumatoid arthritis with rheumatoid factor of multiple sites: Secondary | ICD-10-CM | POA: Diagnosis not present

## 2017-10-02 DIAGNOSIS — M150 Primary generalized (osteo)arthritis: Secondary | ICD-10-CM | POA: Diagnosis not present

## 2017-10-02 DIAGNOSIS — R05 Cough: Secondary | ICD-10-CM | POA: Diagnosis not present

## 2017-10-07 MED FILL — KEVZARA 200 MG/1.14ML SOSY: 200 | 28 days supply | Qty: 2 | Fill #5

## 2017-10-28 MED FILL — OMEPRAZOLE 40 MG CPDR: 40 | 90 days supply | Qty: 90 | Fill #0

## 2017-10-28 MED FILL — FOLIC ACID 1 MG TABS: 1 | 90 days supply | Qty: 270 | Fill #1

## 2017-10-28 MED FILL — LEFLUNOMIDE 20 MG TABLET: 20 | 30 days supply | Qty: 30 | Fill #0

## 2017-10-30 MED FILL — predniSONE 5 MG TABS: 5 | 90 days supply | Qty: 90 | Fill #0

## 2017-11-04 ENCOUNTER — Other Ambulatory Visit: Payer: Self-pay | Admitting: Internal Medicine

## 2017-11-05 ENCOUNTER — Other Ambulatory Visit: Payer: Self-pay | Admitting: Pharmacist

## 2017-11-05 MED ORDER — SARILUMAB 200 MG/1.14ML ~~LOC~~ SOSY: 1.0000 | PREFILLED_SYRINGE | SUBCUTANEOUS | 2 refills | Status: DC

## 2017-11-05 MED FILL — KEVZARA 200 MG/1.14ML SOSY: 200 | 28 days supply | Qty: 2 | Fill #0

## 2017-11-25 MED FILL — LEFLUNOMIDE 20 MG TABLET: 20 | 30 days supply | Qty: 30 | Fill #1

## 2017-12-01 MED FILL — KEVZARA 200 MG/1.14ML SOSY: 200 | 28 days supply | Qty: 2 | Fill #1

## 2017-12-12 ENCOUNTER — Other Ambulatory Visit (HOSPITAL_COMMUNITY): Payer: Self-pay | Admitting: Surgery

## 2017-12-12 DIAGNOSIS — E042 Nontoxic multinodular goiter: Secondary | ICD-10-CM

## 2017-12-16 ENCOUNTER — Ambulatory Visit (HOSPITAL_COMMUNITY)
Admission: RE | Admit: 2017-12-16 | Discharge: 2017-12-16 | Disposition: A | Discharge status: Home / Self Care | Payer: 59 | Source: Ambulatory Visit | Attending: Surgery | Admitting: Surgery

## 2017-12-16 DIAGNOSIS — E042 Nontoxic multinodular goiter: Secondary | ICD-10-CM | POA: Insufficient documentation

## 2017-12-16 DIAGNOSIS — Z23 Encounter for immunization: Secondary | ICD-10-CM | POA: Diagnosis not present

## 2017-12-24 DIAGNOSIS — I10 Essential (primary) hypertension: Secondary | ICD-10-CM | POA: Diagnosis not present

## 2017-12-24 DIAGNOSIS — R079 Chest pain, unspecified: Secondary | ICD-10-CM | POA: Diagnosis not present

## 2017-12-24 DIAGNOSIS — R05 Cough: Secondary | ICD-10-CM | POA: Diagnosis not present

## 2017-12-24 MED FILL — predniSONE 10 MG TABS: 10 | 6 days supply | Qty: 21 | Fill #0

## 2017-12-25 MED FILL — LEFLUNOMIDE 20 MG TABLET: 20 | 30 days supply | Qty: 30 | Fill #0

## 2017-12-26 DIAGNOSIS — E042 Nontoxic multinodular goiter: Secondary | ICD-10-CM | POA: Diagnosis not present

## 2017-12-26 DIAGNOSIS — R399 Unspecified symptoms and signs involving the genitourinary system: Secondary | ICD-10-CM | POA: Diagnosis not present

## 2017-12-26 MED FILL — PHENAZOPYRIDINE 200 MG TAB: 200 | 2 days supply | Qty: 6 | Fill #0

## 2017-12-26 MED FILL — NITROFURANTOIN MCR 100 MG C: 100 | 7 days supply | Qty: 14 | Fill #0

## 2017-12-29 MED FILL — KEVZARA 200 MG/1.14ML SOSY: 200 | 28 days supply | Qty: 2 | Fill #2

## 2018-01-01 ENCOUNTER — Ambulatory Visit (INDEPENDENT_AMBULATORY_CARE_PROVIDER_SITE_OTHER): Payer: 59 | Admitting: Pharmacist

## 2018-01-01 ENCOUNTER — Encounter: Payer: Self-pay | Admitting: Pharmacist

## 2018-01-01 ENCOUNTER — Telehealth: Payer: Self-pay | Admitting: Pharmacist

## 2018-01-01 DIAGNOSIS — M0589 Other rheumatoid arthritis with rheumatoid factor of multiple sites: Secondary | ICD-10-CM | POA: Diagnosis not present

## 2018-01-01 DIAGNOSIS — S92901D Unspecified fracture of right foot, subsequent encounter for fracture with routine healing: Secondary | ICD-10-CM | POA: Diagnosis not present

## 2018-01-01 DIAGNOSIS — M25561 Pain in right knee: Secondary | ICD-10-CM | POA: Diagnosis not present

## 2018-01-01 DIAGNOSIS — Z79899 Other long term (current) drug therapy: Secondary | ICD-10-CM | POA: Diagnosis not present

## 2018-01-01 DIAGNOSIS — R05 Cough: Secondary | ICD-10-CM | POA: Diagnosis not present

## 2018-01-01 DIAGNOSIS — R310 Gross hematuria: Secondary | ICD-10-CM | POA: Diagnosis not present

## 2018-01-01 DIAGNOSIS — G6289 Other specified polyneuropathies: Secondary | ICD-10-CM | POA: Diagnosis not present

## 2018-01-01 DIAGNOSIS — M545 Low back pain: Secondary | ICD-10-CM | POA: Diagnosis not present

## 2018-01-01 DIAGNOSIS — M2550 Pain in unspecified joint: Secondary | ICD-10-CM | POA: Diagnosis not present

## 2018-01-01 DIAGNOSIS — M150 Primary generalized (osteo)arthritis: Secondary | ICD-10-CM | POA: Diagnosis not present

## 2018-01-01 MED ORDER — SARILUMAB 200 MG/1.14ML ~~LOC~~ SOSY: 1.0000 | PREFILLED_SYRINGE | SUBCUTANEOUS | 5 refills | Status: DC

## 2018-01-01 MED FILL — FLOVENT HFA 220 MCG INHALER: 220 | 60 days supply | Qty: 12 | Fill #0

## 2018-01-01 NOTE — Progress Notes (Signed)
   S: Patient presents to [facility] for review of their specialty medication therapy.  Patient is currently taking Kevzara for rheumatoid arthritis. Patient is managed by [name] for this.   Adherence: denies any missed doses  FDA-approved dosing:Rheumatoid arthritis: SubQ: 200 mg once every 2 weeks.  Efficacy: feels like it is working very well for her.  She is happy with her RA control.    Hematologic toxicity: ANC >1,000/mm3: No dosage adjustment necessary. ANC 500 to 1,000/mm3: Interrupt therapy; when [place] >1,000/mm3, resume at 150 mg once every 2 weeks (may increase to 200 mg once every 2 weeks as clinically appropriate). ANC <500/mm3: Discontinue. Platelets 50,000 to 100,000/mm3: Interrupt therapy; when platelet count is >100,000/mm3, resume at 150 mg once every 2 weeks (may increase to 200 mg once every 2 weeks as clinically appropriate). Platelets <50,000/mm3: If confirmed by repeat testing, discontinue. Nonhematologic toxicity: Hypersensitivity (anaphylaxis or other clinically significant hypersensitivity reaction): Stop immediately and discontinue permanently. Infection (serious): Interrupt treatment until the infection is controlled.   Drug-drug interactions: none  Screenings: TB screening: completed per patient Hepatitis Screening: completed per patient  Monitoring: S/sx of infection: denies S/sx of hypersensitivity: denies Injection site reactions: reports that she has redness after injection but that it will clear up on its own. No other s/sx Other adverse effects:denies  O:     Lab Results  Component Value Date   WBC 3.8 (L) 09/30/2016   HGB 14.0 09/30/2016   HCT 42.1 09/30/2016   MCV 102.3 (H) 09/30/2016   PLT 149.0 (L) 09/30/2016      Chemistry      Component Value Date/Time   NA 140 01/24/2013 0914   K 3.4 (L) 01/24/2013 0914   CL 103 01/24/2013 0914   BUN 13 01/24/2013 0914   CREATININE 0.90 01/24/2013 0914   No results found for:  CALCIUM, ALKPHOS, AST, ALT, BILITOT     A/P: 1. Medication review: Patient currently on Kevzara for the treatment of rheumatoid arthritis and is tolerating it well and reports good control of RA.  Reviewed the medication with the patient, including the following: Sarilumab is an interleukin-6 (IL-6) receptor antagonist which binds to both soluble and membrane-bound IL-6 receptors. Endogenous IL-6 is induced by inflammatory stimuli and mediates a variety of immunological responses. IL-6 produced by synovial and endothelial cells leads to local production of IL-6 in joints affected by inflammatory processes such as rheumatoid arthritis. Inhibition of IL-6 receptors by sarilumab leads to a reduction in CRP levels. The most common adverse effects include increased risk of infection, injection site reactions, and neutropenia. There is a possible adverse effect of increased risk of malignancy but it is not fully understood if this is due to the drug or the disease state itself. The patient was instructed to avoid use of live vaccinations without the approval of a physician. No recommendations for any changes at this time.   Christella Hartigan, PharmD, BCPS, BCACP, CPP Clinical Pharmacist Practitioner  [phone]

## 2018-01-01 NOTE — Telephone Encounter (Signed)
Due for follow up, called and left a voicemail requesting that she return my call.

## 2018-01-20 DIAGNOSIS — K219 Gastro-esophageal reflux disease without esophagitis: Secondary | ICD-10-CM | POA: Diagnosis not present

## 2018-01-20 MED FILL — OMEPRAZOLE 40 MG CPDR: 40 | 30 days supply | Qty: 60 | Fill #0

## 2018-01-22 DIAGNOSIS — R3915 Urgency of urination: Secondary | ICD-10-CM | POA: Diagnosis not present

## 2018-01-22 DIAGNOSIS — J209 Acute bronchitis, unspecified: Secondary | ICD-10-CM | POA: Diagnosis not present

## 2018-01-22 DIAGNOSIS — J019 Acute sinusitis, unspecified: Secondary | ICD-10-CM | POA: Diagnosis not present

## 2018-01-22 MED FILL — predniSONE 10 MG TABS: 10 | 6 days supply | Qty: 18 | Fill #0

## 2018-01-22 MED FILL — AMOX-CLAV 875-125 MG TABLET: 875-125 | 10 days supply | Qty: 20 | Fill #0

## 2018-01-22 MED FILL — PROAIR HFA 90 MCG INHALER: 108 (90 BAS | 30 days supply | Qty: 9 | Fill #0

## 2018-01-26 MED FILL — LEFLUNOMIDE 20 MG TABLET: 20 | 30 days supply | Qty: 30 | Fill #1

## 2018-01-26 MED FILL — KEVZARA 200 MG/1.14ML SOSY: 200 | 28 days supply | Qty: 2 | Fill #0

## 2018-02-02 MED FILL — FOLIC ACID 1 MG TABS: 1 | 90 days supply | Qty: 270 | Fill #2

## 2018-02-02 MED FILL — predniSONE 5 MG TABS: 5 | 90 days supply | Qty: 90 | Fill #0

## 2018-02-11 DIAGNOSIS — Z6830 Body mass index (BMI) 30.0-30.9, adult: Secondary | ICD-10-CM | POA: Diagnosis not present

## 2018-02-11 DIAGNOSIS — Z01419 Encounter for gynecological examination (general) (routine) without abnormal findings: Secondary | ICD-10-CM | POA: Diagnosis not present

## 2018-02-11 DIAGNOSIS — R197 Diarrhea, unspecified: Secondary | ICD-10-CM | POA: Diagnosis not present

## 2018-02-11 DIAGNOSIS — Z1231 Encounter for screening mammogram for malignant neoplasm of breast: Secondary | ICD-10-CM | POA: Diagnosis not present

## 2018-02-11 MED FILL — HYDROCODON-APAP 10-325: 10-325 | 5 days supply | Qty: 30 | Fill #0

## 2018-02-11 MED FILL — PANTOPRAZOLE SOD DR 40 MG T: 40 | 30 days supply | Qty: 60 | Fill #0

## 2018-02-11 MED FILL — DIPHENOXYLATE-ATROP 2.5-0.0: 2.5-0.025 | 7 days supply | Qty: 60 | Fill #0

## 2018-02-13 MED FILL — metroNIDAZOLE 500 MG TABS: 500 | 14 days supply | Qty: 42 | Fill #0

## 2018-02-19 DIAGNOSIS — Z1211 Encounter for screening for malignant neoplasm of colon: Secondary | ICD-10-CM | POA: Diagnosis not present

## 2018-02-19 DIAGNOSIS — Z8619 Personal history of other infectious and parasitic diseases: Secondary | ICD-10-CM | POA: Diagnosis not present

## 2018-02-19 DIAGNOSIS — K219 Gastro-esophageal reflux disease without esophagitis: Secondary | ICD-10-CM | POA: Diagnosis not present

## 2018-02-19 DIAGNOSIS — M069 Rheumatoid arthritis, unspecified: Secondary | ICD-10-CM | POA: Diagnosis not present

## 2018-02-27 DIAGNOSIS — Z5181 Encounter for therapeutic drug level monitoring: Secondary | ICD-10-CM | POA: Diagnosis not present

## 2018-02-27 DIAGNOSIS — B370 Candidal stomatitis: Secondary | ICD-10-CM | POA: Diagnosis not present

## 2018-02-27 MED FILL — KEVZARA 200 MG/1.14ML SOSY: 200 | 28 days supply | Qty: 2 | Fill #1

## 2018-02-27 MED FILL — LEFLUNOMIDE 20 MG TABLET: 20 | 30 days supply | Qty: 30 | Fill #0

## 2018-02-27 MED FILL — FLUCONAZOLE 150 MG TABS: 150 | 6 days supply | Qty: 3 | Fill #0

## 2018-03-13 DIAGNOSIS — J029 Acute pharyngitis, unspecified: Secondary | ICD-10-CM | POA: Diagnosis not present

## 2018-03-13 MED FILL — AZELASTINE HCL 137 MCG SPRY: 0.1 | 50 days supply | Qty: 30 | Fill #0

## 2018-03-13 MED FILL — MONTELUKAST SOD 10 MG TAB: 10 | 30 days supply | Qty: 30 | Fill #0 | Status: TO

## 2018-03-13 MED FILL — AMOXICILLIN 500 MG CAPSULE: 500 | 5 days supply | Qty: 10 | Fill #0

## 2018-03-19 DIAGNOSIS — Z8619 Personal history of other infectious and parasitic diseases: Secondary | ICD-10-CM | POA: Diagnosis not present

## 2018-03-19 DIAGNOSIS — Z1211 Encounter for screening for malignant neoplasm of colon: Secondary | ICD-10-CM | POA: Diagnosis not present

## 2018-03-19 DIAGNOSIS — K219 Gastro-esophageal reflux disease without esophagitis: Secondary | ICD-10-CM | POA: Diagnosis not present

## 2018-03-23 DIAGNOSIS — H5203 Hypermetropia, bilateral: Secondary | ICD-10-CM | POA: Diagnosis not present

## 2018-03-23 DIAGNOSIS — H52223 Regular astigmatism, bilateral: Secondary | ICD-10-CM | POA: Diagnosis not present

## 2018-03-24 MED FILL — LEFLUNOMIDE 20 MG TABLET: 20 | 30 days supply | Qty: 30 | Fill #1 | Status: TO

## 2018-03-31 MED FILL — KEVZARA 200 MG/1.14ML SOSY: 200 | 28 days supply | Qty: 2 | Fill #2

## 2018-04-01 DIAGNOSIS — M0589 Other rheumatoid arthritis with rheumatoid factor of multiple sites: Secondary | ICD-10-CM | POA: Diagnosis not present

## 2018-04-01 DIAGNOSIS — R0609 Other forms of dyspnea: Secondary | ICD-10-CM | POA: Diagnosis not present

## 2018-04-01 DIAGNOSIS — M25561 Pain in right knee: Secondary | ICD-10-CM | POA: Diagnosis not present

## 2018-04-01 DIAGNOSIS — M545 Low back pain: Secondary | ICD-10-CM | POA: Diagnosis not present

## 2018-04-01 DIAGNOSIS — R05 Cough: Secondary | ICD-10-CM | POA: Diagnosis not present

## 2018-04-01 DIAGNOSIS — Z79899 Other long term (current) drug therapy: Secondary | ICD-10-CM | POA: Diagnosis not present

## 2018-04-01 DIAGNOSIS — M2550 Pain in unspecified joint: Secondary | ICD-10-CM | POA: Diagnosis not present

## 2018-04-01 DIAGNOSIS — M150 Primary generalized (osteo)arthritis: Secondary | ICD-10-CM | POA: Diagnosis not present

## 2018-04-01 DIAGNOSIS — G6289 Other specified polyneuropathies: Secondary | ICD-10-CM | POA: Diagnosis not present

## 2018-04-08 MED FILL — predniSONE 5 MG TABS: 5 | 90 days supply | Qty: 90 | Fill #0

## 2018-04-08 MED FILL — LEFLUNOMIDE 20 MG TABLET: 20 | 30 days supply | Qty: 30 | Fill #0

## 2018-04-08 MED FILL — CYCLOBENZAPRINE HCL 10 MG T: 10 | 30 days supply | Qty: 30 | Fill #0

## 2018-04-08 MED FILL — MONTELUKAST SOD 10 MG TAB: 10 | 30 days supply | Qty: 30 | Fill #0

## 2018-04-09 ENCOUNTER — Other Ambulatory Visit: Payer: Self-pay | Admitting: Pharmacist

## 2018-04-09 MED ORDER — SARILUMAB 200 MG/1.14ML ~~LOC~~ SOSY: 1.0000 | PREFILLED_SYRINGE | SUBCUTANEOUS | 5 refills | Status: DC

## 2018-05-01 MED FILL — KEVZARA 200 MG/1.14ML SOSY: 200 | 28 days supply | Qty: 2 | Fill #0

## 2018-05-15 MED FILL — LEFLUNOMIDE 20 MG TABS: 20 | 30 days supply | Qty: 30 | Fill #1

## 2018-05-16 MED FILL — PANTOPRAZOLE SOD DR 40 MG T: 40 | 30 days supply | Qty: 60 | Fill #0

## 2018-06-02 MED FILL — KEVZARA 200 MG/1.14ML SOSY: 200 | 28 days supply | Qty: 2 | Fill #1

## 2018-06-19 MED FILL — LEFLUNOMIDE 20 MG TABLET: 20 | 30 days supply | Qty: 30 | Fill #2

## 2018-06-19 MED FILL — PANTOPRAZOLE SOD DR 40 MG T: 40 | 30 days supply | Qty: 60 | Fill #1

## 2018-06-24 MED FILL — PEG-3350 SOLUTION: 420 | 1 days supply | Qty: 4000 | Fill #0

## 2018-07-01 DIAGNOSIS — Z1211 Encounter for screening for malignant neoplasm of colon: Secondary | ICD-10-CM | POA: Diagnosis not present

## 2018-07-01 DIAGNOSIS — K5730 Diverticulosis of large intestine without perforation or abscess without bleeding: Secondary | ICD-10-CM | POA: Diagnosis not present

## 2018-07-01 DIAGNOSIS — D122 Benign neoplasm of ascending colon: Secondary | ICD-10-CM | POA: Diagnosis not present

## 2018-07-01 MED FILL — KEVZARA 200 MG/1.14ML SOSY: 200 | 28 days supply | Qty: 2 | Fill #2

## 2018-07-02 DIAGNOSIS — M0589 Other rheumatoid arthritis with rheumatoid factor of multiple sites: Secondary | ICD-10-CM | POA: Diagnosis not present

## 2018-07-02 DIAGNOSIS — M545 Low back pain: Secondary | ICD-10-CM | POA: Diagnosis not present

## 2018-07-02 DIAGNOSIS — M25561 Pain in right knee: Secondary | ICD-10-CM | POA: Diagnosis not present

## 2018-07-02 DIAGNOSIS — Z79899 Other long term (current) drug therapy: Secondary | ICD-10-CM | POA: Diagnosis not present

## 2018-07-02 DIAGNOSIS — M150 Primary generalized (osteo)arthritis: Secondary | ICD-10-CM | POA: Diagnosis not present

## 2018-07-02 DIAGNOSIS — M2550 Pain in unspecified joint: Secondary | ICD-10-CM | POA: Diagnosis not present

## 2018-07-02 DIAGNOSIS — R0609 Other forms of dyspnea: Secondary | ICD-10-CM | POA: Diagnosis not present

## 2018-07-02 DIAGNOSIS — G6289 Other specified polyneuropathies: Secondary | ICD-10-CM | POA: Diagnosis not present

## 2018-07-02 DIAGNOSIS — R05 Cough: Secondary | ICD-10-CM | POA: Diagnosis not present

## 2018-07-02 DIAGNOSIS — R310 Gross hematuria: Secondary | ICD-10-CM | POA: Diagnosis not present

## 2018-07-06 MED FILL — PANTOPRAZOLE SOD DR 40 MG T: 40 | 30 days supply | Qty: 60 | Fill #0

## 2018-07-06 MED FILL — predniSONE 5 MG TABS: 5 | 90 days supply | Qty: 90 | Fill #0

## 2018-07-06 MED FILL — LEFLUNOMIDE 20 MG TABLET: 20 | 30 days supply | Qty: 30 | Fill #0

## 2018-07-27 MED FILL — KEVZARA 200 MG/1.14ML SOSY: 200 | 28 days supply | Qty: 2 | Fill #0

## 2018-08-10 DIAGNOSIS — M069 Rheumatoid arthritis, unspecified: Secondary | ICD-10-CM | POA: Diagnosis not present

## 2018-08-10 DIAGNOSIS — Z0000 Encounter for general adult medical examination without abnormal findings: Secondary | ICD-10-CM | POA: Diagnosis not present

## 2018-08-10 DIAGNOSIS — I10 Essential (primary) hypertension: Secondary | ICD-10-CM | POA: Diagnosis not present

## 2018-08-10 DIAGNOSIS — Z6831 Body mass index (BMI) 31.0-31.9, adult: Secondary | ICD-10-CM | POA: Diagnosis not present

## 2018-08-10 DIAGNOSIS — E049 Nontoxic goiter, unspecified: Secondary | ICD-10-CM | POA: Diagnosis not present

## 2018-08-10 MED FILL — predniSONE 20 MG TABS: 20 | 5 days supply | Qty: 10 | Fill #0

## 2018-08-13 MED FILL — LEFLUNOMIDE 20 MG TABLET: 20 | 30 days supply | Qty: 30 | Fill #0

## 2018-08-13 MED FILL — FOLIC ACID 1 MG TABS: 1 | 90 days supply | Qty: 270 | Fill #0

## 2018-08-24 MED FILL — KEVZARA 200 MG/1.14ML SOSY: 200 | 28 days supply | Qty: 2 | Fill #1

## 2018-09-14 MED FILL — LEFLUNOMIDE 20 MG TABLET: 20 | 30 days supply | Qty: 30 | Fill #1

## 2018-09-14 MED FILL — PANTOPRAZOLE SOD DR 40 MG T: 40 | 30 days supply | Qty: 60 | Fill #0

## 2018-09-17 MED FILL — predniSONE 5 MG TABS: 5 | 12 days supply | Qty: 48 | Fill #0

## 2018-09-23 MED FILL — KEVZARA 200 MG/1.14ML SOSY: 200 | 28 days supply | Qty: 2 | Fill #2

## 2018-10-05 DIAGNOSIS — M25561 Pain in right knee: Secondary | ICD-10-CM | POA: Diagnosis not present

## 2018-10-05 DIAGNOSIS — M2550 Pain in unspecified joint: Secondary | ICD-10-CM | POA: Diagnosis not present

## 2018-10-05 DIAGNOSIS — Z6832 Body mass index (BMI) 32.0-32.9, adult: Secondary | ICD-10-CM | POA: Diagnosis not present

## 2018-10-05 DIAGNOSIS — M545 Low back pain: Secondary | ICD-10-CM | POA: Diagnosis not present

## 2018-10-05 DIAGNOSIS — G6289 Other specified polyneuropathies: Secondary | ICD-10-CM | POA: Diagnosis not present

## 2018-10-05 DIAGNOSIS — M0589 Other rheumatoid arthritis with rheumatoid factor of multiple sites: Secondary | ICD-10-CM | POA: Diagnosis not present

## 2018-10-05 DIAGNOSIS — R05 Cough: Secondary | ICD-10-CM | POA: Diagnosis not present

## 2018-10-05 DIAGNOSIS — M150 Primary generalized (osteo)arthritis: Secondary | ICD-10-CM | POA: Diagnosis not present

## 2018-10-05 DIAGNOSIS — Z79899 Other long term (current) drug therapy: Secondary | ICD-10-CM | POA: Diagnosis not present

## 2018-10-05 MED FILL — SULFASALAZINE 500 MG TABS: 500 | 30 days supply | Qty: 120 | Fill #0

## 2018-10-06 MED FILL — predniSONE 5 MG TABS: 5 | 90 days supply | Qty: 90 | Fill #0

## 2018-10-08 MED FILL — PANTOPRAZOLE SOD DR 40 MG T: 40 | 30 days supply | Qty: 60 | Fill #0

## 2018-10-13 MED FILL — LEFLUNOMIDE 20 MG TABLET: 20 | 30 days supply | Qty: 30 | Fill #2

## 2018-10-22 DIAGNOSIS — R197 Diarrhea, unspecified: Secondary | ICD-10-CM | POA: Diagnosis not present

## 2018-10-22 MED FILL — VANCOMYCIN HCL 125 MG CAP: 125 | 10 days supply | Qty: 40 | Fill #0

## 2018-10-23 ENCOUNTER — Other Ambulatory Visit: Payer: Self-pay | Admitting: Pharmacist

## 2018-10-23 MED ORDER — KEVZARA 200 MG/1.14ML ~~LOC~~ SOSY: 1.0000 | PREFILLED_SYRINGE | SUBCUTANEOUS | 3 refills | Status: DC

## 2018-10-27 DIAGNOSIS — K219 Gastro-esophageal reflux disease without esophagitis: Secondary | ICD-10-CM | POA: Diagnosis not present

## 2018-10-27 DIAGNOSIS — R197 Diarrhea, unspecified: Secondary | ICD-10-CM | POA: Diagnosis not present

## 2018-10-27 DIAGNOSIS — M069 Rheumatoid arthritis, unspecified: Secondary | ICD-10-CM | POA: Diagnosis not present

## 2018-10-29 MED FILL — KEVZARA 200 MG/1.14ML SOSY: 200 | 28 days supply | Qty: 2 | Fill #0

## 2018-11-10 MED FILL — LEFLUNOMIDE 20 MG TABLET: 20 | 30 days supply | Qty: 30 | Fill #3

## 2018-11-10 MED FILL — PANTOPRAZOLE SOD DR 40 MG T: 40 | 30 days supply | Qty: 60 | Fill #1

## 2018-11-10 MED FILL — FOLIC ACID 1 MG TABS: 1 | 90 days supply | Qty: 270 | Fill #1

## 2018-11-24 MED FILL — KEVZARA 200 MG/1.14ML SOSY: 200 | 28 days supply | Qty: 2 | Fill #1

## 2018-11-27 DIAGNOSIS — I10 Essential (primary) hypertension: Secondary | ICD-10-CM | POA: Diagnosis not present

## 2018-11-27 DIAGNOSIS — R8290 Unspecified abnormal findings in urine: Secondary | ICD-10-CM | POA: Diagnosis not present

## 2018-11-27 DIAGNOSIS — R0602 Shortness of breath: Secondary | ICD-10-CM | POA: Diagnosis not present

## 2018-11-27 DIAGNOSIS — R5382 Chronic fatigue, unspecified: Secondary | ICD-10-CM | POA: Diagnosis not present

## 2018-11-27 MED FILL — FUROSEMIDE 40 MG TAB: 40 | 3 days supply | Qty: 3 | Fill #0

## 2018-11-30 MED FILL — POTASSIUM CHLORIDE CRYS ER: 20 | 5 days supply | Qty: 5 | Fill #0

## 2018-12-07 DIAGNOSIS — I10 Essential (primary) hypertension: Secondary | ICD-10-CM | POA: Diagnosis not present

## 2018-12-07 DIAGNOSIS — B029 Zoster without complications: Secondary | ICD-10-CM | POA: Diagnosis not present

## 2018-12-07 MED FILL — FUROSEMIDE 40 MG TAB: 40 | 30 days supply | Qty: 30 | Fill #0

## 2018-12-07 MED FILL — HYDROCODON-APAP 5-325: 5-325 | 5 days supply | Qty: 20 | Fill #0

## 2018-12-07 MED FILL — valACYclovir HCL 1 GM TABS: 1 | 7 days supply | Qty: 21 | Fill #0

## 2018-12-07 MED FILL — predniSONE 20 MG TABS: 20 | 7 days supply | Qty: 14 | Fill #0

## 2018-12-14 MED FILL — LEFLUNOMIDE 20 MG TABLET: 20 | 30 days supply | Qty: 30 | Fill #4

## 2018-12-24 DIAGNOSIS — R05 Cough: Secondary | ICD-10-CM | POA: Diagnosis not present

## 2018-12-24 DIAGNOSIS — G6289 Other specified polyneuropathies: Secondary | ICD-10-CM | POA: Diagnosis not present

## 2018-12-24 DIAGNOSIS — M150 Primary generalized (osteo)arthritis: Secondary | ICD-10-CM | POA: Diagnosis not present

## 2018-12-24 DIAGNOSIS — Z79899 Other long term (current) drug therapy: Secondary | ICD-10-CM | POA: Diagnosis not present

## 2018-12-24 DIAGNOSIS — M2550 Pain in unspecified joint: Secondary | ICD-10-CM | POA: Diagnosis not present

## 2018-12-24 DIAGNOSIS — M25561 Pain in right knee: Secondary | ICD-10-CM | POA: Diagnosis not present

## 2018-12-24 DIAGNOSIS — M545 Low back pain: Secondary | ICD-10-CM | POA: Diagnosis not present

## 2018-12-24 DIAGNOSIS — M5431 Sciatica, right side: Secondary | ICD-10-CM | POA: Diagnosis not present

## 2018-12-24 DIAGNOSIS — M0589 Other rheumatoid arthritis with rheumatoid factor of multiple sites: Secondary | ICD-10-CM | POA: Diagnosis not present

## 2018-12-24 MED FILL — predniSONE 5 MG TABS: 5 | 90 days supply | Qty: 90 | Fill #0

## 2018-12-28 MED FILL — PANTOPRAZOLE SOD DR 40 MG T: 40 | 30 days supply | Qty: 60 | Fill #2

## 2018-12-28 MED FILL — KEVZARA 200 MG/1.14ML SOSY: 200 | 28 days supply | Qty: 2 | Fill #2

## 2018-12-28 MED FILL — CYCLOBENZAPRINE 10 MG TAB: 10 | 30 days supply | Qty: 30 | Fill #0

## 2018-12-29 ENCOUNTER — Other Ambulatory Visit: Payer: Self-pay

## 2018-12-29 ENCOUNTER — Ambulatory Visit (HOSPITAL_BASED_OUTPATIENT_CLINIC_OR_DEPARTMENT_OTHER): Payer: 59 | Admitting: Pharmacist

## 2018-12-29 DIAGNOSIS — Z79899 Other long term (current) drug therapy: Secondary | ICD-10-CM

## 2018-12-29 NOTE — Progress Notes (Signed)
Virtual Visit via Telephone Note Due to current restrictions/limitations of in-office visits due to the COVID-19 pandemic, this scheduled clinical appointment was converted to a telehealth visit  I connected with Olivia Rubio 12/15/20at2:30 pm by telephone.Verified that I was speaking toherusing two patient identifiers.  I am in my office. The patientis at home.Only the patient and myself participated in this encounter.   S: Patient presents for review of their specialty medication therapy.  Patient is currently taking Kevzara for rheumatoid arthritis. Patient is managed by [name] for this.   Adherence: denies any missed doses  FDA-approved dosing:Rheumatoid arthritis: SubQ: 200 mg once every 2 weeks.  Efficacy: feels like it is working very well for her. She reports decrease in efficacy several months ago but believes it may be due to something else as she feels better now.    Hematologic toxicity: ANC >1,000/mm3: No dosage adjustment necessary. ANC 500 to 1,000/mm3: Interrupt therapy; when [place] >1,000/mm3, resume at 150 mg once every 2 weeks (may increase to 200 mg once every 2 weeks as clinically appropriate). ANC <500/mm3: Discontinue. Platelets 50,000 to 100,000/mm3: Interrupt therapy; when platelet count is >100,000/mm3, resume at 150 mg once every 2 weeks (may increase to 200 mg once every 2 weeks as clinically appropriate). Platelets <50,000/mm3: If confirmed by repeat testing, discontinue. Nonhematologic toxicity: Hypersensitivity (anaphylaxis or other clinically significant hypersensitivity reaction): Stop immediately and discontinue permanently. Infection (serious): Interrupt treatment until the infection is controlled.   Drug-drug interactions: none  Screenings: TB screening: completed per patient Hepatitis Screening: completed per patient  Monitoring: S/sx of infection: denies S/sx of hypersensitivity: denies Injection site reactions:  denies Other adverse effects:denies  O: Followed by RA. Reports blood work WNL; results from Monday (12/28/18).   Lab Results  Component Value Date   WBC 3.8 (L) 09/30/2016   HGB 14.0 09/30/2016   HCT 42.1 09/30/2016   MCV 102.3 (H) 09/30/2016   PLT 149.0 (L) 09/30/2016      Chemistry      Component Value Date/Time   NA 140 01/24/2013 0914   K 3.4 (L) 01/24/2013 0914   CL 103 01/24/2013 0914   BUN 13 01/24/2013 0914   CREATININE 0.90 01/24/2013 0914   No results found for: CALCIUM, ALKPHOS, AST, ALT, BILITOT    A/P: 1. Medication review: Patient currently on Kevzara for the treatment of rheumatoid arthritis and is tolerating it well and reports good control of RA.  Reviewed the medication with the patient, including the following: Sarilumab is an interleukin-6 (IL-6) receptor antagonist which binds to both soluble and membrane-bound IL-6 receptors. Endogenous IL-6 is induced by inflammatory stimuli and mediates a variety of immunological responses. IL-6 produced by synovial and endothelial cells leads to local production of IL-6 in joints affected by inflammatory processes such as rheumatoid arthritis. Inhibition of IL-6 receptors by sarilumab leads to a reduction in CRP levels. The most common adverse effects include increased risk of infection, injection site reactions, and neutropenia. There is a possible adverse effect of increased risk of malignancy but it is not fully understood if this is due to the drug or the disease state itself. The patient was instructed to avoid use of live vaccinations without the approval of a physician. No recommendations for any changes at this time.  Benard Halsted, PharmD, [place] [phone]

## 2019-01-18 MED FILL — LEFLUNOMIDE 20 MG TABLET: 20 | 30 days supply | Qty: 30 | Fill #5

## 2019-01-20 ENCOUNTER — Other Ambulatory Visit: Payer: Self-pay

## 2019-01-20 DIAGNOSIS — Z20822 Contact with and (suspected) exposure to covid-19: Secondary | ICD-10-CM | POA: Diagnosis not present

## 2019-01-21 LAB — NOVEL CORONAVIRUS, NAA: SARS-CoV-2, NAA: NOT DETECTED

## 2019-01-21 MED FILL — KEVZARA 200 MG/1.14ML SOSY: 200 | 28 days supply | Qty: 2 | Fill #0

## 2019-02-09 ENCOUNTER — Ambulatory Visit
Admission: EM | Admit: 2019-02-09 | Discharge: 2019-02-09 | Disposition: A | Discharge status: Home / Self Care | Payer: 59

## 2019-02-09 ENCOUNTER — Other Ambulatory Visit: Payer: Self-pay

## 2019-02-09 DIAGNOSIS — M545 Low back pain: Secondary | ICD-10-CM | POA: Diagnosis not present

## 2019-02-09 NOTE — ED Notes (Signed)
Patient left without being seen prior to triage.  Made aware by registration.

## 2019-02-10 DIAGNOSIS — M545 Low back pain: Secondary | ICD-10-CM | POA: Diagnosis not present

## 2019-02-10 MED FILL — HYDROCODON-APAP 5-325: 5-325 | 5 days supply | Qty: 20 | Fill #0

## 2019-02-10 MED FILL — predniSONE 10 MG TABS: 10 | 6 days supply | Qty: 21 | Fill #0

## 2019-02-12 ENCOUNTER — Other Ambulatory Visit: Payer: Self-pay | Admitting: Internal Medicine

## 2019-02-16 DIAGNOSIS — R1084 Generalized abdominal pain: Secondary | ICD-10-CM | POA: Diagnosis not present

## 2019-02-16 DIAGNOSIS — M549 Dorsalgia, unspecified: Secondary | ICD-10-CM | POA: Diagnosis not present

## 2019-02-16 MED FILL — HYDROCODON-APAP 5-325: 5-325 | 5 days supply | Qty: 20 | Fill #0

## 2019-02-17 ENCOUNTER — Other Ambulatory Visit: Payer: Self-pay | Admitting: Pharmacist

## 2019-02-17 MED ORDER — KEVZARA 200 MG/1.14ML ~~LOC~~ SOSY: 1.0000 | PREFILLED_SYRINGE | SUBCUTANEOUS | 3 refills | Status: DC

## 2019-02-22 ENCOUNTER — Other Ambulatory Visit: Payer: Self-pay

## 2019-02-22 ENCOUNTER — Other Ambulatory Visit: Payer: Self-pay | Admitting: Family Medicine

## 2019-02-22 ENCOUNTER — Ambulatory Visit
Admission: RE | Admit: 2019-02-22 | Discharge: 2019-02-22 | Disposition: A | Discharge status: Home / Self Care | Payer: 59 | Source: Ambulatory Visit | Attending: Family Medicine | Admitting: Family Medicine

## 2019-02-22 DIAGNOSIS — R1084 Generalized abdominal pain: Secondary | ICD-10-CM

## 2019-02-22 DIAGNOSIS — R52 Pain, unspecified: Secondary | ICD-10-CM

## 2019-02-22 DIAGNOSIS — M542 Cervicalgia: Secondary | ICD-10-CM | POA: Diagnosis not present

## 2019-02-22 DIAGNOSIS — K7689 Other specified diseases of liver: Secondary | ICD-10-CM | POA: Diagnosis not present

## 2019-02-22 DIAGNOSIS — M545 Low back pain: Secondary | ICD-10-CM | POA: Diagnosis not present

## 2019-02-22 MED FILL — KEVZARA 200 MG/1.14ML SOSY: 200 | 28 days supply | Qty: 2 | Fill #0

## 2019-02-23 ENCOUNTER — Other Ambulatory Visit (HOSPITAL_COMMUNITY): Payer: Self-pay | Admitting: Family Medicine

## 2019-02-23 MED FILL — LEFLUNOMIDE 20 MG TABLET: 20 | 30 days supply | Qty: 30 | Fill #0

## 2019-02-23 MED FILL — CYCLOBENZAPRINE HCL 10 MG T: 10 | 30 days supply | Qty: 30 | Fill #0

## 2019-02-24 ENCOUNTER — Other Ambulatory Visit: Payer: Self-pay | Admitting: Family Medicine

## 2019-02-26 DIAGNOSIS — M4856XA Collapsed vertebra, not elsewhere classified, lumbar region, initial encounter for fracture: Secondary | ICD-10-CM | POA: Diagnosis not present

## 2019-02-26 DIAGNOSIS — M545 Low back pain: Secondary | ICD-10-CM | POA: Diagnosis not present

## 2019-03-01 DIAGNOSIS — Z1231 Encounter for screening mammogram for malignant neoplasm of breast: Secondary | ICD-10-CM | POA: Diagnosis not present

## 2019-03-01 DIAGNOSIS — G8929 Other chronic pain: Secondary | ICD-10-CM | POA: Diagnosis not present

## 2019-03-01 DIAGNOSIS — M810 Age-related osteoporosis without current pathological fracture: Secondary | ICD-10-CM | POA: Diagnosis not present

## 2019-03-01 DIAGNOSIS — Z6834 Body mass index (BMI) 34.0-34.9, adult: Secondary | ICD-10-CM | POA: Diagnosis not present

## 2019-03-01 DIAGNOSIS — Z01419 Encounter for gynecological examination (general) (routine) without abnormal findings: Secondary | ICD-10-CM | POA: Diagnosis not present

## 2019-03-01 DIAGNOSIS — Z13820 Encounter for screening for osteoporosis: Secondary | ICD-10-CM | POA: Diagnosis not present

## 2019-03-01 MED FILL — PANTOPRAZOLE SOD DR 40 MG T: 40 | 90 days supply | Qty: 180 | Fill #0

## 2019-03-01 MED FILL — IBANDRONATE NA 150 MG TAB: 150 | 90 days supply | Qty: 3 | Fill #0

## 2019-03-01 MED FILL — HYDROCODON-APAP 10-325: 10-325 | 5 days supply | Qty: 30 | Fill #0

## 2019-03-03 MED FILL — NYSTATIN-TRIAMCINOLONE OINT: 100000-0.1 | 20 days supply | Qty: 30 | Fill #0

## 2019-03-04 MED FILL — VIT D2 1.25 MG (50,000 UNIT: 1.25 MG | 70 days supply | Qty: 10 | Fill #0

## 2019-03-05 DIAGNOSIS — M5416 Radiculopathy, lumbar region: Secondary | ICD-10-CM | POA: Diagnosis not present

## 2019-03-11 DIAGNOSIS — M4856XA Collapsed vertebra, not elsewhere classified, lumbar region, initial encounter for fracture: Secondary | ICD-10-CM | POA: Diagnosis not present

## 2019-03-15 DIAGNOSIS — R197 Diarrhea, unspecified: Secondary | ICD-10-CM | POA: Diagnosis not present

## 2019-03-16 DIAGNOSIS — R197 Diarrhea, unspecified: Secondary | ICD-10-CM | POA: Diagnosis not present

## 2019-03-19 MED FILL — predniSONE 5 MG TABS: 5 | 90 days supply | Qty: 90 | Fill #1

## 2019-03-22 MED FILL — LEFLUNOMIDE 20 MG TABLET: 20 | 30 days supply | Qty: 30 | Fill #0

## 2019-03-22 MED FILL — KEVZARA 200 MG/1.14ML SOSY: 200 | 28 days supply | Qty: 2 | Fill #1

## 2019-03-23 MED FILL — HYDROCODON-APAP 5-325: 5-325 | 7 days supply | Qty: 21 | Fill #0

## 2019-03-24 DIAGNOSIS — M0589 Other rheumatoid arthritis with rheumatoid factor of multiple sites: Secondary | ICD-10-CM | POA: Diagnosis not present

## 2019-03-24 DIAGNOSIS — R7989 Other specified abnormal findings of blood chemistry: Secondary | ICD-10-CM | POA: Diagnosis not present

## 2019-03-24 DIAGNOSIS — R05 Cough: Secondary | ICD-10-CM | POA: Diagnosis not present

## 2019-03-24 DIAGNOSIS — E669 Obesity, unspecified: Secondary | ICD-10-CM | POA: Diagnosis not present

## 2019-03-24 DIAGNOSIS — M5431 Sciatica, right side: Secondary | ICD-10-CM | POA: Diagnosis not present

## 2019-03-24 DIAGNOSIS — M150 Primary generalized (osteo)arthritis: Secondary | ICD-10-CM | POA: Diagnosis not present

## 2019-03-24 DIAGNOSIS — Z6834 Body mass index (BMI) 34.0-34.9, adult: Secondary | ICD-10-CM | POA: Diagnosis not present

## 2019-03-24 DIAGNOSIS — M8080XA Other osteoporosis with current pathological fracture, unspecified site, initial encounter for fracture: Secondary | ICD-10-CM | POA: Diagnosis not present

## 2019-03-24 DIAGNOSIS — M2550 Pain in unspecified joint: Secondary | ICD-10-CM | POA: Diagnosis not present

## 2019-04-05 DIAGNOSIS — H52223 Regular astigmatism, bilateral: Secondary | ICD-10-CM | POA: Diagnosis not present

## 2019-04-05 DIAGNOSIS — H2513 Age-related nuclear cataract, bilateral: Secondary | ICD-10-CM | POA: Diagnosis not present

## 2019-04-05 DIAGNOSIS — H5203 Hypermetropia, bilateral: Secondary | ICD-10-CM | POA: Diagnosis not present

## 2019-04-08 DIAGNOSIS — Z6834 Body mass index (BMI) 34.0-34.9, adult: Secondary | ICD-10-CM | POA: Diagnosis not present

## 2019-04-08 DIAGNOSIS — M25561 Pain in right knee: Secondary | ICD-10-CM | POA: Diagnosis not present

## 2019-04-08 DIAGNOSIS — M25562 Pain in left knee: Secondary | ICD-10-CM | POA: Diagnosis not present

## 2019-04-08 DIAGNOSIS — M0589 Other rheumatoid arthritis with rheumatoid factor of multiple sites: Secondary | ICD-10-CM | POA: Diagnosis not present

## 2019-04-08 DIAGNOSIS — R05 Cough: Secondary | ICD-10-CM | POA: Diagnosis not present

## 2019-04-08 DIAGNOSIS — M8080XA Other osteoporosis with current pathological fracture, unspecified site, initial encounter for fracture: Secondary | ICD-10-CM | POA: Diagnosis not present

## 2019-04-08 DIAGNOSIS — R7989 Other specified abnormal findings of blood chemistry: Secondary | ICD-10-CM | POA: Diagnosis not present

## 2019-04-08 DIAGNOSIS — M5431 Sciatica, right side: Secondary | ICD-10-CM | POA: Diagnosis not present

## 2019-04-08 DIAGNOSIS — E669 Obesity, unspecified: Secondary | ICD-10-CM | POA: Diagnosis not present

## 2019-04-09 ENCOUNTER — Other Ambulatory Visit (HOSPITAL_COMMUNITY): Payer: Self-pay | Admitting: Specialist

## 2019-04-09 DIAGNOSIS — M810 Age-related osteoporosis without current pathological fracture: Secondary | ICD-10-CM | POA: Diagnosis not present

## 2019-04-09 DIAGNOSIS — M4856XA Collapsed vertebra, not elsewhere classified, lumbar region, initial encounter for fracture: Secondary | ICD-10-CM | POA: Diagnosis not present

## 2019-04-09 MED FILL — IBUPROFEN 800 MG TABS: 800 | 30 days supply | Qty: 30 | Fill #0

## 2019-04-12 DIAGNOSIS — Z13220 Encounter for screening for lipoid disorders: Secondary | ICD-10-CM | POA: Diagnosis not present

## 2019-04-12 DIAGNOSIS — R945 Abnormal results of liver function studies: Secondary | ICD-10-CM | POA: Diagnosis not present

## 2019-04-15 MED FILL — KEVZARA 200 MG/1.14ML SOSY: 200 | 28 days supply | Qty: 2 | Fill #2

## 2019-04-23 DIAGNOSIS — M25562 Pain in left knee: Secondary | ICD-10-CM | POA: Diagnosis not present

## 2019-04-23 DIAGNOSIS — M25561 Pain in right knee: Secondary | ICD-10-CM | POA: Diagnosis not present

## 2019-04-23 DIAGNOSIS — M810 Age-related osteoporosis without current pathological fracture: Secondary | ICD-10-CM | POA: Diagnosis not present

## 2019-04-28 DIAGNOSIS — M25562 Pain in left knee: Secondary | ICD-10-CM | POA: Diagnosis not present

## 2019-05-03 MED FILL — VIT D2 1.25 MG (50,000 UNIT: 1.25 MG | 70 days supply | Qty: 10 | Fill #1

## 2019-05-03 MED FILL — FOLIC ACID 1 MG TABS: 1 | 90 days supply | Qty: 270 | Fill #0

## 2019-05-05 MED FILL — LEFLUNOMIDE 10 MG TABLET: 10 | 30 days supply | Qty: 30 | Fill #0

## 2019-05-13 DIAGNOSIS — M810 Age-related osteoporosis without current pathological fracture: Secondary | ICD-10-CM | POA: Diagnosis not present

## 2019-05-13 DIAGNOSIS — E559 Vitamin D deficiency, unspecified: Secondary | ICD-10-CM | POA: Diagnosis not present

## 2019-05-13 DIAGNOSIS — E049 Nontoxic goiter, unspecified: Secondary | ICD-10-CM | POA: Diagnosis not present

## 2019-05-13 DIAGNOSIS — E213 Hyperparathyroidism, unspecified: Secondary | ICD-10-CM | POA: Diagnosis not present

## 2019-05-14 DIAGNOSIS — M25562 Pain in left knee: Secondary | ICD-10-CM | POA: Diagnosis not present

## 2019-05-20 MED FILL — KEVZARA 200 MG/1.14ML SOSY: 200 | 28 days supply | Qty: 2 | Fill #3

## 2019-06-03 MED FILL — LEFLUNOMIDE 10 MG TABLET: 10 | 30 days supply | Qty: 30 | Fill #1

## 2019-06-04 DIAGNOSIS — D2262 Melanocytic nevi of left upper limb, including shoulder: Secondary | ICD-10-CM | POA: Diagnosis not present

## 2019-06-04 DIAGNOSIS — L821 Other seborrheic keratosis: Secondary | ICD-10-CM | POA: Diagnosis not present

## 2019-06-04 DIAGNOSIS — Z85828 Personal history of other malignant neoplasm of skin: Secondary | ICD-10-CM | POA: Diagnosis not present

## 2019-06-04 DIAGNOSIS — L905 Scar conditions and fibrosis of skin: Secondary | ICD-10-CM | POA: Diagnosis not present

## 2019-06-04 DIAGNOSIS — D485 Neoplasm of uncertain behavior of skin: Secondary | ICD-10-CM | POA: Diagnosis not present

## 2019-06-04 DIAGNOSIS — D2261 Melanocytic nevi of right upper limb, including shoulder: Secondary | ICD-10-CM | POA: Diagnosis not present

## 2019-06-04 DIAGNOSIS — D1801 Hemangioma of skin and subcutaneous tissue: Secondary | ICD-10-CM | POA: Diagnosis not present

## 2019-06-04 DIAGNOSIS — D225 Melanocytic nevi of trunk: Secondary | ICD-10-CM | POA: Diagnosis not present

## 2019-06-11 ENCOUNTER — Other Ambulatory Visit: Payer: Self-pay | Admitting: Pharmacist

## 2019-06-11 MED ORDER — KEVZARA 200 MG/1.14ML ~~LOC~~ SOSY: 1.0000 | PREFILLED_SYRINGE | SUBCUTANEOUS | 2 refills | Status: DC

## 2019-06-15 ENCOUNTER — Other Ambulatory Visit (HOSPITAL_COMMUNITY): Payer: Self-pay | Admitting: Orthopedic Surgery

## 2019-06-15 DIAGNOSIS — M79675 Pain in left toe(s): Secondary | ICD-10-CM | POA: Diagnosis not present

## 2019-06-15 DIAGNOSIS — S92415A Nondisplaced fracture of proximal phalanx of left great toe, initial encounter for closed fracture: Secondary | ICD-10-CM | POA: Diagnosis not present

## 2019-06-15 DIAGNOSIS — M25512 Pain in left shoulder: Secondary | ICD-10-CM | POA: Diagnosis not present

## 2019-06-15 DIAGNOSIS — M25522 Pain in left elbow: Secondary | ICD-10-CM | POA: Diagnosis not present

## 2019-06-15 MED FILL — IBUPROFEN 800 MG TABS: 800 | 30 days supply | Qty: 90 | Fill #0

## 2019-06-16 MED FILL — KEVZARA 200 MG/1.14ML SOSY: 200 | 28 days supply | Qty: 2 | Fill #0

## 2019-06-22 DIAGNOSIS — Z85828 Personal history of other malignant neoplasm of skin: Secondary | ICD-10-CM | POA: Diagnosis not present

## 2019-06-22 DIAGNOSIS — L988 Other specified disorders of the skin and subcutaneous tissue: Secondary | ICD-10-CM | POA: Diagnosis not present

## 2019-06-22 DIAGNOSIS — D485 Neoplasm of uncertain behavior of skin: Secondary | ICD-10-CM | POA: Diagnosis not present

## 2019-06-24 DIAGNOSIS — R7989 Other specified abnormal findings of blood chemistry: Secondary | ICD-10-CM | POA: Diagnosis not present

## 2019-06-24 DIAGNOSIS — M545 Low back pain: Secondary | ICD-10-CM | POA: Diagnosis not present

## 2019-06-24 DIAGNOSIS — M0589 Other rheumatoid arthritis with rheumatoid factor of multiple sites: Secondary | ICD-10-CM | POA: Diagnosis not present

## 2019-06-24 DIAGNOSIS — M2550 Pain in unspecified joint: Secondary | ICD-10-CM | POA: Diagnosis not present

## 2019-06-24 DIAGNOSIS — R05 Cough: Secondary | ICD-10-CM | POA: Diagnosis not present

## 2019-06-24 DIAGNOSIS — M8080XA Other osteoporosis with current pathological fracture, unspecified site, initial encounter for fracture: Secondary | ICD-10-CM | POA: Diagnosis not present

## 2019-06-24 DIAGNOSIS — G6289 Other specified polyneuropathies: Secondary | ICD-10-CM | POA: Diagnosis not present

## 2019-06-24 DIAGNOSIS — Z79899 Other long term (current) drug therapy: Secondary | ICD-10-CM | POA: Diagnosis not present

## 2019-06-24 DIAGNOSIS — M150 Primary generalized (osteo)arthritis: Secondary | ICD-10-CM | POA: Diagnosis not present

## 2019-07-05 DIAGNOSIS — Z4802 Encounter for removal of sutures: Secondary | ICD-10-CM | POA: Diagnosis not present

## 2019-07-07 DIAGNOSIS — M25522 Pain in left elbow: Secondary | ICD-10-CM | POA: Diagnosis not present

## 2019-07-07 DIAGNOSIS — M79675 Pain in left toe(s): Secondary | ICD-10-CM | POA: Diagnosis not present

## 2019-07-07 DIAGNOSIS — M069 Rheumatoid arthritis, unspecified: Secondary | ICD-10-CM | POA: Diagnosis not present

## 2019-07-15 MED FILL — KEVZARA 200 MG/1.14ML SOSY: 200 | 28 days supply | Qty: 2 | Fill #1

## 2019-07-22 MED FILL — VIT D2 1.25 MG (50,000 UNIT: 1.25 MG | 70 days supply | Qty: 10 | Fill #2

## 2019-08-11 DIAGNOSIS — I10 Essential (primary) hypertension: Secondary | ICD-10-CM | POA: Diagnosis not present

## 2019-08-11 DIAGNOSIS — R5382 Chronic fatigue, unspecified: Secondary | ICD-10-CM | POA: Diagnosis not present

## 2019-08-11 DIAGNOSIS — M069 Rheumatoid arthritis, unspecified: Secondary | ICD-10-CM | POA: Diagnosis not present

## 2019-08-11 DIAGNOSIS — Z13220 Encounter for screening for lipoid disorders: Secondary | ICD-10-CM | POA: Diagnosis not present

## 2019-08-11 DIAGNOSIS — R399 Unspecified symptoms and signs involving the genitourinary system: Secondary | ICD-10-CM | POA: Diagnosis not present

## 2019-08-11 DIAGNOSIS — R0602 Shortness of breath: Secondary | ICD-10-CM | POA: Diagnosis not present

## 2019-08-11 DIAGNOSIS — Z0000 Encounter for general adult medical examination without abnormal findings: Secondary | ICD-10-CM | POA: Diagnosis not present

## 2019-08-11 DIAGNOSIS — M8000XA Age-related osteoporosis with current pathological fracture, unspecified site, initial encounter for fracture: Secondary | ICD-10-CM | POA: Diagnosis not present

## 2019-08-11 MED FILL — KEVZARA 200 MG/1.14ML SOSY: 200 | 28 days supply | Qty: 2 | Fill #2

## 2019-08-11 MED FILL — IBANDRONATE NA 150 MG TAB: 150 | 90 days supply | Qty: 3 | Fill #1

## 2019-08-11 MED FILL — HYDROCHLOROTHIAZIDE 25 MG T: 25 | 30 days supply | Qty: 30 | Fill #0

## 2019-08-17 MED FILL — CALCITONIN-SALMON 200 UNITS: 200 | 30 days supply | Qty: 4 | Fill #0

## 2019-08-19 MED FILL — CEPHALEXIN 500 MG CAPSULE: 500 | 5 days supply | Qty: 10 | Fill #0

## 2019-08-25 DIAGNOSIS — I10 Essential (primary) hypertension: Secondary | ICD-10-CM | POA: Diagnosis not present

## 2019-09-06 DIAGNOSIS — S83232A Complex tear of medial meniscus, current injury, left knee, initial encounter: Secondary | ICD-10-CM | POA: Diagnosis not present

## 2019-09-06 DIAGNOSIS — G8918 Other acute postprocedural pain: Secondary | ICD-10-CM | POA: Diagnosis not present

## 2019-09-06 DIAGNOSIS — M2342 Loose body in knee, left knee: Secondary | ICD-10-CM | POA: Diagnosis not present

## 2019-09-06 DIAGNOSIS — M958 Other specified acquired deformities of musculoskeletal system: Secondary | ICD-10-CM | POA: Diagnosis not present

## 2019-09-06 DIAGNOSIS — S83282A Other tear of lateral meniscus, current injury, left knee, initial encounter: Secondary | ICD-10-CM | POA: Diagnosis not present

## 2019-09-06 DIAGNOSIS — M1712 Unilateral primary osteoarthritis, left knee: Secondary | ICD-10-CM | POA: Diagnosis not present

## 2019-09-06 MED FILL — HYDROCHLOROTHIAZIDE 25 MG T: 25 | 30 days supply | Qty: 30 | Fill #1

## 2019-09-06 MED FILL — OXYCODONE-APAP 5-325MG: 5-325 | 7 days supply | Qty: 42 | Fill #0

## 2019-09-08 ENCOUNTER — Other Ambulatory Visit: Payer: Self-pay | Admitting: Pharmacist

## 2019-09-08 MED ORDER — KEVZARA 200 MG/1.14ML ~~LOC~~ SOSY: 1.0000 | PREFILLED_SYRINGE | SUBCUTANEOUS | 3 refills | Status: DC

## 2019-09-10 DIAGNOSIS — R300 Dysuria: Secondary | ICD-10-CM | POA: Diagnosis not present

## 2019-09-10 DIAGNOSIS — Z5181 Encounter for therapeutic drug level monitoring: Secondary | ICD-10-CM | POA: Diagnosis not present

## 2019-09-10 MED FILL — NITROFURANTOIN MONO-MCR 100: 100 | 7 days supply | Qty: 14 | Fill #0

## 2019-09-10 MED FILL — PHENAZOPYRIDINE 200 MG TAB: 200 | 2 days supply | Qty: 6 | Fill #0

## 2019-09-14 MED FILL — POTASSIUM CHLORIDE CRYS ER: 20 | 5 days supply | Qty: 5 | Fill #0

## 2019-09-16 MED FILL — CEPHALEXIN 500 MG CAPSULE: 500 | 7 days supply | Qty: 28 | Fill #0

## 2019-09-16 MED FILL — KEVZARA 200 MG/1.14ML SOSY: 200 | 28 days supply | Qty: 2 | Fill #0

## 2019-09-21 MED FILL — predniSONE 1 MG TABS: 1 | 30 days supply | Qty: 120 | Fill #1

## 2019-09-21 MED FILL — PANTOPRAZOLE SOD DR 40 MG T: 40 | 90 days supply | Qty: 180 | Fill #1

## 2019-09-21 MED FILL — VIT D2 1.25 MG (50,000 UNIT: 1.25 MG | 70 days supply | Qty: 10 | Fill #3

## 2019-09-22 ENCOUNTER — Other Ambulatory Visit (HOSPITAL_COMMUNITY): Payer: Self-pay | Admitting: Physician Assistant

## 2019-09-22 MED FILL — FOLIC ACID 1 MG TABS: 1 | 90 days supply | Qty: 270 | Fill #0

## 2019-09-24 DIAGNOSIS — G6289 Other specified polyneuropathies: Secondary | ICD-10-CM | POA: Diagnosis not present

## 2019-09-24 DIAGNOSIS — M0589 Other rheumatoid arthritis with rheumatoid factor of multiple sites: Secondary | ICD-10-CM | POA: Diagnosis not present

## 2019-09-24 DIAGNOSIS — R05 Cough: Secondary | ICD-10-CM | POA: Diagnosis not present

## 2019-09-24 DIAGNOSIS — N390 Urinary tract infection, site not specified: Secondary | ICD-10-CM | POA: Diagnosis not present

## 2019-09-24 DIAGNOSIS — M2550 Pain in unspecified joint: Secondary | ICD-10-CM | POA: Diagnosis not present

## 2019-09-24 DIAGNOSIS — R7989 Other specified abnormal findings of blood chemistry: Secondary | ICD-10-CM | POA: Diagnosis not present

## 2019-09-24 DIAGNOSIS — R252 Cramp and spasm: Secondary | ICD-10-CM | POA: Diagnosis not present

## 2019-09-24 DIAGNOSIS — Z79899 Other long term (current) drug therapy: Secondary | ICD-10-CM | POA: Diagnosis not present

## 2019-09-24 DIAGNOSIS — R5383 Other fatigue: Secondary | ICD-10-CM | POA: Diagnosis not present

## 2019-09-24 DIAGNOSIS — M150 Primary generalized (osteo)arthritis: Secondary | ICD-10-CM | POA: Diagnosis not present

## 2019-09-24 DIAGNOSIS — M8080XA Other osteoporosis with current pathological fracture, unspecified site, initial encounter for fracture: Secondary | ICD-10-CM | POA: Diagnosis not present

## 2019-09-27 MED FILL — DOXYCYCLINE HYCLATE 100 MG: 100 | 10 days supply | Qty: 20 | Fill #0

## 2019-09-28 ENCOUNTER — Other Ambulatory Visit (HOSPITAL_COMMUNITY)
Admission: RE | Admit: 2019-09-28 | Discharge: 2019-09-28 | Disposition: A | Discharge status: Home / Self Care | Payer: 59 | Source: Other Acute Inpatient Hospital | Attending: Specialist | Admitting: Specialist

## 2019-09-28 ENCOUNTER — Other Ambulatory Visit: Payer: Self-pay | Admitting: Endocrinology

## 2019-09-28 DIAGNOSIS — E049 Nontoxic goiter, unspecified: Secondary | ICD-10-CM

## 2019-09-28 DIAGNOSIS — Z4889 Encounter for other specified surgical aftercare: Secondary | ICD-10-CM | POA: Diagnosis not present

## 2019-10-01 MED FILL — SPIRONOLACTONE 25 MG TABS: 25 | 90 days supply | Qty: 90 | Fill #0

## 2019-10-03 LAB — AEROBIC/ANAEROBIC CULTURE W GRAM STAIN (SURGICAL/DEEP WOUND)
Culture: NO GROWTH
Gram Stain: NONE SEEN

## 2019-10-04 ENCOUNTER — Ambulatory Visit
Admission: RE | Admit: 2019-10-04 | Discharge: 2019-10-04 | Disposition: A | Discharge status: Home / Self Care | Payer: 59 | Source: Ambulatory Visit | Attending: Endocrinology | Admitting: Endocrinology

## 2019-10-04 DIAGNOSIS — E041 Nontoxic single thyroid nodule: Secondary | ICD-10-CM | POA: Diagnosis not present

## 2019-10-04 DIAGNOSIS — E049 Nontoxic goiter, unspecified: Secondary | ICD-10-CM

## 2019-10-08 ENCOUNTER — Other Ambulatory Visit: Payer: Self-pay | Admitting: Cardiology

## 2019-10-08 ENCOUNTER — Ambulatory Visit: Payer: 59 | Admitting: Cardiology

## 2019-10-08 ENCOUNTER — Encounter: Payer: Self-pay | Admitting: Cardiology

## 2019-10-08 ENCOUNTER — Other Ambulatory Visit: Payer: Self-pay

## 2019-10-08 VITALS — BP 138/78 | HR 74 | Ht 68.0 in | Wt 210.4 lb

## 2019-10-08 DIAGNOSIS — R072 Precordial pain: Secondary | ICD-10-CM | POA: Diagnosis not present

## 2019-10-08 DIAGNOSIS — R600 Localized edema: Secondary | ICD-10-CM | POA: Diagnosis not present

## 2019-10-08 DIAGNOSIS — R079 Chest pain, unspecified: Secondary | ICD-10-CM

## 2019-10-08 DIAGNOSIS — I10 Essential (primary) hypertension: Secondary | ICD-10-CM | POA: Diagnosis not present

## 2019-10-08 MED ORDER — SPIRONOLACTONE 25 MG PO TABS
50.0000 mg | ORAL_TABLET | Freq: Every day | ORAL | 5 refills | Status: DC
Start: 2019-10-08 — End: 2019-10-08

## 2019-10-08 MED ORDER — METOPROLOL TARTRATE 100 MG PO TABS: 100.0000 mg | ORAL_TABLET | Freq: Once | ORAL | 0 refills | Status: DC

## 2019-10-08 MED ORDER — HYDROCHLOROTHIAZIDE 12.5 MG PO CAPS: 12.5000 mg | ORAL_CAPSULE | Freq: Every day | ORAL | 5 refills | Status: DC

## 2019-10-08 MED FILL — METOPROLOL TARTRATE 100 MG: 100 | 1 days supply | Qty: 1 | Fill #0

## 2019-10-08 MED FILL — HYDROCHLOROTHIAZIDE 12.5 MG: 12.5 | 30 days supply | Qty: 30 | Fill #0

## 2019-10-08 NOTE — Progress Notes (Signed)
Cardiology Office Note:    Date:  10/08/2019   ID:  Olivia Rubio, DOB October 17, 1955, MRN [id number]  PCP:  Maurice Small, MD  [facility] Cardiologist:  Kate Sable, MD  [place] Electrophysiologist:  None   Referring MD: Maurice Small, MD   Chief Complaint  Patient presents with  . New Patient (Initial Visit)    Self ref for chest pain, occas. palpitations & LE edema. Pt. was seen by [facility] 20 years ago for palpitations. Meds reviewed by the pt. verbally.     History of Present Illness:    Olivia Rubio is a 64 y.o. female with a hx of hypertension, GERD, rheumatoid arthritis who presents due to chest pain, lower extremity edema.  Patient states having symptoms of chest pain for over 6 months now.  She is worried this may be her heart.  Symptoms are not related with exertion, she rates it as 2 out of 10 when it happens.  Symptoms typically last couple of minutes and then resolve but can sometimes be persistent.  She has also noticed lower extremity edema for several months now.  Saw her primary care provider who started her on HCTZ 25 mg daily.  Her symptoms improved, potassium was however low and patient developed bladder irritation.  Aldactone was started, but has not helped with her symptoms of edema.  She has noticed her blood pressure being elevated over the last couple of months.  Systolic is usually in the 130s to 140s.  Past Medical History:  Diagnosis Date  . Acid reflux   . Arthritis   . Baker's cyst of knee   . Change in voice   . Cough   . Endometriosis   . H/O: hysterectomy   . Hypothyroidism   . Nasal congestion   . Osteoporosis   . Thyroid nodule     Past Surgical History:  Procedure Laterality Date  . ABDOMINAL HYSTERECTOMY  1988  . SINUS SURGERY WITH INSTATRAK  2008    Current Medications: Current Meds  Medication Sig  . calcitonin, salmon, (MIACALCIN/FORTICAL) 200 UNIT/ACT nasal spray 1 spray daily.  . cyclobenzaprine (FLEXERIL)  10 MG tablet Take 10 mg by mouth 3 (three) times daily as needed.   . folic acid (FOLVITE) 1 MG tablet Take 1 mg by mouth 3 (three) times daily.   Marland Kitchen ibuprofen (ADVIL) 800 MG tablet Take 800 mg by mouth every 6 (six) hours as needed.   . pantoprazole (PROTONIX) 40 MG tablet Take 40 mg by mouth daily.    . predniSONE (DELTASONE) 5 MG tablet Take 5 mg by mouth daily with breakfast.   . Sarilumab (KEVZARA) 200 MG/1.14ML SOSY Inject 1 Syringe into the skin every 14 (fourteen) days.  Marland Kitchen spironolactone (ALDACTONE) 25 MG tablet Take 2 tablets (50 mg total) by mouth daily.  . Vitamin D, Ergocalciferol, (DRISDOL) 1.25 MG (50000 UNIT) CAPS capsule Take 50,000 Units by mouth once a week.  . [DISCONTINUED] spironolactone (ALDACTONE) 25 MG tablet Take 25 mg by mouth daily.     Allergies:   Moxifloxacin   Social History   Socioeconomic History  . Marital status: Married    Spouse name: Not on file  . Number of children: Not on file  . Years of education: Not on file  . Highest education level: Not on file  Occupational History  . Not on file  Tobacco Use  . Smoking status: Never Smoker  . Smokeless tobacco: Never Used  Vaping Use  .  Vaping Use: Never used  Substance and Sexual Activity  . Alcohol use: No  . Drug use: No  . Sexual activity: Not on file  Other Topics Concern  . Not on file  Social History Narrative  . Not on file   Social Determinants of Health   Financial Resource Strain:   . Difficulty of Paying Living Expenses: Not on file  Food Insecurity:   . Worried About Charity fundraiser in the Last Year: Not on file  . Ran Out of Food in the Last Year: Not on file  Transportation Needs:   . Lack of Transportation (Medical): Not on file  . Lack of Transportation (Non-Medical): Not on file  Physical Activity:   . Days of Exercise per Week: Not on file  . Minutes of Exercise per Session: Not on file  Stress:   . Feeling of Stress : Not on file  Social Connections:   .  Frequency of Communication with Friends and Family: Not on file  . Frequency of Social Gatherings with Friends and Family: Not on file  . Attends Religious Services: Not on file  . Active Member of Clubs or Organizations: Not on file  . Attends Archivist Meetings: Not on file  . Marital Status: Not on file     Family History: The patient's family history includes Arthritis in her sister; Breast cancer in her mother; Cancer in her brother, maternal aunt, maternal uncle, mother, and paternal grandmother; Diabetes in her father; Thyroid disease in her sister.  ROS:   Please see the history of present illness.     All other systems reviewed and are negative.  EKGs/Labs/Other Studies Reviewed:    The following studies were reviewed today:   EKG:  EKG is  ordered today.  The ekg ordered today demonstrates normal sinus rhythm.  Recent Labs: No results found for requested labs within last 8760 hours.  Recent Lipid Panel No results found for: CHOL, TRIG, HDL, CHOLHDL, VLDL, LDLCALC, LDLDIRECT  Physical Exam:    VS:  BP 138/78 (BP Location: Right Arm, Patient Position: Sitting, Cuff Size: Normal)   Pulse 74   Ht '5\' 8"'  (1.727 m)   Wt 210 lb 6 oz (95.4 kg)   SpO2 98%   BMI 31.99 kg/m     Wt Readings from Last 3 Encounters:  10/08/19 210 lb 6 oz (95.4 kg)  01/24/17 214 lb (97.1 kg)  11/12/16 206 lb (93.4 kg)     GEN:  Well nourished, well developed in no acute distress HEENT: Normal NECK: No JVD; No carotid bruits LYMPHATICS: No lymphadenopathy CARDIAC: RRR, no murmurs, rubs, gallops RESPIRATORY:  Clear to auscultation without rales, wheezing or rhonchi  ABDOMEN: Soft, non-tender, non-distended MUSCULOSKELETAL:  1+ edema; No deformity  SKIN: Warm and dry NEUROLOGIC:  Alert and oriented x 3 PSYCHIATRIC:  Normal affect   ASSESSMENT:    1. Chest pain of uncertain etiology   2. Edema leg   3. Essential hypertension   4. Precordial pain    PLAN:    In order of  problems listed above:  1. Patient with history of chest pain.  Has risk factors of age, hypertension and rheumatoid arthritis.  Will get coronary CTA to evaluate for presence of CAD.  Get echocardiogram to evaluate cardiac dysfunction. 2. Bilateral lower extremity edema.  Get echocardiogram as above.  Start HCTZ 12.5 mg daily as this is worked in the past.  Continue spironolactone 25 mg daily to help offset  potassium loss and also hypertension.  Check BMP in 1 week. 3. History of hypertension, BP better today.  HCTZ and spironolactone as above.  Follow-up after echocardiogram and coronary CTA.  Total encounter time 60 minutes  Greater than 50% was spent in counseling and coordination of care with the patient  Medication Adjustments/Labs and Tests Ordered: Current medicines are reviewed at length with the patient today.  Concerns regarding medicines are outlined above.  Orders Placed This Encounter  Procedures  . CT CORONARY MORPH W/CTA COR W/SCORE W/CA W/CM &/OR WO/CM  . CT CORONARY FRACTIONAL FLOW RESERVE DATA PREP  . CT CORONARY FRACTIONAL FLOW RESERVE FLUID ANALYSIS  . Basic metabolic panel  . EKG 12-Lead  . ECHOCARDIOGRAM COMPLETE   Meds ordered this encounter  Medications  . metoprolol tartrate (LOPRESSOR) 100 MG tablet    Sig: Take 1 tablet (100 mg total) by mouth once for 1 dose. Take 2 hours prior to your CT scan.    Dispense:  1 tablet    Refill:  0  . hydrochlorothiazide (MICROZIDE) 12.5 MG capsule    Sig: Take 1 capsule (12.5 mg total) by mouth daily.    Dispense:  30 capsule    Refill:  5  . spironolactone (ALDACTONE) 25 MG tablet    Sig: Take 2 tablets (50 mg total) by mouth daily.    Dispense:  60 tablet    Refill:  5    Patient Instructions  Medication Instructions:  Your physician has recommended you make the following change in your medication:   1)  START taking hydrochlorothiazide (MICROZIDE) 12.5 MG capsule: Take 1 capsule (12.5 mg total) by mouth  daily. 2)  SEE day of CTA procedure for pre medication instructions.  *If you need a refill on your cardiac medications before your next appointment, please call your pharmacy*   Lab Work:   Your physician recommends that you return for lab work in: 1 week. - Please go to the [facility] You will check in at the front desk to the right as you walk into the atrium. Valet Parking is offered if needed. - No appointment needed. You may go any day between 7 am and 6 pm.   Testing/Procedures:  1)  Your physician has requested that you have an echocardiogram. Echocardiography is a painless test that uses sound waves to create images of your heart. It provides your doctor with information about the size and shape of your heart and how well your heart's chambers and valves are working. This procedure takes approximately one hour. There are no restrictions for this procedure.  2)  Your physician has requested that you have cardiac CT. Cardiac computed tomography (CT) is a painless test that uses an x-ray machine to take clear, detailed pictures of your heart.    Your cardiac CT will be scheduled at:  [facility] [address] [place], [place] [address] [phone]  Please arrive 15 mins early for check-in and test prep.    Please follow these instructions carefully (unless otherwise directed):   On the Night Before the Test: . Be sure to Drink plenty of water. . Do not consume any caffeinated/decaffeinated beverages or chocolate 12 hours prior to your test. . Do not take any antihistamines 12 hours prior to your test.   On the Day of the Test: . Drink plenty of water. Do not drink any water within one hour of the test. . Do not eat any food  4 hours prior to the test. . You may take your regular medications prior to the test.  . Take metoprolol (Lopressor) two hours prior to test. . HOLD Hydrochlorothiazide morning of the  test. . FEMALES- please wear underwire-free bra if available        After the Test: . Drink plenty of water. . After receiving IV contrast, you may experience a mild flushed feeling. This is normal. . On occasion, you may experience a mild rash up to 24 hours after the test. This is not dangerous. If this occurs, you can take Benadryl 25 mg and increase your fluid intake. . If you experience trouble breathing, this can be serious. If it is severe call 911 IMMEDIATELY. If it is mild, please call our office. . If you take any of these medications: Glipizide/Metformin, Avandament, Glucavance, please do not take 48 hours after completing test unless otherwise instructed.   Once we have confirmed authorization from your insurance company, we will call you to set up a date and time for your test. Based on how quickly your insurance processes prior authorizations requests, please allow up to 4 weeks to be contacted for scheduling your Cardiac CT appointment. Be advised that routine Cardiac CT appointments could be scheduled as many as 8 weeks after your provider has ordered it.  For non-scheduling related questions, please contact the cardiac imaging nurse navigator should you have any questions/concerns: Marchia Bond, Cardiac Imaging Nurse Navigator Burley Saver, Interim Cardiac Imaging Nurse Navigator [place] [facility] Direct Office Dial: [phone]   **For scheduling needs, including cancellations and rescheduling, please call Vivien Rota at [phone], option 3.     Follow-Up: At [facility], you and your health needs are our priority.  As part of our continuing mission to provide you with exceptional heart care, we have created designated Provider Care Teams.  These Care Teams include your primary Cardiologist (physician) and Advanced Practice Providers (APPs -  Physician Assistants and Nurse Practitioners) who all work together to provide you with the care you need, when  you need it.  We recommend signing up for the patient portal called "MyChart".  Sign up information is provided on this After Visit Summary.  MyChart is used to connect with patients for Virtual Visits (Telemedicine).  Patients are able to view lab/test results, encounter notes, upcoming appointments, etc.  Non-urgent messages can be sent to your provider as well.   To learn more about what you can do with MyChart, go to [email or web address].    Your next appointment:   Follow up after Echo and CTA   The format for your next appointment:   In Person  Provider:   Kate Sable, MD   Other Instructions      Signed, Kate Sable, MD  10/08/2019 4:24 PM    [place]

## 2019-10-08 NOTE — Patient Instructions (Signed)
Medication Instructions:  Your physician has recommended you make the following change in your medication:   1)  START taking hydrochlorothiazide (MICROZIDE) 12.5 MG capsule: Take 1 capsule (12.5 mg total) by mouth daily. 2)  SEE day of CTA procedure for pre medication instructions.  *If you need a refill on your cardiac medications before your next appointment, please call your pharmacy*   Lab Work:   Your physician recommends that you return for lab work in: 1 week. - Please go to the [facility]. You will check in at the front desk to the right as you walk into the atrium. Valet Parking is offered if needed. - No appointment needed. You may go any day between 7 am and 6 pm.   Testing/Procedures:  1)  Your physician has requested that you have an echocardiogram. Echocardiography is a painless test that uses sound waves to create images of your heart. It provides your doctor with information about the size and shape of your heart and how well your heart's chambers and valves are working. This procedure takes approximately one hour. There are no restrictions for this procedure.  2)  Your physician has requested that you have cardiac CT. Cardiac computed tomography (CT) is a painless test that uses an x-ray machine to take clear, detailed pictures of your heart.    Your cardiac CT will be scheduled at:  [facility] [address] [place], [place] [address] [phone]  Please arrive 15 mins early for check-in and test prep.    Please follow these instructions carefully (unless otherwise directed):   On the Night Before the Test: . Be sure to Drink plenty of water. . Do not consume any caffeinated/decaffeinated beverages or chocolate 12 hours prior to your test. . Do not take any antihistamines 12 hours prior to your test.   On the Day of the Test: . Drink plenty of water. Do not drink any water within one hour of the  test. . Do not eat any food 4 hours prior to the test. . You may take your regular medications prior to the test.  . Take metoprolol (Lopressor) two hours prior to test. . HOLD Hydrochlorothiazide morning of the test. . FEMALES- please wear underwire-free bra if available        After the Test: . Drink plenty of water. . After receiving IV contrast, you may experience a mild flushed feeling. This is normal. . On occasion, you may experience a mild rash up to 24 hours after the test. This is not dangerous. If this occurs, you can take Benadryl 25 mg and increase your fluid intake. . If you experience trouble breathing, this can be serious. If it is severe call 911 IMMEDIATELY. If it is mild, please call our office. . If you take any of these medications: Glipizide/Metformin, Avandament, Glucavance, please do not take 48 hours after completing test unless otherwise instructed.   Once we have confirmed authorization from your insurance company, we will call you to set up a date and time for your test. Based on how quickly your insurance processes prior authorizations requests, please allow up to 4 weeks to be contacted for scheduling your Cardiac CT appointment. Be advised that routine Cardiac CT appointments could be scheduled as many as 8 weeks after your provider has ordered it.  For non-scheduling related questions, please contact the cardiac imaging nurse navigator should you have any questions/concerns: Marchia Bond, Cardiac Imaging Nurse Navigator Burley Saver,  Interim Cardiac Imaging Nurse Navigator [place] [facility] Direct Office Dial: [phone]   **For scheduling needs, including cancellations and rescheduling, please call Vivien Rota at [phone], option 3.     Follow-Up: At [facility], you and your health needs are our priority.  As part of our continuing mission to provide you with exceptional heart care, we have created designated Provider Care Teams.   These Care Teams include your primary Cardiologist (physician) and Advanced Practice Providers (APPs -  Physician Assistants and Nurse Practitioners) who all work together to provide you with the care you need, when you need it.  We recommend signing up for the patient portal called "MyChart".  Sign up information is provided on this After Visit Summary.  MyChart is used to connect with patients for Virtual Visits (Telemedicine).  Patients are able to view lab/test results, encounter notes, upcoming appointments, etc.  Non-urgent messages can be sent to your provider as well.   To learn more about what you can do with MyChart, go to [email or web address].    Your next appointment:   Follow up after Echo and CTA   The format for your next appointment:   In Person  Provider:   Kate Sable, MD   Other Instructions

## 2019-10-13 ENCOUNTER — Other Ambulatory Visit (HOSPITAL_COMMUNITY): Payer: Self-pay | Admitting: Specialist

## 2019-10-13 MED FILL — HYDROCODON-APAP 5-325: 5-325 | 5 days supply | Qty: 10 | Fill #0

## 2019-10-13 MED FILL — predniSONE 5 MG TABS: 5 | 6 days supply | Qty: 21 | Fill #0

## 2019-10-14 ENCOUNTER — Ambulatory Visit
Admission: RE | Admit: 2019-10-14 | Discharge: 2019-10-14 | Disposition: A | Discharge status: Home / Self Care | Payer: 59 | Source: Ambulatory Visit | Attending: Cardiology | Admitting: Cardiology

## 2019-10-14 ENCOUNTER — Other Ambulatory Visit: Payer: Self-pay

## 2019-10-14 DIAGNOSIS — R072 Precordial pain: Secondary | ICD-10-CM | POA: Insufficient documentation

## 2019-10-14 MED ORDER — IOHEXOL 350 MG/ML SOLN
85.0000 mL | Freq: Once | INTRAVENOUS | Status: AC | PRN
  Administered 2019-10-14: 85 mL via INTRAVENOUS

## 2019-10-14 MED ORDER — NITROGLYCERIN 0.4 MG SL SUBL
0.8000 mg | SUBLINGUAL_TABLET | Freq: Once | SUBLINGUAL | Status: AC
  Administered 2019-10-14: 0.8 mg via SUBLINGUAL

## 2019-10-14 NOTE — Progress Notes (Signed)
Patient tolerated CT well. Drank water after. Ambulated to exit steady gait.  

## 2019-10-15 ENCOUNTER — Other Ambulatory Visit: Payer: Self-pay

## 2019-10-15 ENCOUNTER — Other Ambulatory Visit
Admission: RE | Admit: 2019-10-15 | Discharge: 2019-10-15 | Disposition: A | Discharge status: Home / Self Care | Payer: 59 | Attending: Cardiology | Admitting: Cardiology

## 2019-10-15 DIAGNOSIS — R079 Chest pain, unspecified: Secondary | ICD-10-CM | POA: Insufficient documentation

## 2019-10-15 LAB — BASIC METABOLIC PANEL
Anion gap: 7 (ref 5–15)
BUN: 17 mg/dL (ref 8–23)
CO2: 26 mmol/L (ref 22–32)
Calcium: 9 mg/dL (ref 8.9–10.3)
Chloride: 104 mmol/L (ref 98–111)
Creatinine, Ser: 0.79 mg/dL (ref 0.44–1.00)
GFR calc Af Amer: >60 mL/min (ref >60)
GFR calc non Af Amer: >60 mL/min (ref >60)
Glucose, Bld: 105 mg/dL — ABNORMAL HIGH (ref 70–99)
Potassium: 3.7 mmol/L (ref 3.5–5.1)
Sodium: 137 mmol/L (ref 135–145)

## 2019-10-21 ENCOUNTER — Other Ambulatory Visit (HOSPITAL_COMMUNITY): Payer: Self-pay | Admitting: Physician Assistant

## 2019-10-21 MED FILL — KEVZARA 200 MG/1.14ML SOSY: 200 | 28 days supply | Qty: 2 | Fill #1

## 2019-10-21 MED FILL — predniSONE 5 MG TABS: 5 | 6 days supply | Qty: 21 | Fill #1

## 2019-10-25 DIAGNOSIS — R399 Unspecified symptoms and signs involving the genitourinary system: Secondary | ICD-10-CM | POA: Diagnosis not present

## 2019-10-25 DIAGNOSIS — Z5181 Encounter for therapeutic drug level monitoring: Secondary | ICD-10-CM | POA: Diagnosis not present

## 2019-11-04 IMAGING — MG 2D DIGITAL SCREENING BILATERAL MAMMOGRAM WITH 3D TOMO WITH CAD
8 of 12 series · 8 of 28 positions shown · non-contrast
Comparison: Previous exam(s).

CLINICAL DATA: Screening.

EXAM:
2D DIGITAL SCREENING BILATERAL MAMMOGRAM WITH 3D TOMO WITH CAD

[L MLO]
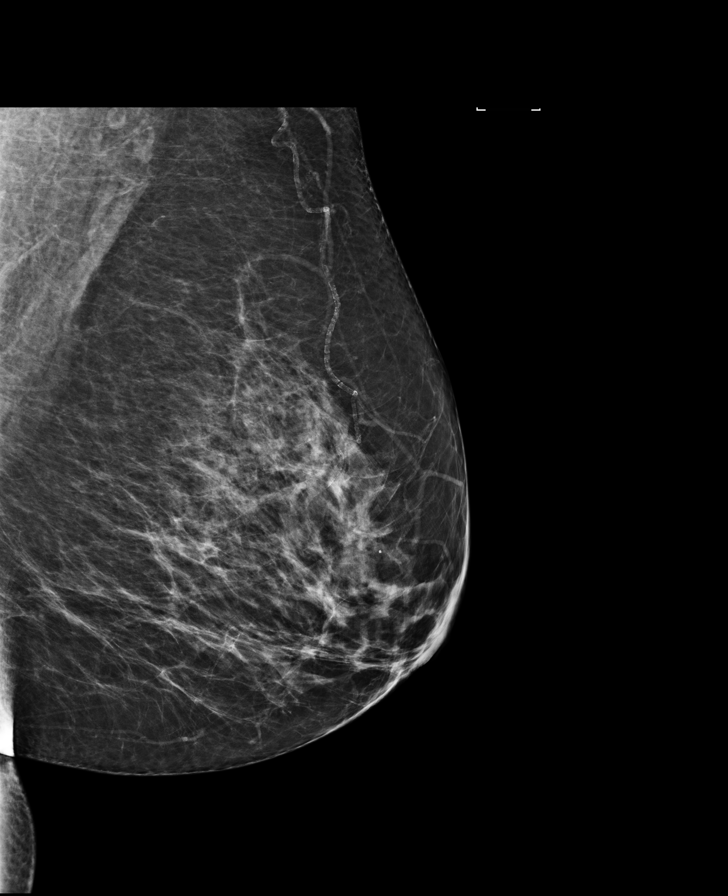

[R CC synth-2D]
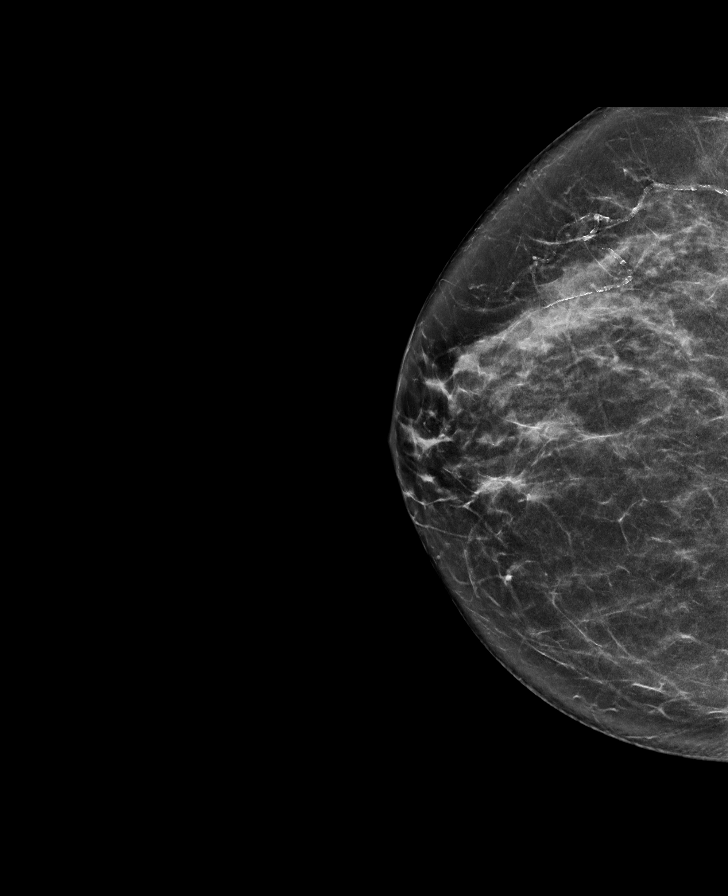

[R MLO]
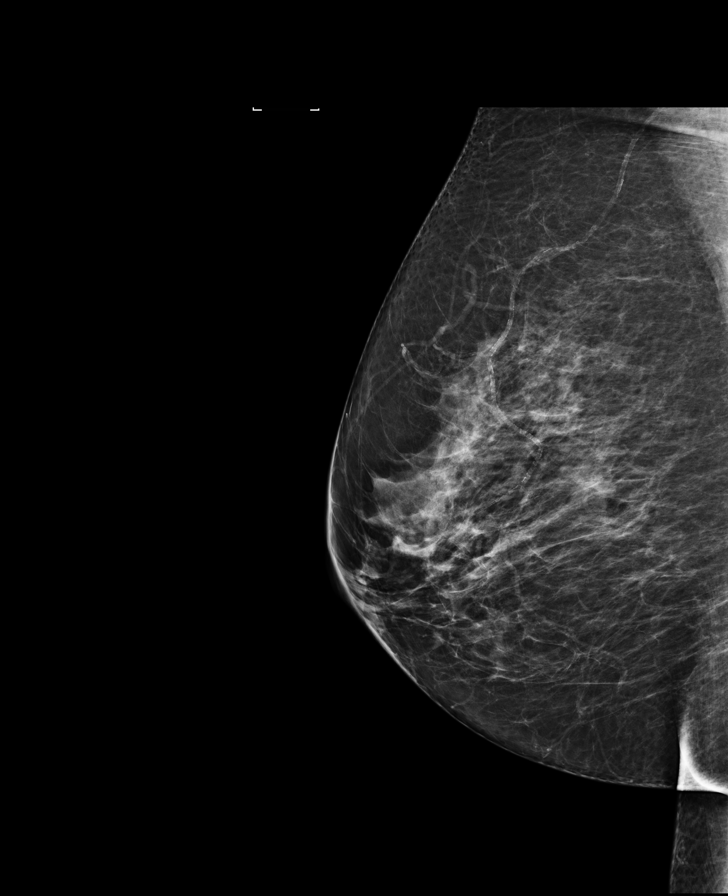

[R CC]
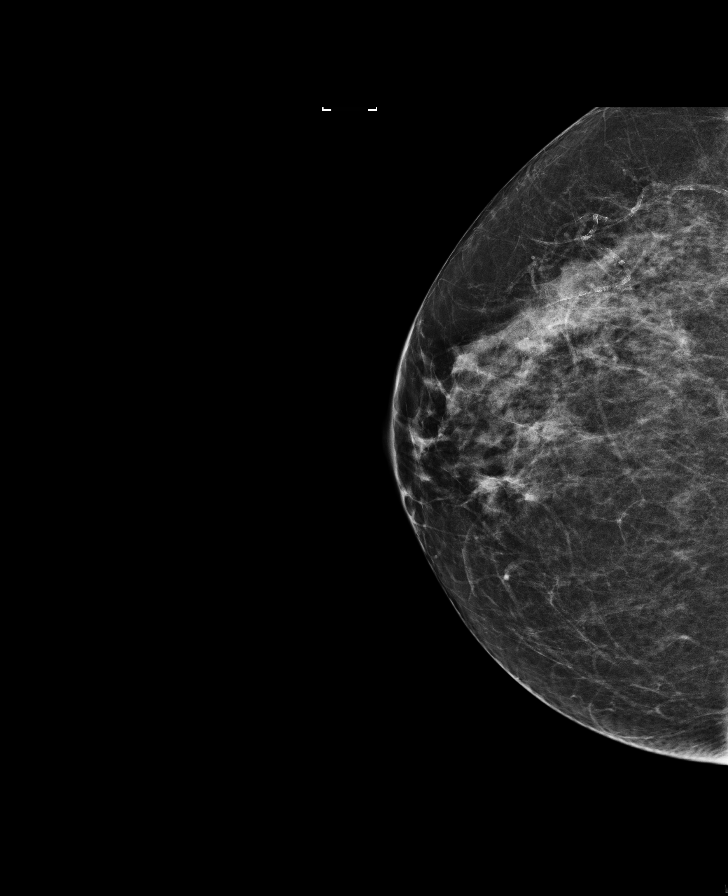

[L CC synth-2D]
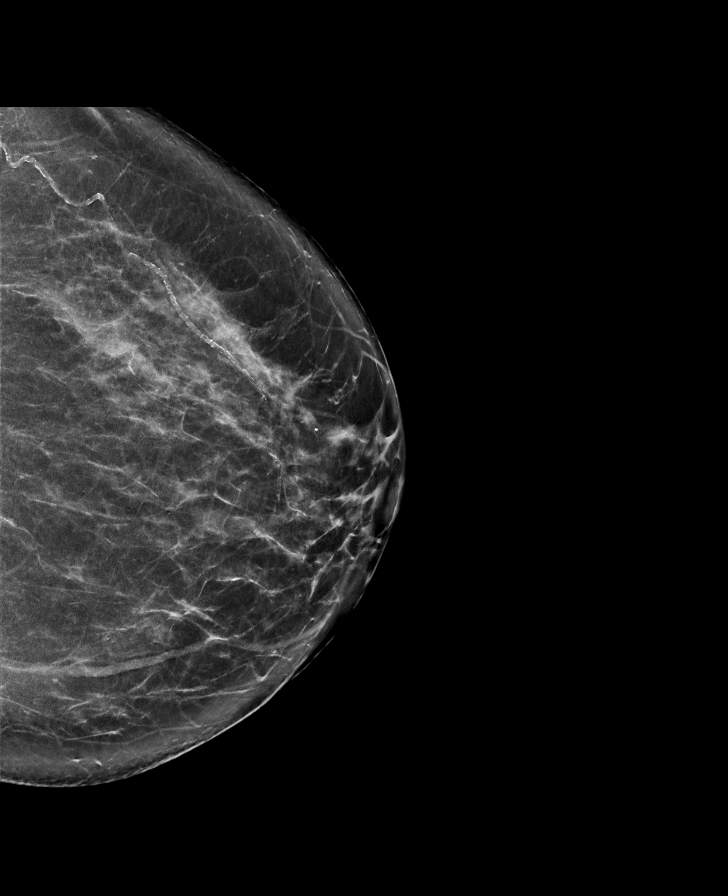

[L MLO synth-2D]
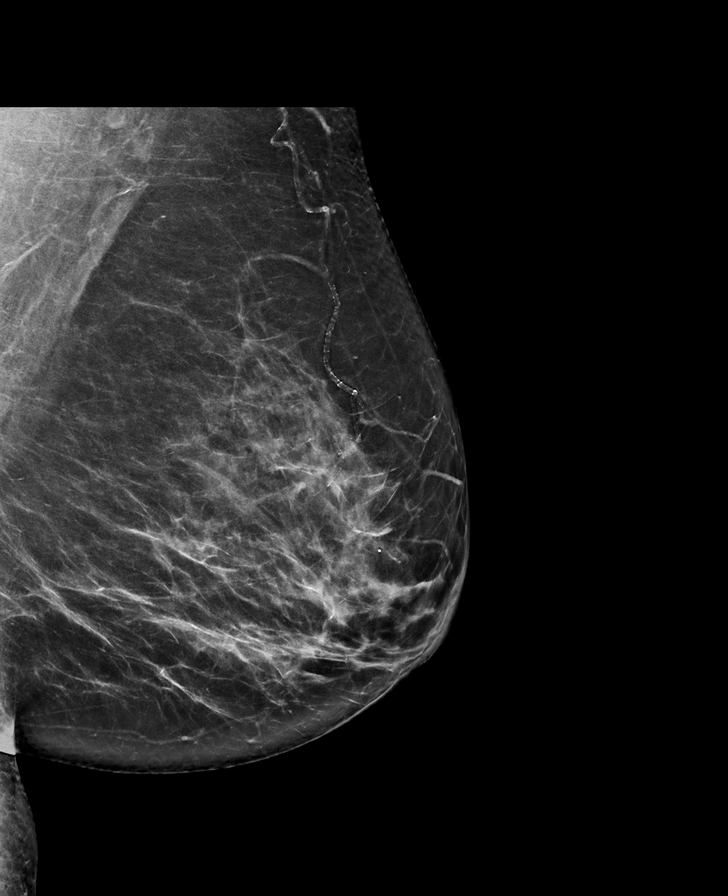

[L CC]
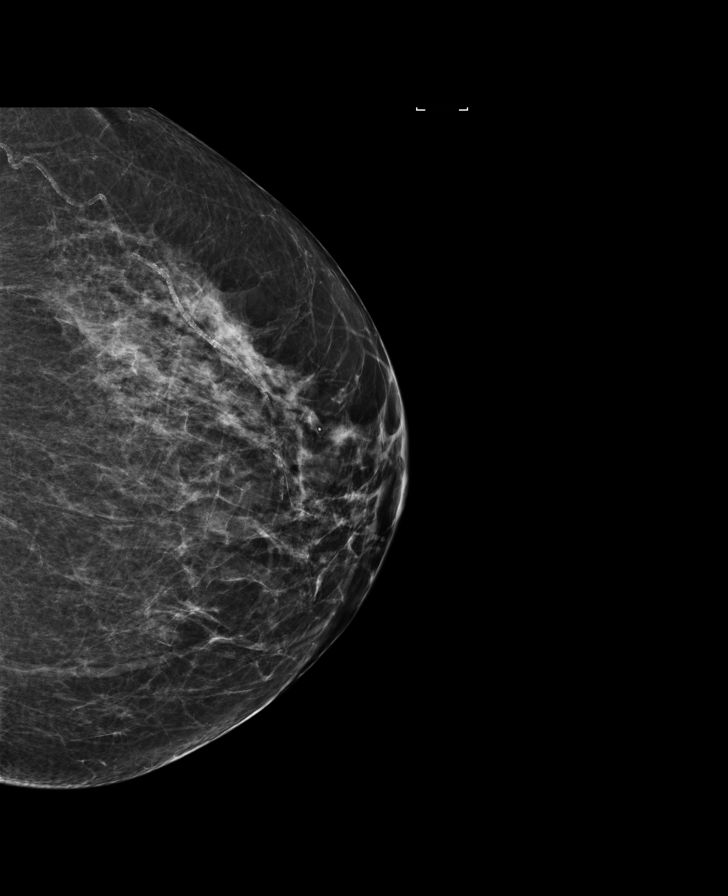

[R MLO synth-2D]
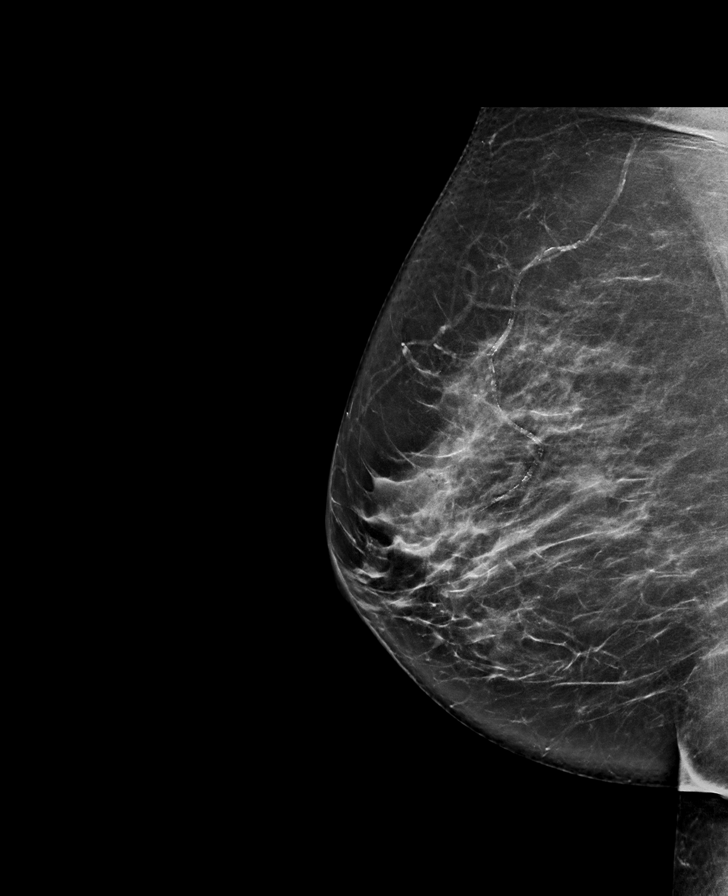

[8 of 28 positions shown; findings below may reference images not displayed]

ACR Breast Density Category c: The breast tissue is heterogeneously
dense, which may obscure small masses.
FINDINGS: There are no findings suspicious for malignancy. Images were
processed with CAD.
IMPRESSION: No mammographic evidence of malignancy. A result letter of this
screening mammogram will be mailed directly to the patient.

RECOMMENDATION:
Screening mammogram in one year. (Code:[id number])

BI-RADS CATEGORY  1: Negative.

## 2019-11-05 ENCOUNTER — Other Ambulatory Visit: Payer: Self-pay

## 2019-11-05 ENCOUNTER — Ambulatory Visit (INDEPENDENT_AMBULATORY_CARE_PROVIDER_SITE_OTHER): Payer: 59

## 2019-11-05 DIAGNOSIS — R079 Chest pain, unspecified: Secondary | ICD-10-CM

## 2019-11-05 LAB — ECHOCARDIOGRAM COMPLETE
AR max vel: 2.72 cm2
AV Area VTI: 2.69 cm2
AV Area mean vel: 2.47 cm2
AV Mean grad: 2 mmHg
AV Peak grad: 3.3 mmHg
Ao pk vel: 0.91 m/s
Area-P 1/2: 5.2 cm2
Calc EF: 57.6 %
S' Lateral: 3.1 cm
Single Plane A2C EF: 58.8 %
Single Plane A4C EF: 57 %

## 2019-11-08 MED FILL — SPIRONOLACTONE 25 MG TABS: 25 | 30 days supply | Qty: 60 | Fill #0

## 2019-11-09 MED FILL — HYDROCHLOROTHIAZIDE 12.5 MG: 12.5 | 30 days supply | Qty: 30 | Fill #1

## 2019-11-09 MED FILL — predniSONE 5 MG TABS: 5 | 90 days supply | Qty: 90 | Fill #0

## 2019-11-12 DIAGNOSIS — E049 Nontoxic goiter, unspecified: Secondary | ICD-10-CM | POA: Diagnosis not present

## 2019-11-12 DIAGNOSIS — E213 Hyperparathyroidism, unspecified: Secondary | ICD-10-CM | POA: Diagnosis not present

## 2019-11-12 DIAGNOSIS — E559 Vitamin D deficiency, unspecified: Secondary | ICD-10-CM | POA: Diagnosis not present

## 2019-11-12 DIAGNOSIS — M810 Age-related osteoporosis without current pathological fracture: Secondary | ICD-10-CM | POA: Diagnosis not present

## 2019-11-12 DIAGNOSIS — M069 Rheumatoid arthritis, unspecified: Secondary | ICD-10-CM | POA: Diagnosis not present

## 2019-11-18 MED FILL — KEVZARA 200 MG/1.14ML SOSY: 200 | 28 days supply | Qty: 2 | Fill #2

## 2019-11-19 ENCOUNTER — Ambulatory Visit: Payer: 59 | Admitting: Cardiology

## 2019-11-26 ENCOUNTER — Other Ambulatory Visit (HOSPITAL_COMMUNITY): Payer: Self-pay | Admitting: Endocrinology

## 2019-11-26 MED FILL — PROLIA 60 MG/ML SOLN: 60 | 180 days supply | Qty: 1 | Fill #0

## 2019-11-29 DIAGNOSIS — M21612 Bunion of left foot: Secondary | ICD-10-CM | POA: Diagnosis not present

## 2019-11-29 DIAGNOSIS — S92505A Nondisplaced unspecified fracture of left lesser toe(s), initial encounter for closed fracture: Secondary | ICD-10-CM | POA: Diagnosis not present

## 2019-11-29 DIAGNOSIS — M79672 Pain in left foot: Secondary | ICD-10-CM | POA: Diagnosis not present

## 2019-11-30 DIAGNOSIS — Z4889 Encounter for other specified surgical aftercare: Secondary | ICD-10-CM | POA: Diagnosis not present

## 2019-11-30 DIAGNOSIS — M810 Age-related osteoporosis without current pathological fracture: Secondary | ICD-10-CM | POA: Diagnosis not present

## 2019-11-30 DIAGNOSIS — M25562 Pain in left knee: Secondary | ICD-10-CM | POA: Diagnosis not present

## 2019-12-15 MED FILL — KEVZARA 200 MG/1.14ML SOSY: 200 | 28 days supply | Qty: 2 | Fill #3

## 2019-12-20 MED FILL — FOLIC ACID 1 MG TABS: 1 | 90 days supply | Qty: 270 | Fill #1

## 2019-12-20 MED FILL — PANTOPRAZOLE SOD DR 40 MG T: 40 | 45 days supply | Qty: 90 | Fill #2

## 2019-12-24 ENCOUNTER — Other Ambulatory Visit (HOSPITAL_COMMUNITY): Payer: Self-pay | Admitting: Physician Assistant

## 2019-12-24 DIAGNOSIS — M8080XA Other osteoporosis with current pathological fracture, unspecified site, initial encounter for fracture: Secondary | ICD-10-CM | POA: Diagnosis not present

## 2019-12-24 DIAGNOSIS — M545 Low back pain, unspecified: Secondary | ICD-10-CM | POA: Diagnosis not present

## 2019-12-24 DIAGNOSIS — M2550 Pain in unspecified joint: Secondary | ICD-10-CM | POA: Diagnosis not present

## 2019-12-24 DIAGNOSIS — R053 Chronic cough: Secondary | ICD-10-CM | POA: Diagnosis not present

## 2019-12-24 DIAGNOSIS — M0589 Other rheumatoid arthritis with rheumatoid factor of multiple sites: Secondary | ICD-10-CM | POA: Diagnosis not present

## 2019-12-24 DIAGNOSIS — R7989 Other specified abnormal findings of blood chemistry: Secondary | ICD-10-CM | POA: Diagnosis not present

## 2019-12-24 DIAGNOSIS — G6289 Other specified polyneuropathies: Secondary | ICD-10-CM | POA: Diagnosis not present

## 2019-12-24 DIAGNOSIS — M150 Primary generalized (osteo)arthritis: Secondary | ICD-10-CM | POA: Diagnosis not present

## 2019-12-24 DIAGNOSIS — Z79899 Other long term (current) drug therapy: Secondary | ICD-10-CM | POA: Diagnosis not present

## 2019-12-24 MED FILL — predniSONE 1 MG TABS: 1 | 30 days supply | Qty: 120 | Fill #0

## 2019-12-27 DIAGNOSIS — M79672 Pain in left foot: Secondary | ICD-10-CM | POA: Diagnosis not present

## 2019-12-27 DIAGNOSIS — S92912D Unspecified fracture of left toe(s), subsequent encounter for fracture with routine healing: Secondary | ICD-10-CM | POA: Diagnosis not present

## 2019-12-27 DIAGNOSIS — M21612 Bunion of left foot: Secondary | ICD-10-CM | POA: Diagnosis not present

## 2020-01-03 ENCOUNTER — Telehealth: Payer: Self-pay | Admitting: Pharmacist

## 2020-01-03 ENCOUNTER — Other Ambulatory Visit: Payer: Self-pay | Admitting: Internal Medicine

## 2020-01-03 NOTE — Telephone Encounter (Signed)
Called patient to schedule an appointment for the [employer] [facility]. I was unable to reach the patient so I left a HIPAA-compliant message requesting that the patient return my call.   

## 2020-01-05 ENCOUNTER — Other Ambulatory Visit: Payer: Self-pay

## 2020-01-05 ENCOUNTER — Ambulatory Visit (HOSPITAL_BASED_OUTPATIENT_CLINIC_OR_DEPARTMENT_OTHER): Payer: 59 | Admitting: Pharmacist

## 2020-01-05 ENCOUNTER — Other Ambulatory Visit: Payer: Self-pay | Admitting: Internal Medicine

## 2020-01-05 DIAGNOSIS — Z79899 Other long term (current) drug therapy: Secondary | ICD-10-CM

## 2020-01-05 MED ORDER — KEVZARA 200 MG/1.14ML ~~LOC~~ SOSY: PREFILLED_SYRINGE | SUBCUTANEOUS | 3 refills | Status: DC

## 2020-01-05 NOTE — Progress Notes (Signed)
S: Patient presents for review of their specialty medication therapy.  Patient is currently taking Kevzara for rheumatoid arthritis. Patient is managed by [name] for this.   Adherence: denies any missed doses  FDA-approved dosing:Rheumatoid arthritis: SubQ: 200 mg once every 2 weeks.  Efficacy: feels that it still works okay for her.   Hematologic toxicity: ANC >1,000/mm3: No dosage adjustment necessary. ANC 500 to 1,000/mm3: Interrupt therapy; when [place] >1,000/mm3, resume at 150 mg once every 2 weeks (may increase to 200 mg once every 2 weeks as clinically appropriate). ANC <500/mm3: Discontinue. Platelets 50,000 to 100,000/mm3: Interrupt therapy; when platelet count is >100,000/mm3, resume at 150 mg once every 2 weeks (may increase to 200 mg once every 2 weeks as clinically appropriate). Platelets <50,000/mm3: If confirmed by repeat testing, discontinue. Nonhematologic toxicity: Hypersensitivity (anaphylaxis or other clinically significant hypersensitivity reaction): Stop immediately and discontinue permanently. Infection (serious): Interrupt treatment until the infection is controlled.   Drug-drug interactions: none  Screenings: TB screening: completed per patient Hepatitis Screening: completed per patient  Monitoring: S/sx of infection: denies S/sx of hypersensitivity: denies Injection site reactions: denies Other adverse effects:denies  O: Followed by RA. Reports blood work WNL  Lab Results  Component Value Date   WBC 3.8 (L) 09/30/2016   HGB 14.0 09/30/2016   HCT 42.1 09/30/2016   MCV 102.3 (H) 09/30/2016   PLT 149.0 (L) 09/30/2016      Chemistry      Component Value Date/Time   NA 137 10/15/2019 1002   K 3.7 10/15/2019 1002   CL 104 10/15/2019 1002   CO2 26 10/15/2019 1002   BUN 17 10/15/2019 1002   CREATININE 0.79 10/15/2019 1002      Component Value Date/Time   CALCIUM 9.0 10/15/2019 1002      A/P: 1. Medication review: Patient currently on  Kevzara for the treatment of rheumatoid arthritis and is tolerating it well. She reports okay control of RA.  Reviewed the medication with the patient, including the following: Sarilumab is an interleukin-6 (IL-6) receptor antagonist which binds to both soluble and membrane-bound IL-6 receptors. Endogenous IL-6 is induced by inflammatory stimuli and mediates a variety of immunological responses. IL-6 produced by synovial and endothelial cells leads to local production of IL-6 in joints affected by inflammatory processes such as rheumatoid arthritis. Inhibition of IL-6 receptors by sarilumab leads to a reduction in CRP levels. The most common adverse effects include increased risk of infection, injection site reactions, and neutropenia. There is a possible adverse effect of increased risk of malignancy but it is not fully understood if this is due to the drug or the disease state itself. The patient was instructed to avoid use of live vaccinations without the approval of a physician. No recommendations for any changes at this time.  Benard Halsted, PharmD, [place], [place] [phone]

## 2020-01-12 MED FILL — KEVZARA 200 MG/1.14ML SOSY: 200 | 28 days supply | Qty: 2 | Fill #0

## 2020-01-19 MED FILL — predniSONE 1 MG TABS: 1 | 30 days supply | Qty: 120 | Fill #1

## 2020-01-20 ENCOUNTER — Other Ambulatory Visit (HOSPITAL_COMMUNITY): Payer: Self-pay

## 2020-01-20 MED FILL — IBUPROFEN 800 MG TABS: 800 | 30 days supply | Qty: 30 | Fill #1

## 2020-01-20 MED FILL — CHLORHEXIDINE 0.12% RINSE: 0.12 | 17 days supply | Qty: 473 | Fill #0

## 2020-01-25 MED FILL — CYCLOBENZAPRINE HCL 10 MG T: 10 | 30 days supply | Qty: 30 | Fill #1

## 2020-02-14 ENCOUNTER — Other Ambulatory Visit (HOSPITAL_COMMUNITY): Payer: Self-pay | Admitting: Physician Assistant

## 2020-02-14 MED FILL — predniSONE 5 MG TABS: 5 | 90 days supply | Qty: 90 | Fill #0

## 2020-02-16 MED FILL — KEVZARA 200 MG/1.14ML SOSY: 200 | 28 days supply | Qty: 2 | Fill #1

## 2020-03-02 ENCOUNTER — Other Ambulatory Visit (HOSPITAL_COMMUNITY): Payer: Self-pay | Admitting: Obstetrics and Gynecology

## 2020-03-02 DIAGNOSIS — Z6833 Body mass index (BMI) 33.0-33.9, adult: Secondary | ICD-10-CM | POA: Diagnosis not present

## 2020-03-02 DIAGNOSIS — Z13820 Encounter for screening for osteoporosis: Secondary | ICD-10-CM | POA: Diagnosis not present

## 2020-03-02 DIAGNOSIS — Z01419 Encounter for gynecological examination (general) (routine) without abnormal findings: Secondary | ICD-10-CM | POA: Diagnosis not present

## 2020-03-02 DIAGNOSIS — Z1231 Encounter for screening mammogram for malignant neoplasm of breast: Secondary | ICD-10-CM | POA: Diagnosis not present

## 2020-03-02 MED FILL — PANTOPRAZOLE SOD DR 40 MG T: 40 | 30 days supply | Qty: 60 | Fill #0

## 2020-03-02 MED FILL — HYDROCODON-APAP 5-325: 5-325 | 10 days supply | Qty: 20 | Fill #0

## 2020-03-06 DIAGNOSIS — M21612 Bunion of left foot: Secondary | ICD-10-CM | POA: Diagnosis not present

## 2020-03-06 DIAGNOSIS — S92912D Unspecified fracture of left toe(s), subsequent encounter for fracture with routine healing: Secondary | ICD-10-CM | POA: Diagnosis not present

## 2020-03-15 MED FILL — KEVZARA 200 MG/1.14ML SOSY: 200 | 28 days supply | Qty: 2 | Fill #2

## 2020-03-22 DIAGNOSIS — M79672 Pain in left foot: Secondary | ICD-10-CM | POA: Diagnosis not present

## 2020-03-23 ENCOUNTER — Other Ambulatory Visit (HOSPITAL_COMMUNITY): Payer: Self-pay | Admitting: Physician Assistant

## 2020-03-23 DIAGNOSIS — R7989 Other specified abnormal findings of blood chemistry: Secondary | ICD-10-CM | POA: Diagnosis not present

## 2020-03-23 DIAGNOSIS — G6289 Other specified polyneuropathies: Secondary | ICD-10-CM | POA: Diagnosis not present

## 2020-03-23 DIAGNOSIS — Z79899 Other long term (current) drug therapy: Secondary | ICD-10-CM | POA: Diagnosis not present

## 2020-03-23 DIAGNOSIS — M21612 Bunion of left foot: Secondary | ICD-10-CM | POA: Diagnosis not present

## 2020-03-23 DIAGNOSIS — M150 Primary generalized (osteo)arthritis: Secondary | ICD-10-CM | POA: Diagnosis not present

## 2020-03-23 DIAGNOSIS — M545 Low back pain, unspecified: Secondary | ICD-10-CM | POA: Diagnosis not present

## 2020-03-23 DIAGNOSIS — M0589 Other rheumatoid arthritis with rheumatoid factor of multiple sites: Secondary | ICD-10-CM | POA: Diagnosis not present

## 2020-03-23 DIAGNOSIS — R053 Chronic cough: Secondary | ICD-10-CM | POA: Diagnosis not present

## 2020-03-23 DIAGNOSIS — M2550 Pain in unspecified joint: Secondary | ICD-10-CM | POA: Diagnosis not present

## 2020-03-23 DIAGNOSIS — M8080XA Other osteoporosis with current pathological fracture, unspecified site, initial encounter for fracture: Secondary | ICD-10-CM | POA: Diagnosis not present

## 2020-03-23 MED FILL — CELECOXIB 200 MG CAP: 200 | 30 days supply | Qty: 30 | Fill #0

## 2020-04-06 DIAGNOSIS — H2513 Age-related nuclear cataract, bilateral: Secondary | ICD-10-CM | POA: Diagnosis not present

## 2020-04-06 DIAGNOSIS — H52223 Regular astigmatism, bilateral: Secondary | ICD-10-CM | POA: Diagnosis not present

## 2020-04-06 DIAGNOSIS — H5203 Hypermetropia, bilateral: Secondary | ICD-10-CM | POA: Diagnosis not present

## 2020-04-10 MED FILL — IBUPROFEN 800 MG TABS: 800 | 30 days supply | Qty: 90 | Fill #1

## 2020-04-13 ENCOUNTER — Other Ambulatory Visit (HOSPITAL_COMMUNITY): Payer: Self-pay

## 2020-04-18 ENCOUNTER — Other Ambulatory Visit (HOSPITAL_COMMUNITY): Payer: Self-pay

## 2020-04-18 DIAGNOSIS — G8918 Other acute postprocedural pain: Secondary | ICD-10-CM | POA: Diagnosis not present

## 2020-04-18 DIAGNOSIS — M21612 Bunion of left foot: Secondary | ICD-10-CM | POA: Diagnosis not present

## 2020-04-18 MED ORDER — ASPIRIN 81 MG PO TBEC
81.0000 mg | DELAYED_RELEASE_TABLET | Freq: Two times a day (BID) | ORAL | 0 refills | Status: DC
Start: 2020-04-18 — End: 2023-05-15
  Filled 2020-04-18: qty 90, 45d supply, fill #0

## 2020-04-18 MED ORDER — OXYCODONE HCL 5 MG PO TABS
5.0000 mg | ORAL_TABLET | ORAL | 0 refills | Status: DC
Start: 2020-04-18 — End: 2023-05-15
  Filled 2020-04-18: qty 18, 3d supply, fill #0

## 2020-04-26 ENCOUNTER — Other Ambulatory Visit (HOSPITAL_COMMUNITY): Payer: Self-pay

## 2020-04-26 MED FILL — Sarilumab Subcutaneous Soln Prefilled Syringe 200 MG/1.14ML: SUBCUTANEOUS | 28 days supply | Qty: 2.28 | Fill #0 | Status: AC

## 2020-04-28 ENCOUNTER — Other Ambulatory Visit (HOSPITAL_COMMUNITY): Payer: Self-pay

## 2020-04-28 DIAGNOSIS — R350 Frequency of micturition: Secondary | ICD-10-CM | POA: Diagnosis not present

## 2020-04-28 DIAGNOSIS — N3000 Acute cystitis without hematuria: Secondary | ICD-10-CM | POA: Diagnosis not present

## 2020-04-28 MED ORDER — PHENAZOPYRIDINE HCL 200 MG PO TABS
200.0000 mg | ORAL_TABLET | Freq: Three times a day (TID) | ORAL | 0 refills | Status: DC
  Filled 2020-04-28: qty 6, 2d supply, fill #0

## 2020-04-28 MED ORDER — NITROFURANTOIN MONOHYD MACRO 100 MG PO CAPS
100.0000 mg | ORAL_CAPSULE | Freq: Two times a day (BID) | ORAL | 0 refills | Status: DC
  Filled 2020-04-28: qty 14, 7d supply, fill #0

## 2020-05-03 DIAGNOSIS — Z4889 Encounter for other specified surgical aftercare: Secondary | ICD-10-CM | POA: Diagnosis not present

## 2020-05-03 DIAGNOSIS — M21612 Bunion of left foot: Secondary | ICD-10-CM | POA: Diagnosis not present

## 2020-05-04 ENCOUNTER — Other Ambulatory Visit (HOSPITAL_COMMUNITY): Payer: Self-pay

## 2020-05-05 ENCOUNTER — Other Ambulatory Visit (HOSPITAL_COMMUNITY): Payer: Self-pay

## 2020-05-05 MED ORDER — NITROFURANTOIN MONOHYD MACRO 100 MG PO CAPS
100.0000 mg | ORAL_CAPSULE | Freq: Two times a day (BID) | ORAL | 0 refills | Status: DC
  Filled 2020-05-05: qty 14, 7d supply, fill #0

## 2020-05-05 MED ORDER — SULFAMETHOXAZOLE-TRIMETHOPRIM 800-160 MG PO TABS
1.0000 | ORAL_TABLET | Freq: Two times a day (BID) | ORAL | 0 refills | Status: AC
  Filled 2020-05-05: qty 14, 7d supply, fill #0

## 2020-05-11 ENCOUNTER — Other Ambulatory Visit (HOSPITAL_COMMUNITY): Payer: Self-pay

## 2020-05-11 MED FILL — Folic Acid Tab 1 MG: ORAL | 90 days supply | Qty: 270 | Fill #0 | Status: AC

## 2020-05-11 MED FILL — Prednisone Tab 5 MG: ORAL | 90 days supply | Qty: 90 | Fill #0 | Status: AC

## 2020-05-12 ENCOUNTER — Other Ambulatory Visit (HOSPITAL_COMMUNITY): Payer: Self-pay

## 2020-05-12 DIAGNOSIS — N390 Urinary tract infection, site not specified: Secondary | ICD-10-CM | POA: Diagnosis not present

## 2020-05-12 MED ORDER — NITROFURANTOIN MONOHYD MACRO 100 MG PO CAPS
100.0000 mg | ORAL_CAPSULE | Freq: Two times a day (BID) | ORAL | 0 refills | Status: AC
  Filled 2020-05-12: qty 10, 5d supply, fill #0

## 2020-05-12 MED ORDER — PHENAZOPYRIDINE HCL 200 MG PO TABS
200.0000 mg | ORAL_TABLET | Freq: Three times a day (TID) | ORAL | 0 refills | Status: AC
  Filled 2020-05-12: qty 6, 2d supply, fill #0

## 2020-05-29 ENCOUNTER — Other Ambulatory Visit (HOSPITAL_COMMUNITY): Payer: Self-pay

## 2020-05-31 ENCOUNTER — Other Ambulatory Visit: Payer: Self-pay | Admitting: Pharmacist

## 2020-05-31 ENCOUNTER — Other Ambulatory Visit (HOSPITAL_COMMUNITY): Payer: Self-pay

## 2020-05-31 MED ORDER — KEVZARA 200 MG/1.14ML ~~LOC~~ SOSY
PREFILLED_SYRINGE | SUBCUTANEOUS | 3 refills | Status: DC
  Filled 2020-05-31: qty 2.28, 28d supply, fill #0
  Filled 2020-06-20: qty 2.28, 28d supply, fill #1
  Filled 2020-07-26: qty 2.28, 28d supply, fill #2
  Filled 2020-09-04: qty 2.28, 28d supply, fill #3

## 2020-05-31 MED ORDER — KEVZARA 200 MG/1.14ML ~~LOC~~ SOSY
PREFILLED_SYRINGE | SUBCUTANEOUS | 3 refills | Status: DC
Start: 2020-05-31 — End: 2020-05-31

## 2020-06-01 ENCOUNTER — Other Ambulatory Visit (HOSPITAL_COMMUNITY): Payer: Self-pay

## 2020-06-01 DIAGNOSIS — M21612 Bunion of left foot: Secondary | ICD-10-CM | POA: Diagnosis not present

## 2020-06-01 DIAGNOSIS — Z4789 Encounter for other orthopedic aftercare: Secondary | ICD-10-CM | POA: Diagnosis not present

## 2020-06-06 DIAGNOSIS — D2272 Melanocytic nevi of left lower limb, including hip: Secondary | ICD-10-CM | POA: Diagnosis not present

## 2020-06-06 DIAGNOSIS — D2261 Melanocytic nevi of right upper limb, including shoulder: Secondary | ICD-10-CM | POA: Diagnosis not present

## 2020-06-06 DIAGNOSIS — L814 Other melanin hyperpigmentation: Secondary | ICD-10-CM | POA: Diagnosis not present

## 2020-06-06 DIAGNOSIS — D1801 Hemangioma of skin and subcutaneous tissue: Secondary | ICD-10-CM | POA: Diagnosis not present

## 2020-06-06 DIAGNOSIS — L821 Other seborrheic keratosis: Secondary | ICD-10-CM | POA: Diagnosis not present

## 2020-06-06 DIAGNOSIS — D225 Melanocytic nevi of trunk: Secondary | ICD-10-CM | POA: Diagnosis not present

## 2020-06-06 DIAGNOSIS — L820 Inflamed seborrheic keratosis: Secondary | ICD-10-CM | POA: Diagnosis not present

## 2020-06-06 DIAGNOSIS — D2362 Other benign neoplasm of skin of left upper limb, including shoulder: Secondary | ICD-10-CM | POA: Diagnosis not present

## 2020-06-06 DIAGNOSIS — Z85828 Personal history of other malignant neoplasm of skin: Secondary | ICD-10-CM | POA: Diagnosis not present

## 2020-06-08 ENCOUNTER — Other Ambulatory Visit (HOSPITAL_COMMUNITY): Payer: Self-pay

## 2020-06-08 MED ORDER — CARESTART COVID-19 HOME TEST VI KIT
PACK | 0 refills | Status: AC
  Filled 2020-06-08: qty 4, 4d supply, fill #0

## 2020-06-08 MED FILL — Celecoxib Cap 200 MG: ORAL | 30 days supply | Qty: 30 | Fill #0 | Status: AC

## 2020-06-14 ENCOUNTER — Other Ambulatory Visit (HOSPITAL_COMMUNITY): Payer: Self-pay

## 2020-06-15 ENCOUNTER — Other Ambulatory Visit (HOSPITAL_COMMUNITY): Payer: Self-pay

## 2020-06-15 MED FILL — Denosumab Inj Soln Prefilled Syringe 60 MG/ML: SUBCUTANEOUS | 180 days supply | Qty: 1 | Fill #0 | Status: CN

## 2020-06-20 ENCOUNTER — Other Ambulatory Visit: Payer: Self-pay

## 2020-06-20 ENCOUNTER — Ambulatory Visit: Payer: 59 | Attending: Family Medicine | Admitting: Pharmacist

## 2020-06-20 ENCOUNTER — Other Ambulatory Visit (HOSPITAL_COMMUNITY): Payer: Self-pay

## 2020-06-20 DIAGNOSIS — Z79899 Other long term (current) drug therapy: Secondary | ICD-10-CM

## 2020-06-20 MED ORDER — DENOSUMAB 60 MG/ML ~~LOC~~ SOSY
PREFILLED_SYRINGE | SUBCUTANEOUS | 2 refills | Status: AC
  Filled 2020-06-20: qty 1, 180d supply, fill #0
  Filled 2020-12-06: qty 1, 180d supply, fill #1
  Filled 2021-06-20: qty 1, 180d supply, fill #2

## 2020-06-20 MED FILL — Denosumab Inj Soln Prefilled Syringe 60 MG/ML: SUBCUTANEOUS | 180 days supply | Qty: 1 | Fill #0 | Status: CN

## 2020-06-20 NOTE — Progress Notes (Signed)
S:  Patient presents for review of their specialty medication therapy.  Patient is currently taking Prolia for osteoporosis. Patient is managed by Dr. Chalmers Cater for this.   Adherence: confirms  Efficacy: does not note any differences. Has a yearly BMD.   Dosing: 60 mg daily every 6 months  Dose adjustments: Renal: Monitor patients with severe impairment (CrCl <30 mL/minute or on dialysis) closely, as significant and prolonged hypocalcemia (incidence of 29% and potentially lasting weeks to months) and marked elevations of serum parathyroid hormone are serious risks in this population. Ensure adequate calcium and vitamin D intake/supplementation. CrCl ?30 mL/minute: No dosage adjustment necessary. CrCl <30 mL/minute: No dosage adjustment necessary; use in conjunction with guidance from patient's nephrology team. Hepatic: no dose adjustments (has not been studied)  Drug-drug interactions: none identified   Screening: TB test: none   Hepatitis: none   Monitoring: S/sx of infection: none  S/sx of hypersensitivity: none  S/sx of hypocalcemia/hypercalcemia: none Dermatitis/skin rash: none Peripheral edema: none HA: none GI upset: none  Other side effects: none  Last bone density study: yearly    O:      Lab Results  Component Value Date   WBC 3.8 (L) 09/30/2016   HGB 14.0 09/30/2016   HCT 42.1 09/30/2016   MCV 102.3 (H) 09/30/2016   PLT 149.0 (L) 09/30/2016      Chemistry      Component Value Date/Time   NA 137 10/15/2019 1002   K 3.7 10/15/2019 1002   CL 104 10/15/2019 1002   CO2 26 10/15/2019 1002   BUN 17 10/15/2019 1002   CREATININE 0.79 10/15/2019 1002      Component Value Date/Time   CALCIUM 9.0 10/15/2019 1002     A/P: 1. Medication review: Patient currently on Prolia for osteoporosis. Reviewed the medication with the patient, including the following: Prolia (denosumab) is a monoclonal antibody with affinity for nuclear factor-kappa ligand (RANKL). Prolia  binds to RANKL and prevents osteoclast formation, leading to decreased bone resorption and increased bone mass in osteoporosis. Patient educated on purpose, proper use, and potential adverse effects of Prolia. The most common adverse effects are hypersensitivities, peripheral edema, dermatitis/skin rash, GI upset, HA, joint pain, and infection. There is the possibility of atypical femur fracture, serum calcium disturbances, and osteonecrosis of the jaw. Patients should monitor for and report hip, thigh, or groin pain. Additionally, patients should monitor for and report jaw pain, tooth/periodontal infection, toothache, and/or gingival ulceration/erosion. Prolia exists as a solution prefilled syringe for SQ administration. Administration: Denosumab is intended for SubQ route only and should not be administered IV, IM, or intradermally. Prior to administration, bring to room temperature in original container (allow to stand ~15 to 30 minutes); do not warm by any other method. Solution may contain trace amounts of translucent to white protein particles; do not use if cloudy, discolored (normal solution should be clear and colorless to pale yellow), or contains excessive particles or foreign matter. Avoid vigorous shaking. Administer via SubQ injection in the upper arm, upper thigh, or abdomen; should only be administered by a health care professional. No recommendations for any changes at this time.  Benard Halsted, PharmD, [facility] [place], [place] [phone]

## 2020-06-23 ENCOUNTER — Other Ambulatory Visit: Payer: Self-pay | Admitting: Pharmacist

## 2020-06-23 ENCOUNTER — Other Ambulatory Visit (HOSPITAL_COMMUNITY): Payer: Self-pay

## 2020-06-23 DIAGNOSIS — M8080XA Other osteoporosis with current pathological fracture, unspecified site, initial encounter for fracture: Secondary | ICD-10-CM | POA: Diagnosis not present

## 2020-06-23 DIAGNOSIS — R7989 Other specified abnormal findings of blood chemistry: Secondary | ICD-10-CM | POA: Diagnosis not present

## 2020-06-23 DIAGNOSIS — R053 Chronic cough: Secondary | ICD-10-CM | POA: Diagnosis not present

## 2020-06-23 DIAGNOSIS — M545 Low back pain, unspecified: Secondary | ICD-10-CM | POA: Diagnosis not present

## 2020-06-23 DIAGNOSIS — M0589 Other rheumatoid arthritis with rheumatoid factor of multiple sites: Secondary | ICD-10-CM | POA: Diagnosis not present

## 2020-06-23 DIAGNOSIS — G6289 Other specified polyneuropathies: Secondary | ICD-10-CM | POA: Diagnosis not present

## 2020-06-23 DIAGNOSIS — Z79899 Other long term (current) drug therapy: Secondary | ICD-10-CM | POA: Diagnosis not present

## 2020-06-23 DIAGNOSIS — M150 Primary generalized (osteo)arthritis: Secondary | ICD-10-CM | POA: Diagnosis not present

## 2020-06-23 DIAGNOSIS — R5383 Other fatigue: Secondary | ICD-10-CM | POA: Diagnosis not present

## 2020-06-23 DIAGNOSIS — R252 Cramp and spasm: Secondary | ICD-10-CM | POA: Diagnosis not present

## 2020-06-23 MED ORDER — KEVZARA 200 MG/1.14ML ~~LOC~~ SOSY
PREFILLED_SYRINGE | SUBCUTANEOUS | 5 refills | Status: DC
  Filled 2020-06-23 – 2020-09-28 (×2): qty 2.28, 28d supply, fill #0
  Filled 2020-11-01 – 2020-11-13 (×3): qty 2.28, 28d supply, fill #1
  Filled 2020-12-06: qty 2.28, 28d supply, fill #2
  Filled 2021-01-04: qty 2.28, 28d supply, fill #3
  Filled 2021-01-31: qty 2.28, 28d supply, fill #4
  Filled 2021-03-01: qty 2.28, 28d supply, fill #5

## 2020-06-23 MED ORDER — PREDNISONE 5 MG PO TABS
5.0000 mg | ORAL_TABLET | Freq: Every day | ORAL | 1 refills | Status: DC
Start: 2020-06-23 — End: 2021-01-12
  Filled 2020-06-23 – 2020-08-07 (×2): qty 30, 30d supply, fill #0
  Filled 2020-09-04: qty 30, 30d supply, fill #1

## 2020-06-23 MED ORDER — KEVZARA 200 MG/1.14ML ~~LOC~~ SOSY
PREFILLED_SYRINGE | SUBCUTANEOUS | 5 refills | Status: DC
Start: 2020-06-23 — End: 2020-06-23

## 2020-06-23 MED ORDER — CELECOXIB 200 MG PO CAPS
200.0000 mg | ORAL_CAPSULE | Freq: Every day | ORAL | 2 refills | Status: DC
Start: 2020-06-23 — End: 2023-05-15
  Filled 2020-06-23 – 2020-09-06 (×2): qty 30, 30d supply, fill #0
  Filled 2020-10-12: qty 30, 30d supply, fill #1
  Filled 2020-11-07: qty 30, 30d supply, fill #2

## 2020-06-28 DIAGNOSIS — M810 Age-related osteoporosis without current pathological fracture: Secondary | ICD-10-CM | POA: Diagnosis not present

## 2020-06-29 ENCOUNTER — Other Ambulatory Visit (HOSPITAL_COMMUNITY): Payer: Self-pay

## 2020-07-03 DIAGNOSIS — M79672 Pain in left foot: Secondary | ICD-10-CM | POA: Diagnosis not present

## 2020-07-03 DIAGNOSIS — Z4789 Encounter for other orthopedic aftercare: Secondary | ICD-10-CM | POA: Diagnosis not present

## 2020-07-07 ENCOUNTER — Other Ambulatory Visit (HOSPITAL_COMMUNITY): Payer: Self-pay

## 2020-07-10 ENCOUNTER — Other Ambulatory Visit: Payer: Self-pay

## 2020-07-10 ENCOUNTER — Emergency Department (HOSPITAL_BASED_OUTPATIENT_CLINIC_OR_DEPARTMENT_OTHER): Payer: 59

## 2020-07-10 ENCOUNTER — Emergency Department (HOSPITAL_BASED_OUTPATIENT_CLINIC_OR_DEPARTMENT_OTHER)
Admission: EM | Admit: 2020-07-10 | Discharge: 2020-07-10 | Disposition: A | Discharge status: Home / Self Care | Payer: 59 | Attending: Emergency Medicine | Admitting: Emergency Medicine

## 2020-07-10 ENCOUNTER — Encounter (HOSPITAL_BASED_OUTPATIENT_CLINIC_OR_DEPARTMENT_OTHER): Payer: Self-pay | Admitting: Emergency Medicine

## 2020-07-10 DIAGNOSIS — R030 Elevated blood-pressure reading, without diagnosis of hypertension: Secondary | ICD-10-CM | POA: Diagnosis not present

## 2020-07-10 DIAGNOSIS — M7989 Other specified soft tissue disorders: Secondary | ICD-10-CM | POA: Diagnosis not present

## 2020-07-10 DIAGNOSIS — K5792 Diverticulitis of intestine, part unspecified, without perforation or abscess without bleeding: Secondary | ICD-10-CM | POA: Insufficient documentation

## 2020-07-10 DIAGNOSIS — Z7982 Long term (current) use of aspirin: Secondary | ICD-10-CM | POA: Diagnosis not present

## 2020-07-10 DIAGNOSIS — Z79899 Other long term (current) drug therapy: Secondary | ICD-10-CM | POA: Diagnosis not present

## 2020-07-10 DIAGNOSIS — R1032 Left lower quadrant pain: Secondary | ICD-10-CM | POA: Diagnosis not present

## 2020-07-10 DIAGNOSIS — R102 Pelvic and perineal pain: Secondary | ICD-10-CM | POA: Diagnosis present

## 2020-07-10 DIAGNOSIS — R109 Unspecified abdominal pain: Secondary | ICD-10-CM | POA: Diagnosis not present

## 2020-07-10 DIAGNOSIS — E039 Hypothyroidism, unspecified: Secondary | ICD-10-CM | POA: Insufficient documentation

## 2020-07-10 DIAGNOSIS — N390 Urinary tract infection, site not specified: Secondary | ICD-10-CM | POA: Diagnosis not present

## 2020-07-10 LAB — COMPREHENSIVE METABOLIC PANEL
ALT: 23 U/L (ref 0–44)
AST: 21 U/L (ref 15–41)
Albumin: 4.3 g/dL (ref 3.5–5.0)
Alkaline Phosphatase: 52 U/L (ref 38–126)
Anion gap: 10 (ref 5–15)
BUN: 16 mg/dL (ref 8–23)
CO2: 24 mmol/L (ref 22–32)
Calcium: 9.4 mg/dL (ref 8.9–10.3)
Chloride: 106 mmol/L (ref 98–111)
Creatinine, Ser: 0.7 mg/dL (ref 0.44–1.00)
GFR, Estimated: >60 mL/min (ref >60)
Glucose, Bld: 98 mg/dL (ref 70–99)
Potassium: 3.7 mmol/L (ref 3.5–5.1)
Sodium: 140 mmol/L (ref 135–145)
Total Bilirubin: 0.9 mg/dL (ref 0.3–1.2)
Total Protein: 6.2 g/dL — ABNORMAL LOW (ref 6.5–8.1)

## 2020-07-10 LAB — URINALYSIS, ROUTINE W REFLEX MICROSCOPIC
Bilirubin Urine: NEGATIVE
Glucose, UA: NEGATIVE mg/dL
Hgb urine dipstick: NEGATIVE
Ketones, ur: NEGATIVE mg/dL
Leukocytes,Ua: NEGATIVE
Nitrite: NEGATIVE
Protein, ur: NEGATIVE mg/dL
Specific Gravity, Urine: 1.006 (ref 1.005–1.030)
pH: 5.5 (ref 5.0–8.0)

## 2020-07-10 LAB — CBC WITH DIFFERENTIAL/PLATELET
Abs Immature Granulocytes: 0.02 10*3/uL (ref 0.00–0.07)
Basophils Absolute: 0 10*3/uL (ref 0.0–0.1)
Basophils Relative: 0 %
Eosinophils Absolute: 0.2 10*3/uL (ref 0.0–0.5)
Eosinophils Relative: 2 %
HCT: 47 % — ABNORMAL HIGH (ref 36.0–46.0)
Hemoglobin: 15.9 g/dL — ABNORMAL HIGH (ref 12.0–15.0)
Immature Granulocytes: 0 %
Lymphocytes Relative: 21 %
Lymphs Abs: 2 10*3/uL (ref 0.7–4.0)
MCH: 32.1 pg (ref 26.0–34.0)
MCHC: 33.8 g/dL (ref 30.0–36.0)
MCV: 94.9 fL (ref 80.0–100.0)
Monocytes Absolute: 0.8 10*3/uL (ref 0.1–1.0)
Monocytes Relative: 9 %
Neutro Abs: 6.3 10*3/uL (ref 1.7–7.7)
Neutrophils Relative %: 68 %
Platelets: 153 10*3/uL (ref 150–400)
RBC: 4.95 MIL/uL (ref 3.87–5.11)
RDW: 12.2 % (ref 11.5–15.5)
WBC: 9.4 10*3/uL (ref 4.0–10.5)
nRBC: 0 % (ref 0.0–0.2)

## 2020-07-10 MED ORDER — AMOXICILLIN-POT CLAVULANATE 875-125 MG PO TABS
1.0000 | ORAL_TABLET | Freq: Two times a day (BID) | ORAL | 0 refills | Status: AC
  Filled 2020-07-10: qty 20, 10d supply, fill #0

## 2020-07-10 MED ORDER — AMOXICILLIN-POT CLAVULANATE 875-125 MG PO TABS
1.0000 | ORAL_TABLET | Freq: Once | ORAL | Status: AC
  Administered 2020-07-10: 1 via ORAL
  Filled 2020-07-10: qty 1

## 2020-07-10 MED ORDER — KETOROLAC TROMETHAMINE 15 MG/ML IJ SOLN
15.0000 mg | Freq: Once | INTRAMUSCULAR | Status: AC
  Administered 2020-07-10: 21:00:00 15 mg via INTRAVENOUS
  Filled 2020-07-10: qty 1

## 2020-07-10 MED ORDER — IOHEXOL 300 MG/ML  SOLN
100.0000 mL | Freq: Once | INTRAMUSCULAR | Status: AC | PRN
  Administered 2020-07-10: 22:00:00 100 mL via INTRAVENOUS

## 2020-07-10 NOTE — ED Provider Notes (Signed)
[facility] EMERGENCY DEPT Provider Note   CSN: [id number] Arrival date & time: 07/10/20  1818     History Chief Complaint  Patient presents with   Pelvic Pain   Melena    Olivia Rubio is a 65 y.o. female.  Patient presents with lower abdominal pain and mucousy stools for 1 day.  Describes the pain as sharp and aching in the mid lower region.  Denies any radiation of pain.  No reports of fevers or cough or vomiting or diarrhea.  She had loose stools that is mucousy substances and some blood after she had a bowel movement.  She denies of stool being bloody but noted some blood around the stool.        Past Medical History:  Diagnosis Date   Acid reflux    Arthritis    Baker's cyst of knee    Change in voice    Cough    Endometriosis    H/O: hysterectomy    Hypothyroidism    Nasal congestion    Osteoporosis    Thyroid nodule     Patient Active Problem List   Diagnosis Date Noted   DOE (dyspnea on exertion) 09/30/2016   Upper airway cough syndrome 08/29/2016   Ankle fracture, right, closed, initial encounter 07/16/2016   Multinodular goiter (nontoxic), dominant left thyroid nodule 11/12/2010   SINUSITIS 07/09/2006    Past Surgical History:  Procedure Laterality Date   ABDOMINAL HYSTERECTOMY  1988   SINUS SURGERY WITH INSTATRAK  2008     OB History   No obstetric history on file.     Family History  Problem Relation Age of Onset   Diabetes Father    Breast cancer Mother    Cancer Mother        breast   Cancer Brother        lymphoma   Cancer Paternal Grandmother        breast   Cancer Maternal Uncle        colon   Thyroid disease Sister    Arthritis Sister    Cancer Maternal Aunt        breast    Social History   Tobacco Use   Smoking status: Never   Smokeless tobacco: Never  Vaping Use   Vaping Use: Never used  Substance Use Topics   Alcohol use: No   Drug use: No    Home Medications Prior to Admission medications    Medication Sig Start Date End Date Taking? Authorizing Provider  amoxicillin-clavulanate (AUGMENTIN) 875-125 MG tablet Take 1 tablet by mouth every 12 (twelve) hours for 10 days. 07/10/20 07/20/20 Yes Luna Fuse, MD  aspirin 81 MG EC tablet Take 1 tablet (81 mg total) by mouth 2 (two) times daily. 04/18/20     calcitonin, salmon, (MIACALCIN/FORTICAL) 200 UNIT/ACT nasal spray 1 spray daily. 08/17/19   [provider]  celecoxib (CELEBREX) 200 MG capsule TAKE 1 CAPSULE BY MOUTH ONCE A DAY WITH FOOD 03/23/20 03/23/21  Rosita Kea, PA-C  celecoxib (CELEBREX) 200 MG capsule Take 1 capsule (200 mg total) by mouth daily with food 06/23/20     chlorhexidine (PERIDEX) 0.12 % solution RINSE MOUTH WITH 1/2 OUNCE 2 TIMES DAILY 01/20/20 01/19/21    COVID-19 At Home Antigen Test (CARESTART COVID-19 HOME TEST) KIT use as directed 06/08/20   Jefm Bryant, RPH  cyclobenzaprine (FLEXERIL) 10 MG tablet Take 10 mg by mouth 3 (three) times daily as needed.  [provider]  denosumab (PROLIA) 60 MG/ML SOSY injection INJECT 1 ML EVERY 6 MONTHS 06/20/20 06/20/21  Tresa Garter, MD  folic acid (FOLVITE) 1 MG tablet Take 1 mg by mouth 3 (three) times daily.     [provider]  folic acid (FOLVITE) 1 MG tablet TAKE 3 TABLETS BY MOUTH ONCE DAILY. 09/22/19 09/21/20  Rosita Kea, PA-C  hydrochlorothiazide (MICROZIDE) 12.5 MG capsule TAKE 1 CAPSULE (12.5 MG TOTAL) BY MOUTH DAILY. 10/08/19 10/07/20  Kate Sable, MD  HYDROcodone-acetaminophen (NORCO/VICODIN) 5-325 MG tablet TAKE 1 TABLET BY MOUTH 2 TIMES A DAY AS NEEDED FOR PAIN FOR 5 DAYS. 03/02/20 08/29/20  Dian Queen, MD  ibuprofen (ADVIL) 800 MG tablet Take 800 mg by mouth every 6 (six) hours as needed.  06/15/19   [provider]  metoprolol tartrate (LOPRESSOR) 100 MG tablet Take 1 tablet (100 mg total) by mouth once for 1 dose. Take 2 hours prior to your CT scan. 10/08/19 10/08/19  Kate Sable, MD  oxyCODONE (OXY IR/ROXICODONE)  5 MG immediate release tablet Take 1 tablet (5 mg total) by mouth every 4 (four) hours as needed for 3 days 04/18/20     pantoprazole (PROTONIX) 40 MG tablet Take 40 mg by mouth daily.      [provider]  pantoprazole (PROTONIX) 40 MG tablet TAKE 1 TABLET BY MOUTH TWICE DAILY 03/02/20 03/02/21  Dian Queen, MD  predniSONE (DELTASONE) 1 MG tablet TAKE 4 TABLETS BY MOUTH WITH FOOD OR MILK ONCE A DAY 12/24/19 12/23/20  Rosita Kea, PA-C  predniSONE (DELTASONE) 5 MG tablet Take 5 mg by mouth daily with breakfast.     [provider]  predniSONE (DELTASONE) 5 MG tablet TAKE 1 TABLET BY MOUTH DAILY 10/21/19 10/20/20  Rosita Kea, PA-C  predniSONE (DELTASONE) 5 MG tablet TAKE ALL 6 TABLETS BY MOUTH ON DAY 1, THEN DECREASE BY ONE TABLET EACH DAY (6-5-4-3-2-1) - TAKE ALL DOSES WITH FOOD 10/13/19 10/12/20  Susa Day, MD  predniSONE (DELTASONE) 5 MG tablet Take 1 tablet (5 mg total) by mouth daily. 06/23/20     sarilumab (KEVZARA) 200 MG/1.14ML SOSY INJECT 1 SYRINGE INTO THE SKIN EVERY 14 (FOURTEEN) DAYS. 05/31/20   Tresa Garter, MD  sarilumab (KEVZARA) 200 MG/1.14ML SOSY Inject 1.14 ml subcutaneously bi-weekly 06/23/20   Tresa Garter, MD  spironolactone (ALDACTONE) 25 MG tablet TAKE 2 TABLETS (50 MG TOTAL) BY MOUTH DAILY. 10/08/19 10/07/20  Kate Sable, MD  Vitamin D, Ergocalciferol, (DRISDOL) 1.25 MG (50000 UNIT) CAPS capsule Take 50,000 Units by mouth once a week. 09/21/19   [provider]    Allergies    Moxifloxacin  Review of Systems   Review of Systems  Constitutional:  Negative for fever.  HENT:  Negative for ear pain.   Eyes:  Negative for pain.  Respiratory:  Negative for cough.   Cardiovascular:  Negative for chest pain.  Gastrointestinal:  Positive for abdominal pain.  Genitourinary:  Negative for flank pain.  Musculoskeletal:  Negative for back pain.  Skin:  Negative for rash.  Neurological:  Negative for headaches.   Physical  Exam Updated Vital Signs BP (!) 154/83 (BP Location: Right Arm)   Pulse 62   Temp 98.1 F (36.7 C)   Resp 18   Ht '5\' 8"'  (1.727 m)   Wt 104.3 kg   SpO2 99%   BMI 34.97 kg/m   Physical Exam Constitutional:      General: She is not in acute  distress.    Appearance: Normal appearance.  HENT:     Head: Normocephalic.     Nose: Nose normal.  Eyes:     Extraocular Movements: Extraocular movements intact.  Cardiovascular:     Rate and Rhythm: Normal rate.  Pulmonary:     Effort: Pulmonary effort is normal.  Abdominal:     Comments: There is palpation in the mid to mid left lower abdominal region.  Musculoskeletal:        General: Normal range of motion.     Cervical back: Normal range of motion.  Neurological:     General: No focal deficit present.     Mental Status: She is alert. Mental status is at baseline.    ED Results / Procedures / Treatments   Labs (all labs ordered are listed, but only abnormal results are displayed) Labs Reviewed  CBC WITH DIFFERENTIAL/PLATELET - Abnormal; Notable for the following components:      Result Value   Hemoglobin 15.9 (*)    HCT 47.0 (*)    All other components within normal limits  COMPREHENSIVE METABOLIC PANEL - Abnormal; Notable for the following components:   Total Protein 6.2 (*)    All other components within normal limits  URINALYSIS, ROUTINE W REFLEX MICROSCOPIC - Abnormal; Notable for the following components:   Color, Urine COLORLESS (*)    All other components within normal limits    EKG None  Radiology CT Abdomen Pelvis W Contrast  Result Date: 07/10/2020 CLINICAL DATA:  Lower abdominal pain. EXAM: CT ABDOMEN AND PELVIS WITH CONTRAST TECHNIQUE: Multidetector CT imaging of the abdomen and pelvis was performed using the standard protocol following bolus administration of intravenous contrast. CONTRAST:  153m OMNIPAQUE IOHEXOL 300 MG/ML  SOLN COMPARISON:  February 22, 2019 FINDINGS: Lower chest: No acute abnormality.  Hepatobiliary: There is mild, diffuse fatty infiltration of the liver parenchyma. A 1.5 cm x 1.0 cm cystic appearing areas seen within the left lobe of the liver. A similar appearing 2.0 cm x 1.8 cm focus of low attenuation is noted within the right lobe. No gallstones, gallbladder wall thickening, or biliary dilatation. Pancreas: Unremarkable. No pancreatic ductal dilatation or surrounding inflammatory changes. Spleen: Normal in size without focal abnormality. Adrenals/Urinary Tract: Adrenal glands are unremarkable. Kidneys are normal in size, without renal calculi or hydronephrosis. Small parapelvic renal cysts are seen on the left. A 1.2 cm x 0.6 cm cyst is also noted within the anterior aspect of the mid left kidney. Bladder is unremarkable. Stomach/Bowel: There is a small hiatal hernia. Appendix appears normal. No evidence of bowel dilatation. Markedly inflamed diverticula are seen within the mid and distal sigmoid colon. There is no evidence of associated perforation or abscess. Vascular/Lymphatic: No significant vascular findings are present. No enlarged abdominal or pelvic lymph nodes. Reproductive: Uterus and bilateral adnexa are unremarkable. Other: No abdominal wall hernia or abnormality. No abdominopelvic ascites. Musculoskeletal: A chronic appearing fracture deformity is seen involving the superior endplate of the L1 vertebral body. IMPRESSION: 1. Marked severity sigmoid diverticulitis. 2. Small hiatal hernia. 3. Stable hepatic cysts. Electronically Signed   By: TVirgina NorfolkM.D.   On: 07/10/2020 22:00    Procedures Procedures   Medications Ordered in ED Medications  amoxicillin-clavulanate (AUGMENTIN) 875-125 MG per tablet 1 tablet (has no administration in time range)  ketorolac (TORADOL) 15 MG/ML injection 15 mg (15 mg Intravenous Given 07/10/20 2039)  iohexol (OMNIPAQUE) 300 MG/ML solution 100 mL (100 mLs Intravenous Contrast Given 07/10/20 2142)  ED Course  I have reviewed the  triage vital signs and the nursing notes.  Pertinent labs & imaging results that were available during my care of the patient were reviewed by me and considered in my medical decision making (see chart for details).    MDM Rules/Calculators/A&P                          Labs show normal white count normal chemistry urinalysis unremarkable.  CT abdomen pelvis concerning for acute diverticulitis with severely marked inflammatory changes.  No other secondary findings such as perforation or abscess noted per radiologist.  Had a lengthy discussion with the patient regarding the extensiveness of her diverticulitis.  However she states that her pain appears manageable at this time, declines narcotic medications, prefers to trial outpatient follow-up and outpatient management.  I did recommend immediate return for worsening symptoms otherwise will trial antibiotics for home.   Final Clinical Impression(s) / ED Diagnoses Final diagnoses:  Diverticulitis    Rx / DC Orders ED Discharge Orders          Ordered    amoxicillin-clavulanate (AUGMENTIN) 875-125 MG tablet  Every 12 hours        07/10/20 2227             Luna Fuse, MD 07/10/20 2228

## 2020-07-10 NOTE — ED Triage Notes (Signed)
Pt arrives to ED with c/o of lower abdominal and rectal pressure that started this morning. Pt reports x3 mucous/bloody stools today. Denies dysuria, fevers, chills.

## 2020-07-10 NOTE — Discharge Instructions (Addendum)
Take the antibiotics as written.  Recommend a clear liquid diet for the next 2 days with gradual advancement to semisolid and then to solid food.  Follow-up with your GI doctor in 2 or 3 days.  Return immediately to the ER if you have fevers worsening pain or any additional concerns.

## 2020-07-11 ENCOUNTER — Other Ambulatory Visit (HOSPITAL_COMMUNITY): Payer: Self-pay

## 2020-07-13 ENCOUNTER — Other Ambulatory Visit (HOSPITAL_COMMUNITY): Payer: Self-pay

## 2020-07-18 ENCOUNTER — Other Ambulatory Visit (HOSPITAL_COMMUNITY): Payer: Self-pay

## 2020-07-18 MED ORDER — NITROFURANTOIN MONOHYD MACRO 100 MG PO CAPS
100.0000 mg | ORAL_CAPSULE | Freq: Two times a day (BID) | ORAL | 0 refills | Status: AC
  Filled 2020-07-18: qty 10, 5d supply, fill #0

## 2020-07-21 ENCOUNTER — Other Ambulatory Visit (HOSPITAL_COMMUNITY): Payer: Self-pay

## 2020-07-21 MED FILL — Celecoxib Cap 200 MG: ORAL | 30 days supply | Qty: 30 | Fill #1 | Status: AC

## 2020-07-24 ENCOUNTER — Other Ambulatory Visit (HOSPITAL_COMMUNITY): Payer: Self-pay

## 2020-07-24 DIAGNOSIS — K5792 Diverticulitis of intestine, part unspecified, without perforation or abscess without bleeding: Secondary | ICD-10-CM | POA: Diagnosis not present

## 2020-07-24 DIAGNOSIS — Z8619 Personal history of other infectious and parasitic diseases: Secondary | ICD-10-CM | POA: Diagnosis not present

## 2020-07-24 MED ORDER — DICYCLOMINE HCL 10 MG PO CAPS
20.0000 mg | ORAL_CAPSULE | Freq: Three times a day (TID) | ORAL | 0 refills | Status: DC
  Filled 2020-07-24: qty 180, 30d supply, fill #0

## 2020-07-24 MED ORDER — SULFAMETHOXAZOLE-TRIMETHOPRIM 800-160 MG PO TABS
1.0000 | ORAL_TABLET | Freq: Two times a day (BID) | ORAL | 0 refills | Status: DC
  Filled 2020-07-24: qty 20, 10d supply, fill #0

## 2020-07-24 MED ORDER — METRONIDAZOLE 500 MG PO TABS
500.0000 mg | ORAL_TABLET | Freq: Three times a day (TID) | ORAL | 0 refills | Status: DC
  Filled 2020-07-24: qty 42, 14d supply, fill #0

## 2020-07-26 ENCOUNTER — Other Ambulatory Visit (HOSPITAL_COMMUNITY): Payer: Self-pay

## 2020-07-27 ENCOUNTER — Other Ambulatory Visit (HOSPITAL_COMMUNITY): Payer: Self-pay

## 2020-07-28 ENCOUNTER — Other Ambulatory Visit (HOSPITAL_COMMUNITY): Payer: Self-pay

## 2020-07-28 MED FILL — Pantoprazole Sodium EC Tab 40 MG (Base Equiv): ORAL | 30 days supply | Qty: 60 | Fill #0 | Status: AC

## 2020-07-30 ENCOUNTER — Other Ambulatory Visit: Payer: Self-pay

## 2020-07-30 ENCOUNTER — Emergency Department (HOSPITAL_BASED_OUTPATIENT_CLINIC_OR_DEPARTMENT_OTHER): Payer: 59

## 2020-07-30 ENCOUNTER — Encounter (HOSPITAL_BASED_OUTPATIENT_CLINIC_OR_DEPARTMENT_OTHER): Payer: Self-pay

## 2020-07-30 ENCOUNTER — Emergency Department (HOSPITAL_BASED_OUTPATIENT_CLINIC_OR_DEPARTMENT_OTHER)
Admission: EM | Admit: 2020-07-30 | Discharge: 2020-07-30 | Disposition: A | Discharge status: Home / Self Care | Payer: 59 | Attending: Emergency Medicine | Admitting: Emergency Medicine

## 2020-07-30 DIAGNOSIS — R1030 Lower abdominal pain, unspecified: Secondary | ICD-10-CM | POA: Insufficient documentation

## 2020-07-30 DIAGNOSIS — Z7982 Long term (current) use of aspirin: Secondary | ICD-10-CM | POA: Insufficient documentation

## 2020-07-30 DIAGNOSIS — K6289 Other specified diseases of anus and rectum: Secondary | ICD-10-CM | POA: Insufficient documentation

## 2020-07-30 DIAGNOSIS — R110 Nausea: Secondary | ICD-10-CM | POA: Insufficient documentation

## 2020-07-30 DIAGNOSIS — R1032 Left lower quadrant pain: Secondary | ICD-10-CM | POA: Insufficient documentation

## 2020-07-30 DIAGNOSIS — E039 Hypothyroidism, unspecified: Secondary | ICD-10-CM | POA: Insufficient documentation

## 2020-07-30 DIAGNOSIS — K5732 Diverticulitis of large intestine without perforation or abscess without bleeding: Secondary | ICD-10-CM | POA: Diagnosis not present

## 2020-07-30 LAB — COMPREHENSIVE METABOLIC PANEL
ALT: 30 U/L (ref 0–44)
AST: 27 U/L (ref 15–41)
Albumin: 4.3 g/dL (ref 3.5–5.0)
Alkaline Phosphatase: 34 U/L — ABNORMAL LOW (ref 38–126)
Anion gap: 8 (ref 5–15)
BUN: 12 mg/dL (ref 8–23)
CO2: 25 mmol/L (ref 22–32)
Calcium: 8.9 mg/dL (ref 8.9–10.3)
Chloride: 105 mmol/L (ref 98–111)
Creatinine, Ser: 0.91 mg/dL (ref 0.44–1.00)
GFR, Estimated: >60 mL/min (ref >60)
Glucose, Bld: 84 mg/dL (ref 70–99)
Potassium: 3.5 mmol/L (ref 3.5–5.1)
Sodium: 138 mmol/L (ref 135–145)
Total Bilirubin: 0.9 mg/dL (ref 0.3–1.2)
Total Protein: 6.3 g/dL — ABNORMAL LOW (ref 6.5–8.1)

## 2020-07-30 LAB — CBC WITH DIFFERENTIAL/PLATELET
Abs Immature Granulocytes: 0 10*3/uL (ref 0.00–0.07)
Basophils Absolute: 0.1 10*3/uL (ref 0.0–0.1)
Basophils Relative: 2 %
Eosinophils Absolute: 0.2 10*3/uL (ref 0.0–0.5)
Eosinophils Relative: 4 %
HCT: 44.5 % (ref 36.0–46.0)
Hemoglobin: 15 g/dL (ref 12.0–15.0)
Immature Granulocytes: 0 %
Lymphocytes Relative: 30 %
Lymphs Abs: 1.3 10*3/uL (ref 0.7–4.0)
MCH: 31.9 pg (ref 26.0–34.0)
MCHC: 33.7 g/dL (ref 30.0–36.0)
MCV: 94.7 fL (ref 80.0–100.0)
Monocytes Absolute: 0.6 10*3/uL (ref 0.1–1.0)
Monocytes Relative: 15 %
Neutro Abs: 2.1 10*3/uL (ref 1.7–7.7)
Neutrophils Relative %: 49 %
Platelets: 169 10*3/uL (ref 150–400)
RBC: 4.7 MIL/uL (ref 3.87–5.11)
RDW: 12.4 % (ref 11.5–15.5)
WBC: 4.3 10*3/uL (ref 4.0–10.5)
nRBC: 0 % (ref 0.0–0.2)

## 2020-07-30 LAB — URINALYSIS, ROUTINE W REFLEX MICROSCOPIC
Bilirubin Urine: NEGATIVE
Glucose, UA: NEGATIVE mg/dL
Hgb urine dipstick: NEGATIVE
Ketones, ur: NEGATIVE mg/dL
Leukocytes,Ua: NEGATIVE
Nitrite: NEGATIVE
Protein, ur: NEGATIVE mg/dL
Specific Gravity, Urine: <1.005 — ABNORMAL LOW (ref 1.005–1.030)
pH: 5.5 (ref 5.0–8.0)

## 2020-07-30 LAB — LIPASE, BLOOD: Lipase: 12 U/L (ref 11–51)

## 2020-07-30 MED ORDER — ONDANSETRON HCL 4 MG/2ML IJ SOLN
4.0000 mg | Freq: Once | INTRAMUSCULAR | Status: DC
  Filled 2020-07-30: qty 2

## 2020-07-30 MED ORDER — IOHEXOL 300 MG/ML  SOLN
100.0000 mL | Freq: Once | INTRAMUSCULAR | Status: AC | PRN
  Administered 2020-07-30: 100 mL via INTRAVENOUS

## 2020-07-30 MED ORDER — MORPHINE SULFATE (PF) 4 MG/ML IV SOLN
4.0000 mg | Freq: Once | INTRAVENOUS | Status: DC
  Filled 2020-07-30: qty 1

## 2020-07-30 MED ORDER — IOHEXOL 9 MG/ML PO SOLN
500.0000 mL | ORAL | Status: AC
  Administered 2020-07-30 (×2): 500 mL via ORAL

## 2020-07-30 MED ORDER — SODIUM CHLORIDE 0.9 % IV BOLUS
1000.0000 mL | Freq: Once | INTRAVENOUS | Status: AC
  Administered 2020-07-30: 1000 mL via INTRAVENOUS

## 2020-07-30 NOTE — ED Triage Notes (Signed)
She states she was seen here recently for abd. Discomfort and was dx with "diverticulitis". She saw her gastroenterologist this past Mon. (6 days ago); who placed her on [id number], Flagyl and Bentyl. She is here today with persistent, profound "pressure--it doesn't feel like 'pain'" at lower abd./bladder area. She is ambulatory and in no distress.

## 2020-07-30 NOTE — ED Provider Notes (Signed)
[facility] EMERGENCY DEPT Provider Note   CSN: [id number] Arrival date & time: 07/30/20  1251     History Chief Complaint  Patient presents with   Abdominal Pain    Olivia Rubio is a 65 y.o. female.  She is complaining of lower abdominal pain and rectal discomfort that is been going on for over 2 weeks.  She was here on June 27 and had a CT consistent with sigmoid diverticulitis.  She was treated by antibiotics and she said it improved but started recurring as soon as she stopped the antibiotics.  She saw her GI doctor at [facility] who put her on Bactrim and Flagyl, Bentyl.  She had some improvement for a few days and then it started worsening again.  Continues with her discomfort.  No fever.  Feels nauseous no vomiting.  No diarrhea or constipation.  No rectal bleeding.  May be urinating more frequently.  The history is provided by the patient.  Abdominal Pain Pain location:  Suprapubic and LLQ Pain quality: aching   Pain severity:  Moderate Onset quality:  Gradual Duration:  2 weeks Timing:  Intermittent Progression:  Worsening Chronicity:  New Context: not recent travel, not sick contacts and not trauma   Relieved by:  Nothing Worsened by:  Nothing Ineffective treatments: antibiotics. Associated symptoms: nausea   Associated symptoms: no chest pain, no constipation, no diarrhea, no dysuria, no fever, no hematochezia, no melena, no shortness of breath, no sore throat, no vaginal bleeding, no vaginal discharge and no vomiting       Past Medical History:  Diagnosis Date   Acid reflux    Arthritis    Baker's cyst of knee    Change in voice    Cough    Endometriosis    H/O: hysterectomy    Hypothyroidism    Nasal congestion    Osteoporosis    Thyroid nodule     Patient Active Problem List   Diagnosis Date Noted   DOE (dyspnea on exertion) 09/30/2016   Upper airway cough syndrome 08/29/2016   Ankle fracture, right, closed, initial encounter 07/16/2016    Multinodular goiter (nontoxic), dominant left thyroid nodule 11/12/2010   SINUSITIS 07/09/2006    Past Surgical History:  Procedure Laterality Date   ABDOMINAL HYSTERECTOMY  1988   SINUS SURGERY WITH INSTATRAK  2008     OB History   No obstetric history on file.     Family History  Problem Relation Age of Onset   Diabetes Father    Breast cancer Mother    Cancer Mother        breast   Cancer Brother        lymphoma   Cancer Paternal Grandmother        breast   Cancer Maternal Uncle        colon   Thyroid disease Sister    Arthritis Sister    Cancer Maternal Aunt        breast    Social History   Tobacco Use   Smoking status: Never   Smokeless tobacco: Never  Vaping Use   Vaping Use: Never used  Substance Use Topics   Alcohol use: No   Drug use: No    Home Medications Prior to Admission medications   Medication Sig Start Date End Date Taking? Authorizing Provider  aspirin 81 MG EC tablet Take 1 tablet (81 mg total) by mouth 2 (two) times daily. 04/18/20     calcitonin, salmon, (MIACALCIN/FORTICAL) 200  UNIT/ACT nasal spray 1 spray daily. 08/17/19   [provider]  celecoxib (CELEBREX) 200 MG capsule TAKE 1 CAPSULE BY MOUTH ONCE A DAY WITH FOOD 03/23/20 03/23/21  Rosita Kea, PA-C  celecoxib (CELEBREX) 200 MG capsule Take 1 capsule (200 mg total) by mouth daily with food 06/23/20     chlorhexidine (PERIDEX) 0.12 % solution RINSE MOUTH WITH 1/2 OUNCE 2 TIMES DAILY 01/20/20 01/19/21    COVID-19 At Home Antigen Test (CARESTART COVID-19 HOME TEST) KIT use as directed 06/08/20   Jefm Bryant, RPH  cyclobenzaprine (FLEXERIL) 10 MG tablet Take 10 mg by mouth 3 (three) times daily as needed.     [provider]  denosumab (PROLIA) 60 MG/ML SOSY injection INJECT 1 ML EVERY 6 MONTHS 06/20/20 06/20/21  Tresa Garter, MD  dicyclomine (BENTYL) 10 MG capsule Take 2 capsules (20 mg total) by mouth 3 (three) times daily. 01/22/30     folic acid (FOLVITE) 1 MG  tablet Take 1 mg by mouth 3 (three) times daily.     [provider]  folic acid (FOLVITE) 1 MG tablet TAKE 3 TABLETS BY MOUTH ONCE DAILY. 09/22/19 09/21/20  Rosita Kea, PA-C  hydrochlorothiazide (MICROZIDE) 12.5 MG capsule TAKE 1 CAPSULE (12.5 MG TOTAL) BY MOUTH DAILY. 10/08/19 10/07/20  Kate Sable, MD  HYDROcodone-acetaminophen (NORCO/VICODIN) 5-325 MG tablet TAKE 1 TABLET BY MOUTH 2 TIMES A DAY AS NEEDED FOR PAIN FOR 5 DAYS. 03/02/20 08/29/20  Dian Queen, MD  ibuprofen (ADVIL) 800 MG tablet Take 800 mg by mouth every 6 (six) hours as needed.  06/15/19   [provider]  metoprolol tartrate (LOPRESSOR) 100 MG tablet Take 1 tablet (100 mg total) by mouth once for 1 dose. Take 2 hours prior to your CT scan. 10/08/19 10/08/19  Kate Sable, MD  metroNIDAZOLE (FLAGYL) 500 MG tablet Take 1 tablet (500 mg total) by mouth 3 (three) times daily. 07/24/20     oxyCODONE (OXY IR/ROXICODONE) 5 MG immediate release tablet Take 1 tablet (5 mg total) by mouth every 4 (four) hours as needed for 3 days 04/18/20     pantoprazole (PROTONIX) 40 MG tablet Take 40 mg by mouth daily.      [provider]  pantoprazole (PROTONIX) 40 MG tablet TAKE 1 TABLET BY MOUTH TWICE DAILY 03/02/20 03/02/21  Dian Queen, MD  predniSONE (DELTASONE) 1 MG tablet TAKE 4 TABLETS BY MOUTH WITH FOOD OR MILK ONCE A DAY 12/24/19 12/23/20  Rosita Kea, PA-C  predniSONE (DELTASONE) 5 MG tablet Take 5 mg by mouth daily with breakfast.     [provider]  predniSONE (DELTASONE) 5 MG tablet TAKE 1 TABLET BY MOUTH DAILY 10/21/19 10/20/20  Rosita Kea, PA-C  predniSONE (DELTASONE) 5 MG tablet TAKE ALL 6 TABLETS BY MOUTH ON DAY 1, THEN DECREASE BY ONE TABLET EACH DAY (6-5-4-3-2-1) - TAKE ALL DOSES WITH FOOD 10/13/19 10/12/20  Susa Day, MD  predniSONE (DELTASONE) 5 MG tablet Take 1 tablet (5 mg total) by mouth daily. 06/23/20     sarilumab (KEVZARA) 200 MG/1.14ML SOSY INJECT 1 SYRINGE INTO THE SKIN EVERY  14 (FOURTEEN) DAYS. 05/31/20   Tresa Garter, MD  sarilumab (KEVZARA) 200 MG/1.14ML SOSY Inject 1.14 ml subcutaneously bi-weekly 06/23/20   Tresa Garter, MD  spironolactone (ALDACTONE) 25 MG tablet TAKE 2 TABLETS (50 MG TOTAL) BY MOUTH DAILY. 10/08/19 10/07/20  Kate Sable, MD  sulfamethoxazole-trimethoprim (BACTRIM DS) 800-160 MG tablet Take 1 tablet by mouth 2 (two) times  daily. 07/24/20     Vitamin D, Ergocalciferol, (DRISDOL) 1.25 MG (50000 UNIT) CAPS capsule Take 50,000 Units by mouth once a week. 09/21/19   [provider]    Allergies    Moxifloxacin  Review of Systems   Review of Systems  Constitutional:  Negative for fever.  HENT:  Negative for sore throat.   Eyes:  Negative for visual disturbance.  Respiratory:  Negative for shortness of breath.   Cardiovascular:  Negative for chest pain.  Gastrointestinal:  Positive for abdominal pain, nausea and rectal pain. Negative for blood in stool, constipation, diarrhea, hematochezia, melena and vomiting.  Genitourinary:  Negative for dysuria, vaginal bleeding and vaginal discharge.  Musculoskeletal:  Negative for neck pain.  Skin:  Negative for rash.  Neurological:  Negative for headaches.   Physical Exam Updated Vital Signs BP (!) 150/79 (BP Location: Right Arm)   Pulse 82   Temp 98.2 F (36.8 C) (Oral)   Resp 16   SpO2 97%   Physical Exam Vitals and nursing note reviewed.  Constitutional:      General: She is not in acute distress.    Appearance: Normal appearance. She is well-developed.  HENT:     Head: Normocephalic and atraumatic.  Eyes:     Conjunctiva/sclera: Conjunctivae normal.  Cardiovascular:     Rate and Rhythm: Normal rate and regular rhythm.     Heart sounds: No murmur heard. Pulmonary:     Effort: Pulmonary effort is normal. No respiratory distress.     Breath sounds: Normal breath sounds.  Abdominal:     Palpations: Abdomen is soft.     Tenderness: There is abdominal  tenderness in the suprapubic area and left lower quadrant. There is no guarding or rebound.  Musculoskeletal:        General: Normal range of motion.     Cervical back: Neck supple.     Right lower leg: No edema.     Left lower leg: No edema.  Skin:    General: Skin is warm and dry.  Neurological:     General: No focal deficit present.     Mental Status: She is alert.    ED Results / Procedures / Treatments   Labs (all labs ordered are listed, but only abnormal results are displayed) Labs Reviewed  COMPREHENSIVE METABOLIC PANEL - Abnormal; Notable for the following components:      Result Value   Total Protein 6.3 (*)    Alkaline Phosphatase 34 (*)    All other components within normal limits  URINALYSIS, ROUTINE W REFLEX MICROSCOPIC - Abnormal; Notable for the following components:   Color, Urine COLORLESS (*)    Specific Gravity, Urine <1.005 (*)    All other components within normal limits  URINE CULTURE  LIPASE, BLOOD  CBC WITH DIFFERENTIAL/PLATELET    EKG None  Radiology CT Abdomen Pelvis W Contrast  Result Date: 07/30/2020 CLINICAL DATA:  Diverticulitis suspected. Antibiotic initiated 6 days prior. EXAM: CT ABDOMEN AND PELVIS WITH CONTRAST TECHNIQUE: Multidetector CT imaging of the abdomen and pelvis was performed using the standard protocol following bolus administration of intravenous contrast. CONTRAST:  141mL OMNIPAQUE IOHEXOL 300 MG/ML  SOLN COMPARISON:  CT 07/10/2020 FINDINGS: Lower chest: Lung bases are clear. Hepatobiliary: Low-density lesion liver consistent with benign cysts. No biliary duct dilatation. Normal gallbladder Pancreas: Pancreas is normal. No ductal dilatation. No pancreatic inflammation. Spleen: Normal spleen Adrenals/urinary tract: Adrenal glands and kidneys are normal. The ureters and bladder normal. Stomach/Bowel: Stomach, small bowel,  appendix, and cecum are normal. There is interval resolution of the inflammatory process involving the sigmoid  colon. Multiple sigmoid colon diverticula remain without acute inflammation. No perforation or abscess. Vascular/Lymphatic: Abdominal aorta is normal caliber. No periportal or retroperitoneal adenopathy. No pelvic adenopathy. Reproductive: Post hysterectomy.  Adnexa unremarkable Other: No free fluid. Musculoskeletal: No aggressive osseous lesion. IMPRESSION: 1. Resolution of acute sigmoid diverticulitis demonstrated on comparison CT 07/09/2020. No complicating features. 2. No acute findings in the abdomen pelvis. Electronically Signed   By: Suzy Bouchard M.D.   On: 07/30/2020 16:17    Procedures Procedures   Medications Ordered in ED Medications  morphine 4 MG/ML injection 4 mg (4 mg Intravenous Not Given 07/30/20 1431)  ondansetron (ZOFRAN) injection 4 mg (4 mg Intravenous Not Given 07/30/20 1431)  sodium chloride 0.9 % bolus 1,000 mL (1,000 mLs Intravenous New Bag/Given 07/30/20 1418)  iohexol (OMNIPAQUE) 300 MG/ML solution 100 mL (100 mLs Intravenous Contrast Given 07/30/20 1546)  iohexol (OMNIPAQUE) 9 MG/ML oral solution 500 mL (500 mLs Oral Contrast Given 07/30/20 1548)    ED Course  I have reviewed the triage vital signs and the nursing notes.  Pertinent labs & imaging results that were available during my care of the patient were reviewed by me and considered in my medical decision making (see chart for details).    MDM Rules/Calculators/A&P                         This patient complains of lower abdominal pain rectal discomfort; this involves an extensive number of treatment Options and is a complaint that carries with it a high risk of complications and Morbidity. The differential includes diverticulitis, colitis, constipation, UTI  I ordered, reviewed and interpreted labs, which included CBC with normal white count normal hemoglobin, chemistries and LFTs fairly normal, urinalysis unremarkable I ordered medication IV fluids pain and nausea medication I ordered imaging studies which  included CT abdomen and pelvis and and this is pending at time of signout to my partner Dr. Regenia Skeeter Previous records obtained and reviewed in epic including prior ED visit last month  After the interventions stated above, I reevaluated the patient and found patient to be hemodynamically stable.  Nonsurgical exam.  She is pending CT abdomen and pelvis.  Anticipate can be discharged to follow-up with her treating providers if no acute findings on CT.   Final Clinical Impression(s) / ED Diagnoses Final diagnoses:  Lower abdominal pain    Rx / DC Orders ED Discharge Orders     None        Hayden Rasmussen, MD 07/30/20 1729

## 2020-07-30 NOTE — Discharge Instructions (Signed)
If you develop worsening, continued, or recurrent abdominal pain, uncontrolled vomiting, fever, chest or back pain, or any other new/concerning symptoms then return to the ER for evaluation.  

## 2020-07-30 NOTE — ED Notes (Signed)
Pt dc home with belongings. Pt stated understanding of dc instrucitons.

## 2020-07-30 NOTE — ED Provider Notes (Signed)
CT scan is unremarkable.  Labs are also unremarkable.  At this point I do not think she needs further antibiotics as there is no more diverticulitis.  She is concerned this pressure in her lower abdomen might be related to a UTI but she has no dysuria.  Her urinalysis is clear.  I will send for culture as she has high concern but I do not think antibiotics are warranted for now.  Follow-up with PCP.   Sherwood Gambler, MD 07/30/20 [phone]

## 2020-07-31 DIAGNOSIS — M1712 Unilateral primary osteoarthritis, left knee: Secondary | ICD-10-CM | POA: Diagnosis not present

## 2020-08-01 LAB — URINE CULTURE: Culture: <10000 — AB

## 2020-08-04 DIAGNOSIS — R197 Diarrhea, unspecified: Secondary | ICD-10-CM | POA: Diagnosis not present

## 2020-08-07 ENCOUNTER — Other Ambulatory Visit (HOSPITAL_COMMUNITY): Payer: Self-pay

## 2020-08-07 DIAGNOSIS — M1712 Unilateral primary osteoarthritis, left knee: Secondary | ICD-10-CM | POA: Diagnosis not present

## 2020-08-07 DIAGNOSIS — M25562 Pain in left knee: Secondary | ICD-10-CM | POA: Diagnosis not present

## 2020-08-07 MED ORDER — VANCOMYCIN HCL 125 MG PO CAPS
125.0000 mg | ORAL_CAPSULE | Freq: Four times a day (QID) | ORAL | 0 refills | Status: AC
  Filled 2020-08-07: qty 56, 14d supply, fill #0

## 2020-08-08 ENCOUNTER — Other Ambulatory Visit (HOSPITAL_COMMUNITY): Payer: Self-pay

## 2020-08-14 DIAGNOSIS — M1712 Unilateral primary osteoarthritis, left knee: Secondary | ICD-10-CM | POA: Diagnosis not present

## 2020-08-18 ENCOUNTER — Other Ambulatory Visit (HOSPITAL_COMMUNITY): Payer: Self-pay

## 2020-08-18 DIAGNOSIS — M545 Low back pain, unspecified: Secondary | ICD-10-CM | POA: Diagnosis not present

## 2020-08-18 MED ORDER — PREDNISONE 20 MG PO TABS
ORAL_TABLET | ORAL | 0 refills | Status: DC
  Filled 2020-08-18: qty 10, 5d supply, fill #0

## 2020-08-21 ENCOUNTER — Other Ambulatory Visit (HOSPITAL_COMMUNITY): Payer: Self-pay

## 2020-08-21 DIAGNOSIS — R309 Painful micturition, unspecified: Secondary | ICD-10-CM | POA: Diagnosis not present

## 2020-08-21 MED ORDER — PHENAZOPYRIDINE HCL 200 MG PO TABS
200.0000 mg | ORAL_TABLET | Freq: Three times a day (TID) | ORAL | 0 refills | Status: DC
  Filled 2020-08-21: qty 9, 3d supply, fill #0

## 2020-08-21 MED ORDER — SULFAMETHOXAZOLE-TRIMETHOPRIM 800-160 MG PO TABS
1.0000 | ORAL_TABLET | Freq: Two times a day (BID) | ORAL | 0 refills | Status: DC
  Filled 2020-08-21: qty 14, 7d supply, fill #0

## 2020-08-22 ENCOUNTER — Other Ambulatory Visit (HOSPITAL_COMMUNITY): Payer: Self-pay

## 2020-08-24 ENCOUNTER — Other Ambulatory Visit (HOSPITAL_COMMUNITY): Payer: Self-pay

## 2020-08-28 ENCOUNTER — Other Ambulatory Visit (HOSPITAL_COMMUNITY): Payer: Self-pay

## 2020-09-04 ENCOUNTER — Other Ambulatory Visit (HOSPITAL_COMMUNITY): Payer: Self-pay

## 2020-09-04 DIAGNOSIS — Z8619 Personal history of other infectious and parasitic diseases: Secondary | ICD-10-CM | POA: Diagnosis not present

## 2020-09-04 DIAGNOSIS — K5792 Diverticulitis of intestine, part unspecified, without perforation or abscess without bleeding: Secondary | ICD-10-CM | POA: Diagnosis not present

## 2020-09-04 MED ORDER — PREDNISONE 5 MG PO TABS
5.0000 mg | ORAL_TABLET | Freq: Every day | ORAL | 1 refills | Status: DC
  Filled 2020-09-04 – 2020-10-02 (×2): qty 90, 90d supply, fill #0
  Filled 2021-01-05: qty 90, 90d supply, fill #1

## 2020-09-04 MED FILL — Pantoprazole Sodium EC Tab 40 MG (Base Equiv): ORAL | 30 days supply | Qty: 60 | Fill #1 | Status: AC

## 2020-09-06 ENCOUNTER — Other Ambulatory Visit (HOSPITAL_COMMUNITY): Payer: Self-pay

## 2020-09-06 DIAGNOSIS — I700 Atherosclerosis of aorta: Secondary | ICD-10-CM | POA: Diagnosis not present

## 2020-09-06 DIAGNOSIS — R399 Unspecified symptoms and signs involving the genitourinary system: Secondary | ICD-10-CM | POA: Diagnosis not present

## 2020-09-06 DIAGNOSIS — M069 Rheumatoid arthritis, unspecified: Secondary | ICD-10-CM | POA: Diagnosis not present

## 2020-09-06 DIAGNOSIS — M8000XA Age-related osteoporosis with current pathological fracture, unspecified site, initial encounter for fracture: Secondary | ICD-10-CM | POA: Diagnosis not present

## 2020-09-06 DIAGNOSIS — Z0000 Encounter for general adult medical examination without abnormal findings: Secondary | ICD-10-CM | POA: Diagnosis not present

## 2020-09-06 DIAGNOSIS — Z13220 Encounter for screening for lipoid disorders: Secondary | ICD-10-CM | POA: Diagnosis not present

## 2020-09-06 DIAGNOSIS — I10 Essential (primary) hypertension: Secondary | ICD-10-CM | POA: Diagnosis not present

## 2020-09-06 MED ORDER — CELECOXIB 200 MG PO CAPS
200.0000 mg | ORAL_CAPSULE | Freq: Every day | ORAL | 2 refills | Status: DC
  Filled 2020-12-04: qty 30, 30d supply, fill #0
  Filled 2020-12-28: qty 30, 30d supply, fill #1
  Filled 2021-02-12: qty 30, 30d supply, fill #2

## 2020-09-13 ENCOUNTER — Other Ambulatory Visit (HOSPITAL_COMMUNITY): Payer: Self-pay

## 2020-09-13 MED ORDER — COLESTIPOL HCL 1 G PO TABS
2.0000 g | ORAL_TABLET | Freq: Every day | ORAL | 0 refills | Status: DC
  Filled 2020-09-13: qty 60, 30d supply, fill #0

## 2020-09-14 DIAGNOSIS — M1712 Unilateral primary osteoarthritis, left knee: Secondary | ICD-10-CM | POA: Diagnosis not present

## 2020-09-14 DIAGNOSIS — M25562 Pain in left knee: Secondary | ICD-10-CM | POA: Diagnosis not present

## 2020-09-14 DIAGNOSIS — R197 Diarrhea, unspecified: Secondary | ICD-10-CM | POA: Diagnosis not present

## 2020-09-27 DIAGNOSIS — M8080XA Other osteoporosis with current pathological fracture, unspecified site, initial encounter for fracture: Secondary | ICD-10-CM | POA: Diagnosis not present

## 2020-09-27 DIAGNOSIS — Z79899 Other long term (current) drug therapy: Secondary | ICD-10-CM | POA: Diagnosis not present

## 2020-09-27 DIAGNOSIS — M0589 Other rheumatoid arthritis with rheumatoid factor of multiple sites: Secondary | ICD-10-CM | POA: Diagnosis not present

## 2020-09-27 DIAGNOSIS — M545 Low back pain, unspecified: Secondary | ICD-10-CM | POA: Diagnosis not present

## 2020-09-27 DIAGNOSIS — M542 Cervicalgia: Secondary | ICD-10-CM | POA: Diagnosis not present

## 2020-09-27 DIAGNOSIS — Z6833 Body mass index (BMI) 33.0-33.9, adult: Secondary | ICD-10-CM | POA: Diagnosis not present

## 2020-09-27 DIAGNOSIS — M150 Primary generalized (osteo)arthritis: Secondary | ICD-10-CM | POA: Diagnosis not present

## 2020-09-27 DIAGNOSIS — G6289 Other specified polyneuropathies: Secondary | ICD-10-CM | POA: Diagnosis not present

## 2020-09-27 DIAGNOSIS — E669 Obesity, unspecified: Secondary | ICD-10-CM | POA: Diagnosis not present

## 2020-09-28 ENCOUNTER — Other Ambulatory Visit (HOSPITAL_COMMUNITY): Payer: Self-pay

## 2020-10-02 ENCOUNTER — Other Ambulatory Visit (HOSPITAL_COMMUNITY): Payer: Self-pay

## 2020-10-04 ENCOUNTER — Other Ambulatory Visit (HOSPITAL_COMMUNITY): Payer: Self-pay

## 2020-10-12 ENCOUNTER — Other Ambulatory Visit (HOSPITAL_COMMUNITY): Payer: Self-pay

## 2020-10-27 ENCOUNTER — Encounter: Payer: Self-pay | Admitting: Orthopedic Surgery

## 2020-10-27 ENCOUNTER — Ambulatory Visit: Payer: 59 | Admitting: Orthopedic Surgery

## 2020-10-27 ENCOUNTER — Other Ambulatory Visit: Payer: Self-pay

## 2020-10-27 ENCOUNTER — Ambulatory Visit (INDEPENDENT_AMBULATORY_CARE_PROVIDER_SITE_OTHER): Payer: 59

## 2020-10-27 VITALS — BP 127/78 | HR 71 | Ht 67.5 in | Wt 229.4 lb

## 2020-10-27 DIAGNOSIS — M79641 Pain in right hand: Secondary | ICD-10-CM | POA: Diagnosis not present

## 2020-10-27 DIAGNOSIS — M151 Heberden's nodes (with arthropathy): Secondary | ICD-10-CM | POA: Diagnosis not present

## 2020-10-27 NOTE — Progress Notes (Signed)
Office Visit Note   Patient: Olivia Rubio           Date of Birth: 23-Sep-1955           MRN: [id number] Visit Date: 10/27/2020              Requested by: No referring provider defined for this encounter. PCP: Maurice Small, MD (Inactive)   Assessment & Plan: Visit Diagnoses:  1. Pain in right hand   2. 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  She does have some mild nail deformity secondary to mucus cysts.  At this point, her symptoms don't bother her enough to pursue operative management.  She wants to continue treating her symptoms until her pain worsens.  She will call the office if and when she wants to discuss surgical treatment.   Follow-Up Instructions: No follow-ups on file.   Orders:  Orders Placed This Encounter  Procedures   XR Hand Complete Right   No orders of the defined types were placed in this encounter.     Procedures: No procedures performed   Clinical Data: No additional findings.   Subjective: Chief Complaint  Patient presents with   Right Hand - New Patient (Initial Visit)    This is a 65 yo RHD F who presents w/ pain at the DIPJ of her right small finger.  This has been going on for at least a year.  She denies any injury to this finger. She has associated swelling of this joint w/ changes in her nail plate.  She has a history of rheumatoid arthritis.  She has no pain in any of her other IP joints.  She hasn't tried any treatments aside from her usual RA therapy.    Review of Systems   Objective: Vital Signs: BP 127/78 (BP Location: Left Arm, Patient Position: Sitting, Cuff Size: Normal)   Pulse 71   Ht 5' 7.5" (1.715 m)   Wt 229 lb 6.4 oz (104.1 kg)   SpO2 98%   BMI 35.40 kg/m   Physical Exam Constitutional:      Appearance:  Normal appearance.  Cardiovascular:     Rate and Rhythm: Normal rate.  Pulmonary:     Effort: Pulmonary effort is normal.  Skin:    General: Skin is warm and dry.     Capillary Refill: Capillary refill takes less than 2 seconds.  Neurological:     General: No focal deficit present.     Mental Status: She is alert.    Right Hand Exam   Tenderness  Right hand tenderness location: Mildly TTP at small finger DIPJ with dorsal swelling.  Other  Erythema: absent Sensation: normal Pulse: present  Comments:  Able to make complete fist with approx 30 deg ROM of the small finger DIPJ.  Dorsal mucus cysts present on both sides of joint.  Mild changes in nail plate.     Specialty Comments:  No specialty comments available.  Imaging: 3V of the right hand taken today are reviewed and interpreted by me. They demonstrate osteoarthritis of her small finger DIP with osteophytes from the middle and distal phalanges on the lateral view.    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 10/27/2020  DOE (dyspnea on exertion) 09/30/2016   Upper airway cough syndrome 08/29/2016   Ankle fracture, right, closed, initial encounter 07/16/2016   Multinodular goiter (nontoxic), dominant left thyroid nodule 11/12/2010   SINUSITIS 07/09/2006   Past Medical History:  Diagnosis Date   Acid reflux    Arthritis    Baker's cyst of knee    Change in voice    Cough    Endometriosis    H/O: hysterectomy    Hypothyroidism    Nasal congestion    Osteoporosis    Thyroid nodule     Family History  Problem Relation Age of Onset   Diabetes Father    Breast cancer Mother    Cancer Mother        breast   Cancer Brother        lymphoma   Cancer Paternal Grandmother        breast   Cancer Maternal Uncle        colon   Thyroid disease Sister    Arthritis Sister    Cancer Maternal Aunt         breast    Past Surgical History:  Procedure Laterality Date   ABDOMINAL HYSTERECTOMY  1988   SINUS SURGERY WITH INSTATRAK  2008   Social History   Occupational History   Not on file  Tobacco Use   Smoking status: Never   Smokeless tobacco: Never  Vaping Use   Vaping Use: Never used  Substance and Sexual Activity   Alcohol use: No   Drug use: No   Sexual activity: Not on file

## 2020-11-01 ENCOUNTER — Other Ambulatory Visit (HOSPITAL_COMMUNITY): Payer: Self-pay

## 2020-11-06 ENCOUNTER — Other Ambulatory Visit (HOSPITAL_COMMUNITY): Payer: Self-pay

## 2020-11-07 ENCOUNTER — Other Ambulatory Visit (HOSPITAL_COMMUNITY): Payer: Self-pay

## 2020-11-13 ENCOUNTER — Other Ambulatory Visit (HOSPITAL_COMMUNITY): Payer: Self-pay

## 2020-11-13 DIAGNOSIS — Z23 Encounter for immunization: Secondary | ICD-10-CM | POA: Diagnosis not present

## 2020-11-13 DIAGNOSIS — E049 Nontoxic goiter, unspecified: Secondary | ICD-10-CM | POA: Diagnosis not present

## 2020-11-13 DIAGNOSIS — M810 Age-related osteoporosis without current pathological fracture: Secondary | ICD-10-CM | POA: Diagnosis not present

## 2020-11-13 DIAGNOSIS — E213 Hyperparathyroidism, unspecified: Secondary | ICD-10-CM | POA: Diagnosis not present

## 2020-11-13 DIAGNOSIS — E559 Vitamin D deficiency, unspecified: Secondary | ICD-10-CM | POA: Diagnosis not present

## 2020-11-13 DIAGNOSIS — M069 Rheumatoid arthritis, unspecified: Secondary | ICD-10-CM | POA: Diagnosis not present

## 2020-11-14 ENCOUNTER — Other Ambulatory Visit: Payer: Self-pay | Admitting: Endocrinology

## 2020-11-14 ENCOUNTER — Other Ambulatory Visit (HOSPITAL_COMMUNITY): Payer: Self-pay

## 2020-11-14 DIAGNOSIS — E049 Nontoxic goiter, unspecified: Secondary | ICD-10-CM

## 2020-11-15 ENCOUNTER — Other Ambulatory Visit (HOSPITAL_COMMUNITY): Payer: Self-pay

## 2020-11-15 MED ORDER — KEVZARA 200 MG/1.14ML ~~LOC~~ SOSY: PREFILLED_SYRINGE | SUBCUTANEOUS | 5 refills | Status: DC

## 2020-11-17 ENCOUNTER — Other Ambulatory Visit (HOSPITAL_COMMUNITY): Payer: Self-pay

## 2020-11-17 MED FILL — Pantoprazole Sodium EC Tab 40 MG (Base Equiv): ORAL | 30 days supply | Qty: 60 | Fill #2 | Status: AC

## 2020-11-29 ENCOUNTER — Ambulatory Visit
Admission: RE | Admit: 2020-11-29 | Discharge: 2020-11-29 | Disposition: A | Discharge status: Home / Self Care | Payer: 59 | Source: Ambulatory Visit | Attending: Endocrinology | Admitting: Endocrinology

## 2020-11-29 ENCOUNTER — Telehealth: Payer: Self-pay | Admitting: Orthopedic Surgery

## 2020-11-29 DIAGNOSIS — E049 Nontoxic goiter, unspecified: Secondary | ICD-10-CM

## 2020-11-29 DIAGNOSIS — E042 Nontoxic multinodular goiter: Secondary | ICD-10-CM | POA: Diagnosis not present

## 2020-11-29 NOTE — Telephone Encounter (Signed)
Pt called stating she discussed having her finger cyst removed with Dr.Benfield, and wanted to know if he can do that in office? Pt would like a CB to discuss the process a bit more.   [phone]

## 2020-12-03 ENCOUNTER — Other Ambulatory Visit (HOSPITAL_COMMUNITY): Payer: Self-pay

## 2020-12-04 ENCOUNTER — Other Ambulatory Visit (HOSPITAL_COMMUNITY): Payer: Self-pay

## 2020-12-04 MED ORDER — CELECOXIB 200 MG PO CAPS
200.0000 mg | ORAL_CAPSULE | Freq: Every day | ORAL | 0 refills | Status: DC
  Filled 2020-12-04 – 2021-09-19 (×2): qty 90, 90d supply, fill #0

## 2020-12-06 ENCOUNTER — Other Ambulatory Visit: Payer: Self-pay | Admitting: Gastroenterology

## 2020-12-06 ENCOUNTER — Other Ambulatory Visit (HOSPITAL_COMMUNITY): Payer: Self-pay

## 2020-12-06 DIAGNOSIS — Z8719 Personal history of other diseases of the digestive system: Secondary | ICD-10-CM | POA: Diagnosis not present

## 2020-12-06 DIAGNOSIS — R1310 Dysphagia, unspecified: Secondary | ICD-10-CM | POA: Diagnosis not present

## 2020-12-11 ENCOUNTER — Other Ambulatory Visit (HOSPITAL_COMMUNITY): Payer: Self-pay

## 2020-12-12 ENCOUNTER — Ambulatory Visit
Admission: RE | Admit: 2020-12-12 | Discharge: 2020-12-12 | Disposition: A | Discharge status: Home / Self Care | Payer: 59 | Source: Ambulatory Visit | Attending: Gastroenterology | Admitting: Gastroenterology

## 2020-12-12 ENCOUNTER — Other Ambulatory Visit: Payer: Self-pay | Admitting: Gastroenterology

## 2020-12-12 DIAGNOSIS — R1310 Dysphagia, unspecified: Secondary | ICD-10-CM

## 2020-12-12 DIAGNOSIS — K449 Diaphragmatic hernia without obstruction or gangrene: Secondary | ICD-10-CM | POA: Diagnosis not present

## 2020-12-13 ENCOUNTER — Other Ambulatory Visit (HOSPITAL_COMMUNITY): Payer: Self-pay

## 2020-12-13 MED ORDER — SUCRALFATE 1 G PO TABS
1.0000 g | ORAL_TABLET | Freq: Two times a day (BID) | ORAL | 1 refills | Status: DC
  Filled 2020-12-13 (×2): qty 60, 30d supply, fill #0

## 2020-12-15 ENCOUNTER — Other Ambulatory Visit (HOSPITAL_COMMUNITY): Payer: Self-pay

## 2020-12-15 DIAGNOSIS — M1712 Unilateral primary osteoarthritis, left knee: Secondary | ICD-10-CM | POA: Diagnosis not present

## 2020-12-15 DIAGNOSIS — M25562 Pain in left knee: Secondary | ICD-10-CM | POA: Diagnosis not present

## 2020-12-15 MED ORDER — PREDNISONE 5 MG PO TABS
ORAL_TABLET | ORAL | 1 refills | Status: DC
  Filled 2020-12-15: qty 21, 6d supply, fill #0

## 2020-12-15 MED ORDER — HYDROCODONE-ACETAMINOPHEN 5-325 MG PO TABS
1.0000 | ORAL_TABLET | ORAL | 0 refills | Status: DC
  Filled 2020-12-15: qty 20, 5d supply, fill #0

## 2020-12-18 ENCOUNTER — Other Ambulatory Visit (HOSPITAL_COMMUNITY): Payer: Self-pay

## 2020-12-18 MED FILL — Pantoprazole Sodium EC Tab 40 MG (Base Equiv): ORAL | 30 days supply | Qty: 60 | Fill #3 | Status: AC

## 2020-12-19 ENCOUNTER — Other Ambulatory Visit (HOSPITAL_COMMUNITY): Payer: Self-pay

## 2020-12-25 ENCOUNTER — Other Ambulatory Visit (HOSPITAL_COMMUNITY): Payer: Self-pay

## 2020-12-25 DIAGNOSIS — R300 Dysuria: Secondary | ICD-10-CM | POA: Diagnosis not present

## 2020-12-25 MED ORDER — SULFAMETHOXAZOLE-TRIMETHOPRIM 800-160 MG PO TABS
1.0000 | ORAL_TABLET | Freq: Two times a day (BID) | ORAL | 0 refills | Status: DC
  Filled 2020-12-25: qty 14, 7d supply, fill #0

## 2020-12-26 ENCOUNTER — Other Ambulatory Visit (HOSPITAL_COMMUNITY): Payer: Self-pay

## 2020-12-27 DIAGNOSIS — M0589 Other rheumatoid arthritis with rheumatoid factor of multiple sites: Secondary | ICD-10-CM | POA: Diagnosis not present

## 2020-12-29 ENCOUNTER — Other Ambulatory Visit (HOSPITAL_COMMUNITY): Payer: Self-pay

## 2021-01-01 ENCOUNTER — Other Ambulatory Visit (HOSPITAL_COMMUNITY): Payer: Self-pay

## 2021-01-03 ENCOUNTER — Other Ambulatory Visit: Payer: Self-pay

## 2021-01-03 ENCOUNTER — Ambulatory Visit: Payer: 59 | Attending: Family Medicine | Admitting: Pharmacist

## 2021-01-03 DIAGNOSIS — Z79899 Other long term (current) drug therapy: Secondary | ICD-10-CM

## 2021-01-03 NOTE — Progress Notes (Signed)
S: Patient presents for review of their specialty medication therapy.  Patient is currently taking Kevzara for rheumatoid arthritis. Patient is managed by Dr. Amil Amen for this.   Adherence: denies any missed doses  FDA-approved dosing:Rheumatoid arthritis: SubQ: 200 mg once every 2 weeks.  Efficacy: feels that it still works okay for her but will experience flare ups.   Hematologic toxicity: ANC >1,000/mm3: No dosage adjustment necessary. ANC 500 to 1,000/mm3: Interrupt therapy; when Clio >1,000/mm3, resume at 150 mg once every 2 weeks (may increase to 200 mg once every 2 weeks as clinically appropriate). ANC <500/mm3: Discontinue. Platelets 50,000 to 100,000/mm3: Interrupt therapy; when platelet count is >100,000/mm3, resume at 150 mg once every 2 weeks (may increase to 200 mg once every 2 weeks as clinically appropriate). Platelets <50,000/mm3: If confirmed by repeat testing, discontinue. Nonhematologic toxicity: Hypersensitivity (anaphylaxis or other clinically significant hypersensitivity reaction): Stop immediately and discontinue permanently. Infection (serious): Interrupt treatment until the infection is controlled.   Drug-drug interactions: none  Screenings: TB screening: completed per patient Hepatitis Screening: completed per patient  Monitoring: S/sx of infection: denies S/sx of hypersensitivity: denies Injection site reactions: denies Other adverse effects:denies  O: Followed by RA. Reports blood work WNL  Lab Results  Component Value Date   WBC 4.3 07/30/2020   HGB 15.0 07/30/2020   HCT 44.5 07/30/2020   MCV 94.7 07/30/2020   PLT 169 07/30/2020      Chemistry      Component Value Date/Time   NA 138 07/30/2020 1414   K 3.5 07/30/2020 1414   CL 105 07/30/2020 1414   CO2 25 07/30/2020 1414   BUN 12 07/30/2020 1414   CREATININE 0.91 07/30/2020 1414      Component Value Date/Time   CALCIUM 8.9 07/30/2020 1414   ALKPHOS 34 (L) 07/30/2020 1414   AST 27  07/30/2020 1414   ALT 30 07/30/2020 1414   BILITOT 0.9 07/30/2020 1414      A/P: 1. Medication review: Patient currently on Kevzara for the treatment of rheumatoid arthritis and is tolerating it well. She reports okay control of RA.  Reviewed the medication with the patient, including the following: Sarilumab is an interleukin-6 (IL-6) receptor antagonist which binds to both soluble and membrane-bound IL-6 receptors. Endogenous IL-6 is induced by inflammatory stimuli and mediates a variety of immunological responses. IL-6 produced by synovial and endothelial cells leads to local production of IL-6 in joints affected by inflammatory processes such as rheumatoid arthritis. Inhibition of IL-6 receptors by sarilumab leads to a reduction in CRP levels. The most common adverse effects include increased risk of infection, injection site reactions, and neutropenia. There is a possible adverse effect of increased risk of malignancy but it is not fully understood if this is due to the drug or the disease state itself. The patient was instructed to avoid use of live vaccinations without the approval of a physician. No recommendations for any changes at this time.  Benard Halsted, PharmD, [facility], [place] [phone]

## 2021-01-04 ENCOUNTER — Other Ambulatory Visit (HOSPITAL_COMMUNITY): Payer: Self-pay

## 2021-01-04 MED ORDER — PROLIA 60 MG/ML ~~LOC~~ SOSY
60.0000 mg | PREFILLED_SYRINGE | SUBCUTANEOUS | 0 refills | Status: DC
  Filled 2021-01-04: qty 1, 180d supply, fill #0

## 2021-01-05 ENCOUNTER — Other Ambulatory Visit (HOSPITAL_COMMUNITY): Payer: Self-pay

## 2021-01-09 ENCOUNTER — Other Ambulatory Visit (HOSPITAL_COMMUNITY): Payer: Self-pay

## 2021-01-10 ENCOUNTER — Other Ambulatory Visit (HOSPITAL_COMMUNITY): Payer: Self-pay

## 2021-01-12 ENCOUNTER — Other Ambulatory Visit (HOSPITAL_COMMUNITY): Payer: Self-pay

## 2021-01-12 ENCOUNTER — Telehealth: Payer: 59 | Admitting: Physician Assistant

## 2021-01-12 DIAGNOSIS — J019 Acute sinusitis, unspecified: Secondary | ICD-10-CM

## 2021-01-12 DIAGNOSIS — B9689 Other specified bacterial agents as the cause of diseases classified elsewhere: Secondary | ICD-10-CM

## 2021-01-12 MED ORDER — AMOXICILLIN-POT CLAVULANATE 875-125 MG PO TABS
1.0000 | ORAL_TABLET | Freq: Two times a day (BID) | ORAL | 0 refills | Status: DC
  Filled 2021-01-12: qty 14, 7d supply, fill #0

## 2021-01-12 NOTE — Progress Notes (Signed)

## 2021-01-12 NOTE — Progress Notes (Signed)
I have spent 5 minutes in review of e-visit questionnaire, review and updating patient chart, medical decision making and response to patient.   Marice Guidone Cody Jonovan Boedecker, PA-C    

## 2021-01-15 ENCOUNTER — Other Ambulatory Visit (HOSPITAL_COMMUNITY): Payer: Self-pay

## 2021-01-17 ENCOUNTER — Other Ambulatory Visit: Payer: Self-pay

## 2021-01-17 ENCOUNTER — Other Ambulatory Visit (HOSPITAL_COMMUNITY): Payer: Self-pay

## 2021-01-17 MED FILL — Pantoprazole Sodium EC Tab 40 MG (Base Equiv): ORAL | 30 days supply | Qty: 60 | Fill #4 | Status: AC

## 2021-01-18 ENCOUNTER — Other Ambulatory Visit: Payer: Self-pay | Admitting: Gastroenterology

## 2021-01-18 ENCOUNTER — Other Ambulatory Visit (HOSPITAL_COMMUNITY): Payer: Self-pay

## 2021-01-18 DIAGNOSIS — R197 Diarrhea, unspecified: Secondary | ICD-10-CM

## 2021-01-18 DIAGNOSIS — Z8619 Personal history of other infectious and parasitic diseases: Secondary | ICD-10-CM | POA: Diagnosis not present

## 2021-01-18 DIAGNOSIS — R1030 Lower abdominal pain, unspecified: Secondary | ICD-10-CM

## 2021-01-18 DIAGNOSIS — Z8719 Personal history of other diseases of the digestive system: Secondary | ICD-10-CM | POA: Diagnosis not present

## 2021-01-19 DIAGNOSIS — R1030 Lower abdominal pain, unspecified: Secondary | ICD-10-CM | POA: Diagnosis not present

## 2021-01-19 DIAGNOSIS — R197 Diarrhea, unspecified: Secondary | ICD-10-CM | POA: Diagnosis not present

## 2021-01-19 DIAGNOSIS — Z8619 Personal history of other infectious and parasitic diseases: Secondary | ICD-10-CM | POA: Diagnosis not present

## 2021-01-19 DIAGNOSIS — Z8719 Personal history of other diseases of the digestive system: Secondary | ICD-10-CM | POA: Diagnosis not present

## 2021-01-22 ENCOUNTER — Other Ambulatory Visit (HOSPITAL_COMMUNITY): Payer: Self-pay

## 2021-01-22 MED ORDER — VANCOMYCIN HCL 125 MG PO CAPS
125.0000 mg | ORAL_CAPSULE | Freq: Four times a day (QID) | ORAL | 0 refills | Status: DC
  Filled 2021-01-22: qty 40, 10d supply, fill #0

## 2021-01-22 MED ORDER — DICYCLOMINE HCL 10 MG PO CAPS
20.0000 mg | ORAL_CAPSULE | Freq: Three times a day (TID) | ORAL | 1 refills | Status: DC
  Filled 2021-01-22: qty 180, 30d supply, fill #0

## 2021-01-23 ENCOUNTER — Other Ambulatory Visit (HOSPITAL_COMMUNITY): Payer: Self-pay

## 2021-01-27 ENCOUNTER — Other Ambulatory Visit (HOSPITAL_COMMUNITY): Payer: Self-pay

## 2021-01-27 DIAGNOSIS — Z03818 Encounter for observation for suspected exposure to other biological agents ruled out: Secondary | ICD-10-CM | POA: Diagnosis not present

## 2021-01-27 DIAGNOSIS — J029 Acute pharyngitis, unspecified: Secondary | ICD-10-CM | POA: Diagnosis not present

## 2021-01-27 DIAGNOSIS — N3001 Acute cystitis with hematuria: Secondary | ICD-10-CM | POA: Diagnosis not present

## 2021-01-27 DIAGNOSIS — R5383 Other fatigue: Secondary | ICD-10-CM | POA: Diagnosis not present

## 2021-01-27 DIAGNOSIS — R059 Cough, unspecified: Secondary | ICD-10-CM | POA: Diagnosis not present

## 2021-01-27 MED ORDER — PROMETHAZINE-DM 6.25-15 MG/5ML PO SYRP
ORAL_SOLUTION | ORAL | 0 refills | Status: DC
  Filled 2021-01-27: qty 120, 8d supply, fill #0

## 2021-01-27 MED ORDER — NITROFURANTOIN MONOHYD MACRO 100 MG PO CAPS
100.0000 mg | ORAL_CAPSULE | Freq: Two times a day (BID) | ORAL | 0 refills | Status: DC
  Filled 2021-01-27: qty 14, 7d supply, fill #0

## 2021-01-27 MED ORDER — PHENAZOPYRIDINE HCL 200 MG PO TABS
200.0000 mg | ORAL_TABLET | Freq: Three times a day (TID) | ORAL | 0 refills | Status: DC
  Filled 2021-01-27: qty 6, 2d supply, fill #0

## 2021-01-30 ENCOUNTER — Other Ambulatory Visit (HOSPITAL_COMMUNITY): Payer: Self-pay

## 2021-01-30 ENCOUNTER — Telehealth: Payer: 59 | Admitting: Physician Assistant

## 2021-01-30 DIAGNOSIS — H1013 Acute atopic conjunctivitis, bilateral: Secondary | ICD-10-CM | POA: Diagnosis not present

## 2021-01-30 MED ORDER — AZELASTINE HCL 0.05 % OP SOLN
1.0000 [drp] | Freq: Two times a day (BID) | OPHTHALMIC | 0 refills | Status: DC
  Filled 2021-01-30: qty 6, 30d supply, fill #0

## 2021-01-30 NOTE — Progress Notes (Signed)
I have spent 5 minutes in review of e-visit questionnaire, review and updating patient chart, medical decision making and response to patient.   Geraldine Tesar Cody Ima Hafner, PA-C    

## 2021-01-30 NOTE — Progress Notes (Signed)
E-Visit for [employer]   We are sorry that you are not feeling well.  Here is how we plan to help!  Based on what you have shared with me it looks like you have conjunctivitis.  Conjunctivitis is a common inflammatory or infectious condition of the eye that is often referred to as "pink eye".  In most cases it is contagious (viral or bacterial). However, not all conjunctivitis requires antibiotics (ex. Allergic).  We have made appropriate suggestions for you based upon your presentation.  I have prescribed Optivar eye drops to apply as directed. You need to make sure you are following up with your primary care provider giving everything that has gone on recently.   Pink eye can be highly contagious.  It is typically spread through direct contact with secretions, or contaminated objects or surfaces that one may have touched.  Strict handwashing is suggested with soap and water is urged.  If not available, use alcohol based had sanitizer.  Avoid unnecessary touching of the eye.  If you wear contact lenses, you will need to refrain from wearing them until you see no white discharge from the eye for at least 24 hours after being on medication.  You should see symptom improvement in 1-2 days after starting the medication regimen.  Call us if symptoms are not improved in 1-2 days.  Home Care: Wash your hands often! Do not wear your contacts until you complete your treatment plan. Avoid sharing towels, bed linen, personal items with a person who has pink eye. See attention for anyone in your home with similar symptoms.  Get Help Right Away If: Your symptoms do not improve. You develop blurred or loss of vision. Your symptoms worsen (increased discharge, pain or redness)   Thank you for choosing an e-visit.  Your e-visit answers were reviewed by a board certified advanced clinical practitioner to complete your personal care plan. Depending upon the condition, your plan could have included both over  the counter or prescription medications.  Please review your pharmacy choice. Make sure the pharmacy is open so you can pick up prescription now. If there is a problem, you may contact your provider through [employer] and have the prescription routed to another pharmacy.  Your safety is important to [place]. If you have drug allergies check your prescription carefully.   For the next 24 hours you can use MyChart to ask questions about today's visit, request a non-urgent call back, or ask for a work or school excuse. You will get an email in the next two days asking about your experience. I hope that your e-visit has been valuable and will speed your recovery.

## 2021-01-31 ENCOUNTER — Other Ambulatory Visit (HOSPITAL_COMMUNITY): Payer: Self-pay

## 2021-02-04 DIAGNOSIS — N3001 Acute cystitis with hematuria: Secondary | ICD-10-CM | POA: Diagnosis not present

## 2021-02-06 ENCOUNTER — Other Ambulatory Visit (HOSPITAL_COMMUNITY): Payer: Self-pay

## 2021-02-07 ENCOUNTER — Ambulatory Visit
Admission: RE | Admit: 2021-02-07 | Discharge: 2021-02-07 | Disposition: A | Discharge status: Home / Self Care | Payer: 59 | Source: Ambulatory Visit | Attending: Gastroenterology | Admitting: Gastroenterology

## 2021-02-07 ENCOUNTER — Other Ambulatory Visit (HOSPITAL_COMMUNITY): Payer: Self-pay

## 2021-02-07 ENCOUNTER — Other Ambulatory Visit: Payer: Self-pay

## 2021-02-07 DIAGNOSIS — N281 Cyst of kidney, acquired: Secondary | ICD-10-CM | POA: Diagnosis not present

## 2021-02-07 DIAGNOSIS — R197 Diarrhea, unspecified: Secondary | ICD-10-CM

## 2021-02-07 DIAGNOSIS — K5730 Diverticulosis of large intestine without perforation or abscess without bleeding: Secondary | ICD-10-CM | POA: Diagnosis not present

## 2021-02-07 DIAGNOSIS — K449 Diaphragmatic hernia without obstruction or gangrene: Secondary | ICD-10-CM | POA: Diagnosis not present

## 2021-02-07 DIAGNOSIS — K7689 Other specified diseases of liver: Secondary | ICD-10-CM | POA: Diagnosis not present

## 2021-02-07 DIAGNOSIS — R1030 Lower abdominal pain, unspecified: Secondary | ICD-10-CM

## 2021-02-07 MED ORDER — IOPAMIDOL (ISOVUE-300) INJECTION 61%
100.0000 mL | Freq: Once | INTRAVENOUS | Status: AC | PRN
  Administered 2021-02-07: 100 mL via INTRAVENOUS

## 2021-02-07 MED ORDER — SULFAMETHOXAZOLE-TRIMETHOPRIM 800-160 MG PO TABS
1.0000 | ORAL_TABLET | Freq: Two times a day (BID) | ORAL | 0 refills | Status: DC
  Filled 2021-02-07: qty 10, 5d supply, fill #0

## 2021-02-08 ENCOUNTER — Other Ambulatory Visit (HOSPITAL_COMMUNITY): Payer: Self-pay

## 2021-02-08 ENCOUNTER — Other Ambulatory Visit: Payer: 59

## 2021-02-12 ENCOUNTER — Other Ambulatory Visit (HOSPITAL_COMMUNITY): Payer: Self-pay

## 2021-02-12 DIAGNOSIS — M810 Age-related osteoporosis without current pathological fracture: Secondary | ICD-10-CM | POA: Diagnosis not present

## 2021-02-12 DIAGNOSIS — K219 Gastro-esophageal reflux disease without esophagitis: Secondary | ICD-10-CM | POA: Diagnosis not present

## 2021-02-12 MED ORDER — SUCRALFATE 1 G PO TABS
1.0000 g | ORAL_TABLET | Freq: Two times a day (BID) | ORAL | 0 refills | Status: DC
  Filled 2021-02-12: qty 20, 10d supply, fill #0
  Filled 2021-02-12: qty 40, 20d supply, fill #0

## 2021-02-12 MED FILL — Pantoprazole Sodium EC Tab 40 MG (Base Equiv): ORAL | 30 days supply | Qty: 60 | Fill #5 | Status: AC

## 2021-02-27 ENCOUNTER — Other Ambulatory Visit (HOSPITAL_COMMUNITY): Payer: Self-pay

## 2021-02-27 DIAGNOSIS — M25562 Pain in left knee: Secondary | ICD-10-CM | POA: Diagnosis not present

## 2021-02-27 DIAGNOSIS — M25561 Pain in right knee: Secondary | ICD-10-CM | POA: Diagnosis not present

## 2021-02-27 MED ORDER — HYDROCODONE-ACETAMINOPHEN 5-325 MG PO TABS
1.0000 | ORAL_TABLET | Freq: Two times a day (BID) | ORAL | 0 refills | Status: DC | PRN
  Filled 2021-02-27: qty 14, 7d supply, fill #0

## 2021-03-01 ENCOUNTER — Other Ambulatory Visit (HOSPITAL_COMMUNITY): Payer: Self-pay

## 2021-03-02 ENCOUNTER — Other Ambulatory Visit (HOSPITAL_COMMUNITY): Payer: Self-pay

## 2021-03-05 ENCOUNTER — Other Ambulatory Visit (HOSPITAL_COMMUNITY): Payer: Self-pay

## 2021-03-06 ENCOUNTER — Other Ambulatory Visit (HOSPITAL_COMMUNITY): Payer: Self-pay

## 2021-03-06 DIAGNOSIS — Z6835 Body mass index (BMI) 35.0-35.9, adult: Secondary | ICD-10-CM | POA: Diagnosis not present

## 2021-03-06 DIAGNOSIS — Z1231 Encounter for screening mammogram for malignant neoplasm of breast: Secondary | ICD-10-CM | POA: Diagnosis not present

## 2021-03-06 DIAGNOSIS — Z1272 Encounter for screening for malignant neoplasm of vagina: Secondary | ICD-10-CM | POA: Diagnosis not present

## 2021-03-06 DIAGNOSIS — Z01419 Encounter for gynecological examination (general) (routine) without abnormal findings: Secondary | ICD-10-CM | POA: Diagnosis not present

## 2021-03-06 DIAGNOSIS — M25562 Pain in left knee: Secondary | ICD-10-CM | POA: Diagnosis not present

## 2021-03-06 MED ORDER — HYDROCODONE-ACETAMINOPHEN 5-325 MG PO TABS
1.0000 | ORAL_TABLET | Freq: Two times a day (BID) | ORAL | 0 refills | Status: DC | PRN
  Filled 2021-03-06: qty 30, 15d supply, fill #0

## 2021-03-06 MED ORDER — PANTOPRAZOLE SODIUM 40 MG PO TBEC
40.0000 mg | DELAYED_RELEASE_TABLET | Freq: Two times a day (BID) | ORAL | 11 refills | Status: DC
  Filled 2021-03-06: qty 60, 30d supply, fill #0
  Filled 2021-03-13: qty 180, 90d supply, fill #0
  Filled 2021-06-05: qty 180, 90d supply, fill #1
  Filled 2021-08-27: qty 180, 90d supply, fill #2
  Filled 2021-12-17: qty 180, 90d supply, fill #3

## 2021-03-13 ENCOUNTER — Other Ambulatory Visit (HOSPITAL_COMMUNITY): Payer: Self-pay

## 2021-03-13 DIAGNOSIS — M25562 Pain in left knee: Secondary | ICD-10-CM | POA: Diagnosis not present

## 2021-03-13 MED ORDER — CELECOXIB 200 MG PO CAPS
200.0000 mg | ORAL_CAPSULE | Freq: Every day | ORAL | 2 refills | Status: DC
  Filled 2021-03-13: qty 30, 30d supply, fill #0
  Filled 2021-04-24: qty 30, 30d supply, fill #1
  Filled 2021-05-29: qty 30, 30d supply, fill #2

## 2021-03-15 DIAGNOSIS — R300 Dysuria: Secondary | ICD-10-CM | POA: Diagnosis not present

## 2021-03-20 ENCOUNTER — Other Ambulatory Visit (HOSPITAL_COMMUNITY): Payer: Self-pay

## 2021-03-20 DIAGNOSIS — U071 COVID-19: Secondary | ICD-10-CM | POA: Diagnosis not present

## 2021-03-20 MED ORDER — NIRMATRELVIR&RITONAVIR 300/100 20 X 150 MG & 10 X 100MG PO TBPK
3.0000 | ORAL_TABLET | Freq: Two times a day (BID) | ORAL | 0 refills | Status: DC
  Filled 2021-03-20: qty 30, 5d supply, fill #0

## 2021-03-26 ENCOUNTER — Other Ambulatory Visit (HOSPITAL_COMMUNITY): Payer: Self-pay

## 2021-03-26 MED ORDER — KEVZARA 200 MG/1.14ML ~~LOC~~ SOSY: 200.0000 mg | PREFILLED_SYRINGE | SUBCUTANEOUS | 5 refills | Status: DC

## 2021-03-27 ENCOUNTER — Other Ambulatory Visit (HOSPITAL_COMMUNITY): Payer: Self-pay

## 2021-03-28 ENCOUNTER — Other Ambulatory Visit (HOSPITAL_COMMUNITY): Payer: Self-pay

## 2021-03-30 ENCOUNTER — Other Ambulatory Visit (HOSPITAL_COMMUNITY): Payer: Self-pay

## 2021-04-02 ENCOUNTER — Other Ambulatory Visit (HOSPITAL_COMMUNITY): Payer: Self-pay

## 2021-04-02 ENCOUNTER — Other Ambulatory Visit: Payer: Self-pay | Admitting: Pharmacist

## 2021-04-02 MED ORDER — KEVZARA 200 MG/1.14ML ~~LOC~~ SOSY
200.0000 mg | PREFILLED_SYRINGE | SUBCUTANEOUS | 5 refills | Status: DC
  Filled 2021-04-02: qty 2.28, 28d supply, fill #0

## 2021-04-02 MED ORDER — PREDNISONE 5 MG PO TABS
5.0000 mg | ORAL_TABLET | Freq: Every day | ORAL | 0 refills | Status: DC
  Filled 2021-04-02: qty 90, 90d supply, fill #0

## 2021-04-12 ENCOUNTER — Other Ambulatory Visit (HOSPITAL_COMMUNITY): Payer: Self-pay

## 2021-04-13 ENCOUNTER — Other Ambulatory Visit (HOSPITAL_COMMUNITY): Payer: Self-pay

## 2021-04-16 DIAGNOSIS — H2513 Age-related nuclear cataract, bilateral: Secondary | ICD-10-CM | POA: Diagnosis not present

## 2021-04-16 DIAGNOSIS — Z135 Encounter for screening for eye and ear disorders: Secondary | ICD-10-CM | POA: Diagnosis not present

## 2021-04-16 DIAGNOSIS — H52223 Regular astigmatism, bilateral: Secondary | ICD-10-CM | POA: Diagnosis not present

## 2021-04-16 DIAGNOSIS — H5203 Hypermetropia, bilateral: Secondary | ICD-10-CM | POA: Diagnosis not present

## 2021-04-19 DIAGNOSIS — M542 Cervicalgia: Secondary | ICD-10-CM | POA: Diagnosis not present

## 2021-04-19 DIAGNOSIS — M1991 Primary osteoarthritis, unspecified site: Secondary | ICD-10-CM | POA: Diagnosis not present

## 2021-04-19 DIAGNOSIS — G6289 Other specified polyneuropathies: Secondary | ICD-10-CM | POA: Diagnosis not present

## 2021-04-19 DIAGNOSIS — M0589 Other rheumatoid arthritis with rheumatoid factor of multiple sites: Secondary | ICD-10-CM | POA: Diagnosis not present

## 2021-04-19 DIAGNOSIS — Z79899 Other long term (current) drug therapy: Secondary | ICD-10-CM | POA: Diagnosis not present

## 2021-04-19 DIAGNOSIS — Z6833 Body mass index (BMI) 33.0-33.9, adult: Secondary | ICD-10-CM | POA: Diagnosis not present

## 2021-04-19 DIAGNOSIS — M25512 Pain in left shoulder: Secondary | ICD-10-CM | POA: Diagnosis not present

## 2021-04-19 DIAGNOSIS — M545 Low back pain, unspecified: Secondary | ICD-10-CM | POA: Diagnosis not present

## 2021-04-19 DIAGNOSIS — M8080XA Other osteoporosis with current pathological fracture, unspecified site, initial encounter for fracture: Secondary | ICD-10-CM | POA: Diagnosis not present

## 2021-04-24 ENCOUNTER — Encounter: Payer: 59 | Attending: Family Medicine | Admitting: Dietician

## 2021-04-24 ENCOUNTER — Other Ambulatory Visit (HOSPITAL_COMMUNITY): Payer: Self-pay

## 2021-04-24 ENCOUNTER — Encounter: Payer: Self-pay | Admitting: Dietician

## 2021-04-24 DIAGNOSIS — E669 Obesity, unspecified: Secondary | ICD-10-CM | POA: Insufficient documentation

## 2021-04-24 NOTE — Progress Notes (Signed)
Medical Nutrition Therapy  ?Appointment Start time:  0263  Appointment End time:  1615 ? ?Primary concerns today: Weight Gain,  GERD, Diverticulits ?Referral diagnosis: N/A ?Preferred learning style: No preference indicated ?Learning readiness: Not ready ? ? ?NUTRITION ASSESSMENT  ? ?Anthropometrics  ?None Taken ? ?Clinical ?Medical Hx: RA, Osteoarthritis, Elevated LFTs, Diverticulitis, GERD, HLD ?Medications: Prolia, Prednisone, Pantoprazole, Celecoxib ?Labs: ALT - 37 ?Notable Signs/Symptoms: Joint Pain, Lower leg swelling ? ?Lifestyle & Dietary Hx ?[facility] Visit 1 of 3 ?EID: [id number] ?Pt is interested in improving their overall health. ?Pt reports unwanted weight gain due to lower back and knee pain over the past 2 years,  ?Pt reports history of GERD with throat irritation, has had their dosage doubled recently and pt reports lessening of symptoms. Pt reports improvement in symptoms when eliminating coffee, and dramatic increase of symptoms when eating too late (After 6:00 PM). ?Pt reports a bout of diverticulitis last year, and still gets occasional lower abdominal pain when eating certain high residue foods. Pt has found it difficult to to get adequate fiber. Pt reports their usual meals consists of a meat, green vegetables, and a starch. ? ? ?Estimated daily fluid intake: 48 oz ?Supplements: Folic Acid, Calcium, Probiotic ?Sleep: Sleeps well, occasionally has trouble falling asleep ?Stress / self-care: Low ?Current average weekly physical activity: ADLs ? ? ?24-Hr Dietary Recall ?First Meal: Coffee w/ sugar free creamer ?Snack: None ?Second Meal: Pintos, brussells sprouts, sweet potatoes ?Snack: None ?Third Meal: Chicken casserole w/cream of chicken soup, butter, pepperidge farm stuffing mix, green beans w/ a little oil, little potato salad, 1 deviled egg ?Snack: Apple ?Beverages: Coffee ? ? ?NUTRITION DIAGNOSIS  ?NB-1.1 Food and nutrition-related knowledge deficit As related to GERD.  As evidenced  by history of reflux, and severe symptoms when eating fatty foods and late at night. ? ? ?NUTRITION INTERVENTION  ?Nutrition education (E-1) on the following topics:  ?Educated pt on potential food sources (high fat foods,spicy foods, high refined sugar foods, caffeine, etc.) that can trigger reflux and/or other GERD symptoms including chest pain. Educated pt to avoid eating within 2-3 hours of going to bed to minimize reflux. Advised pt to sleep with head elevated to decrease reflux. Educated pt on the potential health complications that can occur related to uncontrolled GERD, including stomach ulcers and esophageal erosion/cancer. Educated pt on the potential for B12 deficiency related to acid reducing medication. ? ? ?Handouts Provided Include  ?Insoluble vs. Soluble Fiber List ? ?Learning Style & Readiness for Change ?Teaching method utilized: Visual & Auditory  ?Demonstrated degree of understanding via: Teach Back  ?Barriers to learning/adherence to lifestyle change: Resistance to Change ? ?Goals Established by Pt ?Discuss getting your B12 levels checked when you see your rheumatologist. ?Stick to soft-cooked vegetables and avoid "high-residue" foods like nuts, seeds, corn, popcorn, berries and vegetables with seeds. ?Consider trying Smart Balance plant based butter. ?Work on starting each day with a balanced breakfast of a starch and protein. Try fat-free greek yogurt and fruit, or an english muffin with eggs. ?Work towards eating three meals a day, about 5-6 hours apart! ?Begin to build your meals using the proportions of the Balanced Plate. ?First, select your carb choice(s) for the meal. Make this 25% of your meal. ?Next, select your source of protein to pair with your carb choice(s). Make this another 25% of your meal. ?Finally, complete your meal with a variety of non-starchy vegetables. Make this the remaining 50% of your meal. ? ? ?MONITORING &  EVALUATION ?Dietary intake, weekly physical activity, and  reflux in 1 month. ? ?Next Steps  ?Patient is to follow up with RD. ? ?

## 2021-04-24 NOTE — Patient Instructions (Addendum)
Discuss getting your B12 levels checked when you see your rheumatologist. ? ?Stick to soft-cooked vegetables and avoid "high-residue" foods like nuts, seeds, corn, popcorn, berries and vegetables with seeds. ? ?Consider trying Smart Balance plant based butter. ? ?Work on starting each day with a balanced breakfast of a starch and protein. Try fat-free greek yogurt and fruit, or an english muffin with eggs. ? ?Work towards eating three meals a day, about 5-6 hours apart! ? ?Begin to build your meals using the proportions of the Balanced Plate. ?First, select your carb choice(s) for the meal. Make this 25% of your meal. ?Next, select your source of protein to pair with your carb choice(s). Make this another 25% of your meal. ?Finally, complete your meal with a variety of non-starchy vegetables. Make this the remaining 50% of your meal. ? ?

## 2021-05-04 ENCOUNTER — Other Ambulatory Visit (HOSPITAL_COMMUNITY): Payer: Self-pay

## 2021-05-07 ENCOUNTER — Other Ambulatory Visit (HOSPITAL_COMMUNITY): Payer: Self-pay

## 2021-05-07 ENCOUNTER — Other Ambulatory Visit: Payer: Self-pay | Admitting: Pharmacist

## 2021-05-07 MED ORDER — KEVZARA 200 MG/1.14ML ~~LOC~~ SOSY
PREFILLED_SYRINGE | SUBCUTANEOUS | 5 refills | Status: DC
  Filled 2021-05-07 (×2): qty 2.28, 28d supply, fill #0
  Filled 2021-05-29: qty 2.28, 28d supply, fill #1
  Filled 2021-06-20: qty 2.28, 28d supply, fill #2

## 2021-05-07 MED ORDER — KEVZARA 200 MG/1.14ML ~~LOC~~ SOSY
PREFILLED_SYRINGE | SUBCUTANEOUS | 5 refills | Status: DC
Start: 2021-05-07 — End: 2021-05-07

## 2021-05-14 DIAGNOSIS — K219 Gastro-esophageal reflux disease without esophagitis: Secondary | ICD-10-CM | POA: Diagnosis not present

## 2021-05-14 DIAGNOSIS — R053 Chronic cough: Secondary | ICD-10-CM | POA: Diagnosis not present

## 2021-05-16 ENCOUNTER — Other Ambulatory Visit (HOSPITAL_COMMUNITY): Payer: Self-pay

## 2021-05-16 DIAGNOSIS — N3001 Acute cystitis with hematuria: Secondary | ICD-10-CM | POA: Diagnosis not present

## 2021-05-16 MED ORDER — PHENAZOPYRIDINE HCL 200 MG PO TABS
ORAL_TABLET | ORAL | 0 refills | Status: DC
  Filled 2021-05-16: qty 6, 2d supply, fill #0

## 2021-05-16 MED ORDER — SULFAMETHOXAZOLE-TRIMETHOPRIM 800-160 MG PO TABS
ORAL_TABLET | ORAL | 0 refills | Status: DC
  Filled 2021-05-16: qty 10, 5d supply, fill #0

## 2021-05-20 DIAGNOSIS — R252 Cramp and spasm: Secondary | ICD-10-CM | POA: Diagnosis not present

## 2021-05-21 ENCOUNTER — Other Ambulatory Visit (HOSPITAL_COMMUNITY): Payer: Self-pay

## 2021-05-21 MED ORDER — TIZANIDINE HCL 4 MG PO TABS
4.0000 mg | ORAL_TABLET | Freq: Three times a day (TID) | ORAL | 0 refills | Status: DC | PRN
  Filled 2021-05-21: qty 15, 5d supply, fill #0

## 2021-05-29 ENCOUNTER — Other Ambulatory Visit (HOSPITAL_COMMUNITY): Payer: Self-pay

## 2021-06-04 DIAGNOSIS — K219 Gastro-esophageal reflux disease without esophagitis: Secondary | ICD-10-CM | POA: Diagnosis not present

## 2021-06-04 DIAGNOSIS — R1310 Dysphagia, unspecified: Secondary | ICD-10-CM | POA: Diagnosis not present

## 2021-06-04 DIAGNOSIS — K3189 Other diseases of stomach and duodenum: Secondary | ICD-10-CM | POA: Diagnosis not present

## 2021-06-04 DIAGNOSIS — K449 Diaphragmatic hernia without obstruction or gangrene: Secondary | ICD-10-CM | POA: Diagnosis not present

## 2021-06-04 DIAGNOSIS — K319 Disease of stomach and duodenum, unspecified: Secondary | ICD-10-CM | POA: Diagnosis not present

## 2021-06-04 DIAGNOSIS — Q399 Congenital malformation of esophagus, unspecified: Secondary | ICD-10-CM | POA: Diagnosis not present

## 2021-06-05 ENCOUNTER — Other Ambulatory Visit (HOSPITAL_COMMUNITY): Payer: Self-pay

## 2021-06-06 ENCOUNTER — Other Ambulatory Visit (HOSPITAL_COMMUNITY): Payer: Self-pay

## 2021-06-06 ENCOUNTER — Encounter: Payer: 59 | Attending: Family Medicine | Admitting: Skilled Nursing Facility1

## 2021-06-06 DIAGNOSIS — E631 Imbalance of constituents of food intake: Secondary | ICD-10-CM | POA: Insufficient documentation

## 2021-06-06 NOTE — Progress Notes (Unsigned)
Medical Nutrition Therapy  Appointment Start time:  4193  Appointment End time:  1615  Primary concerns today: Weight Gain,  GERD, Diverticulits Referral diagnosis: N/A Preferred learning style: No preference indicated Learning readiness: Not ready   NUTRITION ASSESSMENT   Anthropometrics  Wt: 217.8  Clinical Medical Hx: RA, Osteoarthritis, Elevated LFTs, Diverticulitis, GERD, HLD Medications: Prolia, Prednisone, Pantoprazole, Celecoxib Labs: ALT - 37 Notable Signs/Symptoms: Joint Pain, Lower leg swelling  Body Composition Scale 06/06/2021  Current Body Weight 217.8  Total Body Fat % 41.6  Visceral Fat 13  Fat-Free Mass % 58.3   Total Body Water % 43.6  Muscle-Mass lbs 31.7  BMI 33.0  Body Fat Displacement          Torso  lbs 56.1         Left Leg  lbs 11.2         Right Leg  lbs 11.2         Left Arm  lbs 5.6         Right Arm   lbs 5.6   Lifestyle & Dietary Hx [facility] Visit 2 of 3 EID: [id number] Pt is interested in improving their overall health. Pt reports unwanted weight gain due to lower back and knee pain over the past 2 years,  Pt reports history of GERD with throat irritation, has had their dosage doubled recently and pt reports lessening of symptoms. Pt reports improvement in symptoms when eliminating coffee, and dramatic increase of symptoms when eating too late (After 6:00 PM). Pt reports a bout of diverticulitis last year, and still gets occasional lower abdominal pain when eating certain high residue foods. Pt has found it difficult to to get adequate fiber. Pt reports their usual meals consists of a meat, green  vegetables, and a starch.   Pt states she wants to lose wt and feel better. Pt states she started a year ago trying to receive nutrition counseling  Pt states there is so much information about dieting, stating I need to re-program my brain. Pt states over the years the family provided what they could afford. Pt states she is not as  mobile as she was before. Pt states she walks some but states her knees hurt. Pt states she is getting help for her knees and may get a knee replacement. Just had a wedding, and still recovering from the hard work from this. Pt states she is dragging  Pt states she is also being seen for a hiatal hernia, stating she may need to get that fixed. Pt states she gets up early, stating she still is not a breakfast eater.   Estimated daily fluid intake: 48 oz Supplements: Folic Acid, Calcium, Probiotic Sleep: Sleeps well, occasionally has trouble falling asleep Stress / self-care: Low Current average weekly physical activity: ADLs   24-Hr Dietary Recall First Meal: toast (whole wheat) w/ peanut butter Snack: None Second Meal: skip Snack: None Third Meal: fish, and 5 shrimp Snack:  Beverages: Coffee, water (plain)   NUTRITION DIAGNOSIS  NB-1.1 Food and nutrition-related knowledge deficit As related to GERD.  As evidenced by history of reflux, and severe symptoms when eating fatty foods and late at night.   NUTRITION INTERVENTION  Nutrition education (E-1) on the following topics:   Educated pt on eating throughout the day Educated pt on nutrition facts label Educated pt on complex vs simple carbohydrates  Educated pt on plant based fats and oils vs saturated animal fats  Why you need complex  carbohydrates: Whole grains and other complex carbohydrates are required to have a healthy diet. Whole grains provide fiber which can help with blood glucose levels and help keep you satiated. Fruits and starchy vegetables provide essential vitamins and minerals required for immune function, eyesight support, brain support, bone density, wound healing and many other functions within the body. According to the current evidenced based 2020-2025 Dietary Guidelines for Americans, complex carbohydrates are part of a healthy eating pattern which is associated with a decreased risk for type 2 diabetes,  cancers, and cardiovascular disease.    Handouts Provided Include  MyPlate handout  Learning Style & Readiness for Change Teaching method utilized: Visual & Auditory  Demonstrated degree of understanding via: Teach Back  Barriers to learning/adherence to lifestyle change: Resistance to Change  Goals Established by Pt  Work on starting each day with a balanced breakfast of a starch and protein. Try fat-free greek yogurt and fruit, or an english muffin with eggs. Work towards eating three meals a day, about 5-6 hours apart!  MONITORING & EVALUATION Dietary intake, weekly physical activity, and reflux in 1 month.  Next Steps  Patient is to follow up with RD.

## 2021-06-07 ENCOUNTER — Encounter: Payer: Self-pay | Admitting: Skilled Nursing Facility1

## 2021-06-12 ENCOUNTER — Ambulatory Visit: Payer: 59 | Admitting: Dietician

## 2021-06-12 DIAGNOSIS — D2272 Melanocytic nevi of left lower limb, including hip: Secondary | ICD-10-CM | POA: Diagnosis not present

## 2021-06-12 DIAGNOSIS — L821 Other seborrheic keratosis: Secondary | ICD-10-CM | POA: Diagnosis not present

## 2021-06-12 DIAGNOSIS — D2362 Other benign neoplasm of skin of left upper limb, including shoulder: Secondary | ICD-10-CM | POA: Diagnosis not present

## 2021-06-12 DIAGNOSIS — D2271 Melanocytic nevi of right lower limb, including hip: Secondary | ICD-10-CM | POA: Diagnosis not present

## 2021-06-12 DIAGNOSIS — Z85828 Personal history of other malignant neoplasm of skin: Secondary | ICD-10-CM | POA: Diagnosis not present

## 2021-06-12 DIAGNOSIS — D1801 Hemangioma of skin and subcutaneous tissue: Secondary | ICD-10-CM | POA: Diagnosis not present

## 2021-06-12 DIAGNOSIS — D225 Melanocytic nevi of trunk: Secondary | ICD-10-CM | POA: Diagnosis not present

## 2021-06-18 ENCOUNTER — Other Ambulatory Visit (HOSPITAL_COMMUNITY): Payer: Self-pay

## 2021-06-19 ENCOUNTER — Other Ambulatory Visit (HOSPITAL_COMMUNITY): Payer: Self-pay

## 2021-06-19 MED ORDER — CELECOXIB 200 MG PO CAPS
200.0000 mg | ORAL_CAPSULE | Freq: Every day | ORAL | 2 refills | Status: DC
  Filled 2021-06-19 – 2021-06-26 (×3): qty 30, 30d supply, fill #0
  Filled 2021-07-28: qty 30, 30d supply, fill #1
  Filled 2021-08-27: qty 30, 30d supply, fill #2
  Filled ????-??-??: fill #0

## 2021-06-19 MED ORDER — PREDNISONE 5 MG PO TABS
5.0000 mg | ORAL_TABLET | Freq: Every day | ORAL | 0 refills | Status: DC
  Filled 2021-06-19: qty 90, 90d supply, fill #0

## 2021-06-20 ENCOUNTER — Other Ambulatory Visit (HOSPITAL_COMMUNITY): Payer: Self-pay

## 2021-06-21 ENCOUNTER — Other Ambulatory Visit (HOSPITAL_COMMUNITY): Payer: Self-pay

## 2021-06-26 ENCOUNTER — Other Ambulatory Visit (HOSPITAL_COMMUNITY): Payer: Self-pay

## 2021-07-02 ENCOUNTER — Other Ambulatory Visit (HOSPITAL_COMMUNITY): Payer: Self-pay

## 2021-07-05 ENCOUNTER — Other Ambulatory Visit (HOSPITAL_COMMUNITY): Payer: Self-pay

## 2021-07-05 ENCOUNTER — Ambulatory Visit: Payer: 59 | Admitting: Dietician

## 2021-07-05 DIAGNOSIS — R0989 Other specified symptoms and signs involving the circulatory and respiratory systems: Secondary | ICD-10-CM | POA: Diagnosis not present

## 2021-07-05 DIAGNOSIS — R053 Chronic cough: Secondary | ICD-10-CM | POA: Diagnosis not present

## 2021-07-05 DIAGNOSIS — K219 Gastro-esophageal reflux disease without esophagitis: Secondary | ICD-10-CM | POA: Diagnosis not present

## 2021-07-05 MED ORDER — SUCRALFATE 1 G PO TABS
1.0000 g | ORAL_TABLET | Freq: Two times a day (BID) | ORAL | 0 refills | Status: DC
  Filled 2021-07-05: qty 60, 30d supply, fill #0

## 2021-07-10 ENCOUNTER — Ambulatory Visit: Payer: 59 | Attending: Family Medicine | Admitting: Pharmacist

## 2021-07-10 DIAGNOSIS — Z79899 Other long term (current) drug therapy: Secondary | ICD-10-CM

## 2021-07-11 ENCOUNTER — Other Ambulatory Visit (HOSPITAL_COMMUNITY): Payer: Self-pay

## 2021-07-12 ENCOUNTER — Other Ambulatory Visit: Payer: Self-pay | Admitting: Pharmacist

## 2021-07-12 ENCOUNTER — Other Ambulatory Visit (HOSPITAL_COMMUNITY): Payer: Self-pay

## 2021-07-12 MED ORDER — PROLIA 60 MG/ML ~~LOC~~ SOSY: PREFILLED_SYRINGE | SUBCUTANEOUS | 1 refills | Status: DC

## 2021-07-12 MED ORDER — PROLIA 60 MG/ML ~~LOC~~ SOSY
PREFILLED_SYRINGE | SUBCUTANEOUS | 1 refills | Status: DC
  Filled 2021-07-12: qty 1, 180d supply, fill #0

## 2021-07-18 DIAGNOSIS — R197 Diarrhea, unspecified: Secondary | ICD-10-CM | POA: Diagnosis not present

## 2021-07-19 ENCOUNTER — Other Ambulatory Visit (HOSPITAL_COMMUNITY): Payer: Self-pay

## 2021-07-20 ENCOUNTER — Other Ambulatory Visit (HOSPITAL_COMMUNITY): Payer: Self-pay

## 2021-07-23 ENCOUNTER — Other Ambulatory Visit (HOSPITAL_COMMUNITY): Payer: Self-pay

## 2021-07-23 MED ORDER — PROLIA 60 MG/ML ~~LOC~~ SOSY: PREFILLED_SYRINGE | SUBCUTANEOUS | 1 refills | Status: DC

## 2021-07-24 ENCOUNTER — Other Ambulatory Visit (HOSPITAL_COMMUNITY): Payer: Self-pay

## 2021-07-24 MED ORDER — KEVZARA 200 MG/1.14ML ~~LOC~~ SOSY
PREFILLED_SYRINGE | SUBCUTANEOUS | 2 refills | Status: DC
  Filled 2021-07-24: qty 2.28, 28d supply, fill #0
  Filled 2021-08-21: qty 2.28, 28d supply, fill #1
  Filled 2021-09-26: qty 2.28, 28d supply, fill #2

## 2021-07-30 ENCOUNTER — Other Ambulatory Visit (HOSPITAL_COMMUNITY): Payer: Self-pay

## 2021-07-30 DIAGNOSIS — E669 Obesity, unspecified: Secondary | ICD-10-CM | POA: Diagnosis not present

## 2021-07-30 DIAGNOSIS — Z9225 Personal history of immunosupression therapy: Secondary | ICD-10-CM | POA: Diagnosis not present

## 2021-07-30 DIAGNOSIS — M0580 Other rheumatoid arthritis with rheumatoid factor of unspecified site: Secondary | ICD-10-CM | POA: Diagnosis not present

## 2021-07-30 DIAGNOSIS — K449 Diaphragmatic hernia without obstruction or gangrene: Secondary | ICD-10-CM | POA: Diagnosis not present

## 2021-07-30 DIAGNOSIS — R0989 Other specified symptoms and signs involving the circulatory and respiratory systems: Secondary | ICD-10-CM | POA: Diagnosis not present

## 2021-07-30 DIAGNOSIS — K219 Gastro-esophageal reflux disease without esophagitis: Secondary | ICD-10-CM | POA: Diagnosis not present

## 2021-07-30 DIAGNOSIS — E042 Nontoxic multinodular goiter: Secondary | ICD-10-CM | POA: Diagnosis not present

## 2021-07-30 DIAGNOSIS — R1310 Dysphagia, unspecified: Secondary | ICD-10-CM | POA: Diagnosis not present

## 2021-07-31 ENCOUNTER — Other Ambulatory Visit (HOSPITAL_COMMUNITY): Payer: Self-pay

## 2021-08-02 ENCOUNTER — Other Ambulatory Visit: Payer: Self-pay

## 2021-08-02 ENCOUNTER — Other Ambulatory Visit: Payer: Self-pay | Admitting: Gastroenterology

## 2021-08-02 ENCOUNTER — Other Ambulatory Visit (HOSPITAL_COMMUNITY): Payer: Self-pay

## 2021-08-02 DIAGNOSIS — K219 Gastro-esophageal reflux disease without esophagitis: Secondary | ICD-10-CM

## 2021-08-02 DIAGNOSIS — R053 Chronic cough: Secondary | ICD-10-CM

## 2021-08-06 ENCOUNTER — Other Ambulatory Visit (HOSPITAL_COMMUNITY): Payer: Self-pay

## 2021-08-06 DIAGNOSIS — R0989 Other specified symptoms and signs involving the circulatory and respiratory systems: Secondary | ICD-10-CM | POA: Diagnosis not present

## 2021-08-06 DIAGNOSIS — K219 Gastro-esophageal reflux disease without esophagitis: Secondary | ICD-10-CM | POA: Diagnosis not present

## 2021-08-06 DIAGNOSIS — R053 Chronic cough: Secondary | ICD-10-CM | POA: Diagnosis not present

## 2021-08-09 ENCOUNTER — Other Ambulatory Visit (HOSPITAL_COMMUNITY): Payer: Self-pay

## 2021-08-09 DIAGNOSIS — R1310 Dysphagia, unspecified: Secondary | ICD-10-CM

## 2021-08-16 ENCOUNTER — Other Ambulatory Visit (HOSPITAL_COMMUNITY): Payer: Self-pay

## 2021-08-16 ENCOUNTER — Other Ambulatory Visit (HOSPITAL_COMMUNITY): Payer: 59

## 2021-08-17 DIAGNOSIS — M8000XA Age-related osteoporosis with current pathological fracture, unspecified site, initial encounter for fracture: Secondary | ICD-10-CM | POA: Diagnosis not present

## 2021-08-17 DIAGNOSIS — E049 Nontoxic goiter, unspecified: Secondary | ICD-10-CM | POA: Diagnosis not present

## 2021-08-17 DIAGNOSIS — Z79899 Other long term (current) drug therapy: Secondary | ICD-10-CM | POA: Diagnosis not present

## 2021-08-17 DIAGNOSIS — Z0000 Encounter for general adult medical examination without abnormal findings: Secondary | ICD-10-CM | POA: Diagnosis not present

## 2021-08-17 DIAGNOSIS — M810 Age-related osteoporosis without current pathological fracture: Secondary | ICD-10-CM | POA: Diagnosis not present

## 2021-08-17 DIAGNOSIS — M069 Rheumatoid arthritis, unspecified: Secondary | ICD-10-CM | POA: Diagnosis not present

## 2021-08-17 DIAGNOSIS — I700 Atherosclerosis of aorta: Secondary | ICD-10-CM | POA: Diagnosis not present

## 2021-08-17 DIAGNOSIS — I10 Essential (primary) hypertension: Secondary | ICD-10-CM | POA: Diagnosis not present

## 2021-08-17 DIAGNOSIS — K219 Gastro-esophageal reflux disease without esophagitis: Secondary | ICD-10-CM | POA: Diagnosis not present

## 2021-08-20 ENCOUNTER — Ambulatory Visit (HOSPITAL_COMMUNITY)
Admission: RE | Admit: 2021-08-20 | Discharge: 2021-08-20 | Disposition: A | Discharge status: Home / Self Care | Payer: PPO | Source: Ambulatory Visit | Attending: Family Medicine | Admitting: Family Medicine

## 2021-08-20 ENCOUNTER — Ambulatory Visit (HOSPITAL_COMMUNITY)
Admission: RE | Admit: 2021-08-20 | Discharge: 2021-08-20 | Disposition: A | Discharge status: Home / Self Care | Payer: PPO | Source: Ambulatory Visit | Attending: Gastroenterology | Admitting: Gastroenterology

## 2021-08-20 DIAGNOSIS — R053 Chronic cough: Secondary | ICD-10-CM | POA: Insufficient documentation

## 2021-08-20 DIAGNOSIS — R0989 Other specified symptoms and signs involving the circulatory and respiratory systems: Secondary | ICD-10-CM | POA: Insufficient documentation

## 2021-08-20 DIAGNOSIS — K219 Gastro-esophageal reflux disease without esophagitis: Secondary | ICD-10-CM | POA: Insufficient documentation

## 2021-08-20 DIAGNOSIS — R1310 Dysphagia, unspecified: Secondary | ICD-10-CM | POA: Insufficient documentation

## 2021-08-20 NOTE — Progress Notes (Signed)
Modified Barium Swallow Progress Note  Patient Details  Name: Olivia Rubio MRN: [id number] Date of Birth: 07-19-1955  Today's Date: 08/20/2021  Modified Barium Swallow completed.  Full report located under Chart Review in the Imaging Section.  Brief recommendations include the following:  Clinical Impression  When therapist entered room pt was clearing her throat frequently (approximately every 5-10 seconds for a period during study). She stated she has throat cleared or coughed frequently for years. Her oropharyngeal swallow integrity was intact across consistencies of thin (with/without straw) barium, solid and barium pill. Laryngeal protection within functional limits without aspiration or residue present. Barium pill transited into UES and through GE junction without apparent difficulty (MBS doesn't diagnose below the UES). Results discussed with pt and educated her re: cough/throat clear supperssion strategies via verbal and written means (handout). She may benefit from therapy at outpatient ([facility] in particular has SLP who may specialize in vocal cord/cough supperssion management) to facilitate behavioral approach to symptoms in addition to what medical management. Continue regular texture, thin liquids and strategies for esophageal involvement and cough supperssion.   Swallow Evaluation Recommendations   Recommended Consults:  (outpatient ST for cough/throat clear supperssion)   SLP Diet Recommendations: Regular solids;Thin liquid   Liquid Administration via: Cup;Straw   Medication Administration: Whole meds with liquid   Supervision: Patient able to self feed   Compensations: Small sips/bites;Slow rate   Postural Changes: Seated upright at 90 degrees   Oral Care Recommendations: Oral care BID        Houston Siren 08/20/2021,1:22 PM

## 2021-08-21 ENCOUNTER — Other Ambulatory Visit (HOSPITAL_COMMUNITY): Payer: Self-pay

## 2021-08-22 ENCOUNTER — Other Ambulatory Visit (HOSPITAL_COMMUNITY): Payer: Self-pay

## 2021-08-22 DIAGNOSIS — G6289 Other specified polyneuropathies: Secondary | ICD-10-CM | POA: Diagnosis not present

## 2021-08-22 DIAGNOSIS — M545 Low back pain, unspecified: Secondary | ICD-10-CM | POA: Diagnosis not present

## 2021-08-22 DIAGNOSIS — Z79899 Other long term (current) drug therapy: Secondary | ICD-10-CM | POA: Diagnosis not present

## 2021-08-22 DIAGNOSIS — E669 Obesity, unspecified: Secondary | ICD-10-CM | POA: Diagnosis not present

## 2021-08-22 DIAGNOSIS — M8080XA Other osteoporosis with current pathological fracture, unspecified site, initial encounter for fracture: Secondary | ICD-10-CM | POA: Diagnosis not present

## 2021-08-22 DIAGNOSIS — M1991 Primary osteoarthritis, unspecified site: Secondary | ICD-10-CM | POA: Diagnosis not present

## 2021-08-22 DIAGNOSIS — Z6833 Body mass index (BMI) 33.0-33.9, adult: Secondary | ICD-10-CM | POA: Diagnosis not present

## 2021-08-22 DIAGNOSIS — M0589 Other rheumatoid arthritis with rheumatoid factor of multiple sites: Secondary | ICD-10-CM | POA: Diagnosis not present

## 2021-08-22 DIAGNOSIS — M25512 Pain in left shoulder: Secondary | ICD-10-CM | POA: Diagnosis not present

## 2021-08-22 DIAGNOSIS — M542 Cervicalgia: Secondary | ICD-10-CM | POA: Diagnosis not present

## 2021-08-24 ENCOUNTER — Ambulatory Visit (HOSPITAL_COMMUNITY): Payer: PPO

## 2021-08-24 ENCOUNTER — Encounter (HOSPITAL_COMMUNITY): Payer: Self-pay

## 2021-08-27 ENCOUNTER — Other Ambulatory Visit (HOSPITAL_COMMUNITY): Payer: Self-pay

## 2021-08-30 ENCOUNTER — Other Ambulatory Visit (HOSPITAL_COMMUNITY): Payer: Self-pay

## 2021-09-14 ENCOUNTER — Other Ambulatory Visit (HOSPITAL_COMMUNITY): Payer: Self-pay

## 2021-09-17 ENCOUNTER — Other Ambulatory Visit (HOSPITAL_COMMUNITY): Payer: Self-pay

## 2021-09-18 ENCOUNTER — Other Ambulatory Visit (HOSPITAL_COMMUNITY): Payer: Self-pay

## 2021-09-18 MED ORDER — PREDNISONE 5 MG PO TABS
5.0000 mg | ORAL_TABLET | Freq: Every day | ORAL | 0 refills | Status: DC
  Filled 2021-09-18: qty 90, 90d supply, fill #0

## 2021-09-19 ENCOUNTER — Other Ambulatory Visit (HOSPITAL_COMMUNITY): Payer: Self-pay

## 2021-09-24 ENCOUNTER — Other Ambulatory Visit (HOSPITAL_COMMUNITY): Payer: Self-pay

## 2021-09-26 ENCOUNTER — Other Ambulatory Visit (HOSPITAL_COMMUNITY): Payer: Self-pay

## 2021-10-02 ENCOUNTER — Ambulatory Visit: Payer: PPO

## 2021-10-08 ENCOUNTER — Other Ambulatory Visit: Payer: Self-pay

## 2021-10-08 ENCOUNTER — Ambulatory Visit: Payer: PPO | Attending: Gastroenterology

## 2021-10-08 DIAGNOSIS — R053 Chronic cough: Secondary | ICD-10-CM | POA: Diagnosis not present

## 2021-10-08 DIAGNOSIS — R490 Dysphonia: Secondary | ICD-10-CM | POA: Diagnosis not present

## 2021-10-08 DIAGNOSIS — K219 Gastro-esophageal reflux disease without esophagitis: Secondary | ICD-10-CM | POA: Diagnosis not present

## 2021-10-08 DIAGNOSIS — R0989 Other specified symptoms and signs involving the circulatory and respiratory systems: Secondary | ICD-10-CM | POA: Diagnosis not present

## 2021-10-08 NOTE — Therapy (Signed)
OUTPATIENT SPEECH LANGUAGE PATHOLOGY VOICE EVALUATION   Patient Name: Olivia Rubio MRN: [id number] DOB:1955/06/23, 66 y.o., female Today's Date: 10/08/2021  PCP: Maurice Small, MD REFERRING PROVIDER: Angelique Holm, PA-C    End of Session - 10/08/21 1127     Visit Number 1    Number of Visits 11    Date for SLP Re-Evaluation 12/17/21    SLP Start Time 0805    SLP Stop Time  0848    SLP Time Calculation (min) 43 min    Activity Tolerance Patient tolerated treatment well             Past Medical History:  Diagnosis Date   Acid reflux    Arthritis    Baker's cyst of knee    Change in voice    Cough    Endometriosis    H/O: hysterectomy    Hypothyroidism    Nasal congestion    Osteoporosis    Thyroid nodule    Past Surgical History:  Procedure Laterality Date   [place]  2008   Patient Active Problem List   Diagnosis Date Noted   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 Nothing helps." Pt stated she also tried Gabapentin for >30 days after pulmonologist appt in 2018 with little assistance, read things online about Gabapentin and then stopped taking the med. Pt accompanied by: self  PERTINENT HISTORY: Pt PMH; GERD, hiatal hernia, RA.   PAIN:  Are you having pain? No   FALLS: Has patient fallen in  last 6 months? No,   LIVING ENVIRONMENT: Lives with: lives with their spouse Lives in: House/apartment  PLOF: Independent  PATIENT GOALS Reduce frequency of throat clearing  OBJECTIVE:   DIAGNOSTIC FINDINGS:  MBS 08-20-21: Her oropharyngeal swallow integrity was intact across consistencies of thin (with/without straw) barium, solid and barium pill. Laryngeal protection within functional limits without aspiration or residue present. Barium pill transited into UES and through GE junction without apparent difficulty (MBS doesn't diagnose below the UES). Results discussed with pt and educated her re: cough/throat clear supperssion strategies via verbal and written means (handout). She may benefit from therapy at outpatient ([facility] in particular has SLP who may specialize in vocal cord/cough supperssion management) to facilitate behavioral approach to symptoms in addition to what medical management. Continue regular texture, thin liquids and strategies for esophageal involvement and cough supperssion.  DG Esophagus November 2022:  MPRESSION: 1. Normal esophageal motility. 2. Small sliding-type hiatal hernia and inducible GE reflux. 3. No esophageal mass or stricture.  COGNITION: Overall cognitive status: Within functional limits for tasks assessed  SOCIAL HISTORY: Occupation: Not working Water intake: < 40 oz/day Caffeine/alcohol intake:  none in last 6-8 weeks . Pt reports that decr in caffeine "has knocked (symptoms) down a lot but it hasn't helped compeletely." Daily voice use: minimal  PERCEPTUAL VOICE ASSESSMENT: Voice quality:  hoarse (mild), dry Vocal abuse: habitual throat clearing and abnormal breathing pattern. Pt cleared her throat 51 times in 5 minutes. Resonance: normal Respiratory function: thoracic breathing and clavicular breathing  OBJECTIVE VOICE ASSESSMENT: Maximum phonation time for sustained "ah": 15.1 Conversational pitch average: 198 Hz Conversational pitch  range: 119 - 400 Hz Conversational loudness average: upper 60s -low 70s dB Conversational loudness range: 65-78 dB S/z ratio: 1.3 (Suggestive of dysfunction >1.0)  PATIENT REPORTED OUTCOME MEASURES (PROM): VCDQ Vickki Muff, SLenna Sciara., etal. (2015)- pt scored herself 43/60 (lower scores indicate better QOL , when considering symptoms of vocal cord dysfunction (VCD)). She scored "agree strongly" with "my sx are confined to my upper chest", "I feel like there is something in my throat that I cannot clear", and "I am frustrated that my sx have not been understood correctly." She scored "agree" with "my attacks typically come on very suddenly", "my attacks are associated with changes in my voice", and "the attacks impact on my social life".   TODAY'S TREATMENT:  SLP discussed results of exam and the framework for therapy going forward. SLP suggested pt contact her PCP or her GI MD to discuss the feasibility of re-starting regimen of Gabapentin given her current symptoms reportedly appear to be increasing/more frequent and more severe (now pt has had throat closing sx in three straight months from April to June 2023).   PATIENT EDUCATION: Education details: see above in "today's treatment" Person educated: Patient Education method: Customer service manager Education comprehension: verbalized understanding   HOME EXERCISE PROGRAM: Not started yet. Will begin after first therapy session     GOALS: Goals reviewed with patient? Yes  SHORT TERM GOALS: Target date: 11/05/2021    Pt will demo abdominal breathing (AB) at rest 80% of the time for 5 minutes in 2 sessions Baseline: Goal status: INITIAL  2.  Pt will demo abdominal breathing (AB) 70% with answers to simple questions in 2 sessions Baseline:  Goal status: INITIAL  3.  Pt will decr throat clearing to <20 in 5 minutes Baseline:  Goal status: INITIAL  4.  Pt will tell SLP benefits of throat clearing/coughing  alternatives  Baseline:  Goal Status: Initial    LONG TERM GOALS: Target date: 12/03/2021    Pt will demo AB 70% in 5 minutes simple/mod complex conversation in 2 sessions Baseline:  Goal status: INITIAL  2.  Pt will decr throat clearing to <15 in 5 minutes Baseline:  Goal status: INITIAL  3.  Pt will tell SLP the possible benefits of pharmacologic intervention for chronic cough Baseline:  Goal status: INITIAL  4.  Andrika will score 30/60 or lower on PROM the last 1-2 weeks of therapy Baseline:  Goal status: INITIAL   ASSESSMENT:  CLINICAL IMPRESSION: Patient is a 66 y.o. female who was seen today for significant chronic throat clear/cough and mild hoarseness. SLP believes pt's hoarseness will improve if pt decreases her frequency of throat clear/cough. Today she cleared her throat 51 times in 5 minutes. Chelan reports to SLP that even mild/minor sensory gustatory/PO stimuli will elicit throat clear/cough such as sweetness (I.e., frosting), roughness (I.e., cold cereal in milk), and temperature (I.e., ice cream). She tells SLP that once/month from April to June she also experienced wheezing ("It felt like my throat closed off") which was the reason for again seeking medical assistance for her chronic cough. SLP suggested pt talk with MD about pharmacological therapy for chronic cough in addition to skilled ST focusing on abdominal breathing and vocal  hygiene in order to decr frequency of her throat clear/cough.     OBJECTIVE IMPAIRMENTS include voice disorder. These impairments are limiting patient from household responsibilities, ADLs/IADLs, and effectively communicating at home and in community. Factors affecting potential to achieve goals and functional outcome are cooperation/participation level and severity of impairments.. Patient will benefit from skilled SLP services to address above impairments and improve overall function.  REHAB POTENTIAL: Fair due to time post onset and  pt's reluctance to pharmacological alternatives to decr/eliminate chronic throat clear/cough  PLAN: SLP FREQUENCY: 2x/week  SLP DURATION: 8 weeks  PLANNED INTERVENTIONS: Environmental controls, SLP instruction and feedback, Compensatory strategies, Patient/family education, and abdominal breathing education/practice    Lenix Kidd, CCC-SLP 10/08/2021, 11:28 AM

## 2021-10-10 ENCOUNTER — Ambulatory Visit: Payer: PPO

## 2021-10-10 DIAGNOSIS — R053 Chronic cough: Secondary | ICD-10-CM

## 2021-10-10 DIAGNOSIS — R490 Dysphonia: Secondary | ICD-10-CM

## 2021-10-10 NOTE — Therapy (Signed)
OUTPATIENT SPEECH LANGUAGE PATHOLOGY VOICE TREATMENT   Patient Name: Olivia Rubio MRN: [id number] DOB:11-May-1955, 66 y.o., female Today's Date: 10/10/2021  PCP: Maurice Small, MD REFERRING PROVIDER: Angelique Holm, PA-C    End of Session - 10/10/21 1102     Visit Number 2    Number of Visits 11    Date for SLP Re-Evaluation 12/17/21    SLP Start Time 0933    SLP Stop Time  1015    SLP Time Calculation (min) 42 min    Activity Tolerance Patient tolerated treatment well              Past Medical History:  Diagnosis Date   Acid reflux    Arthritis    Baker's cyst of knee    Change in voice    Cough    Endometriosis    H/O: hysterectomy    Hypothyroidism    Nasal congestion    Osteoporosis    Thyroid nodule    Past Surgical History:  Procedure Laterality Date   [place]  2008   Patient Active Problem List   Diagnosis Date Noted   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 10/27/2020   DOE (dyspnea on exertion) 09/30/2016   Upper airway cough syndrome 08/29/2016   Ankle fracture, right, closed, initial encounter 07/16/2016   Multinodular goiter (nontoxic), dominant left thyroid nodule 11/12/2010   SINUSITIS 07/09/2006    Onset date: "15-20 years ago"  REFERRING DIAG: R05.3 (ICD-10-CM) - Persistent cough   THERAPY DIAG:  Chronic cough  Voice hoarseness  Rationale for Evaluation and Treatment Rehabilitation  SUBJECTIVE:   SUBJECTIVE STATEMENT: "I'm a bit skeptical (if ST is going to help)." Pt accompanied by: self  PERTINENT HISTORY: Pt PMH; GERD, hiatal hernia, RA.   PAIN:  Are you having pain? No  PATIENT GOALS Reduce frequency of throat clearing  OBJECTIVE:   TODAY'S TREATMENT:  10/10/21: SLP began with abdominal breathing (AB) education. Pt with 17 throat clears in 5 minutes working with AB, which is a drastic reduction compared to her evaluation.  Pt attributes this to not eating yet this morning - SLP postulates this is possible, but a drastic reduction like that has much to do with getting tension/focus off of throat area. After 9 minutes of focus and conversation about AB, throat clears/coughing frequency slowly decr'd in frequency and counted as 33. SLP then educated pt about throat clearing alternatives and got pt to practice each one. With this 13 minute conversation, throat clearing/coughing gradually increased in frequency over time to 65 total during this time. Pt did not spontaneously use throat clearing alternatives even though SLP assured pt could perform each correctly. SLP strongly encouraged pt to engage in throat clearing alternatives and reminded her how vocally abusive extensive throat clearing can be and also produces additional mucous. Pt demonstrated understanding.  10/08/21: SLP discussed results of exam and the framework for therapy going forward. SLP suggested pt contact her PCP or her GI MD to discuss the feasibility of re-starting regimen of Gabapentin given her current symptoms reportedly appear to be increasing/more frequent and more severe (now pt has had throat closing sx in three straight months from April to June 2023).   PATIENT EDUCATION: Education details: see above in "today's treatment" Person educated: Patient Education method: Customer service manager Education comprehension: verbalized understanding   HOME EXERCISE PROGRAM: Not started yet. Will begin after first therapy  session     GOALS: Goals reviewed with patient? Yes  SHORT TERM GOALS: Target date: 11/05/2021    Pt will demo abdominal breathing (AB) at rest 80% of the time for 5 minutes in 2 sessions Baseline: Goal status: Ongoing  2.  Pt will demo abdominal breathing (AB) 70% with answers to simple questions in 2 sessions Baseline:  Goal status: Ongoing  3.  Pt will decr throat clearing to <20 in 5 minutes Baseline:  Goal  status: Ongoing  4.  Pt will tell SLP benefits of throat clearing/coughing alternatives  Baseline:  Goal Status: Ongoing    LONG TERM GOALS: Target date: 12/03/2021    Pt will demo AB 70% in 5 minutes simple/mod complex conversation in 2 sessions Baseline:  Goal status: Ongoing  2.  Pt will decr throat clearing to <15 in 5 minutes Baseline:  Goal status: Ongoing  3.  Pt will tell SLP the possible benefits of pharmacologic intervention for chronic cough Baseline:  Goal status: Ongoing  4.  [name] will score 30/60 or lower on PROM the last 1-2 weeks of therapy Baseline:  Goal status: Ongoing   ASSESSMENT:  CLINICAL IMPRESSION: Patient is a 66 y.o. female who was seen today for treatment of chronic throat clear/cough and mild hoarseness. SLP believes pt's hoarseness will improve if pt decreases her frequency of throat clear/cough. SEE TX NOTE FOR MORE DETAILS. She tells SLP that once/month from April to June she also experienced wheezing ("It felt like my throat closed off") which was the reason for again seeking medical assistance for her chronic cough. SLP suggested pt talk with MD about pharmacological therapy for chronic cough in addition to skilled ST focusing on abdominal breathing and vocal hygiene in order to decr frequency of her throat clear/cough.     OBJECTIVE IMPAIRMENTS include voice disorder. These impairments are limiting patient from household responsibilities, ADLs/IADLs, and effectively communicating at home and in community. Factors affecting potential to achieve goals and functional outcome are cooperation/participation level and severity of impairments.. Patient will benefit from skilled SLP services to address above impairments and improve overall function.  REHAB POTENTIAL: Fair due to time post onset and pt's reluctance to pharmacological alternatives to decr/eliminate chronic throat clear/cough  PLAN: SLP FREQUENCY: 2x/week  SLP DURATION: 8  weeks  PLANNED INTERVENTIONS: Environmental controls, SLP instruction and feedback, Compensatory strategies, Patient/family education, and abdominal breathing education/practice    Zariana Strub, CCC-SLP 10/10/2021, 11:03 AM

## 2021-10-17 ENCOUNTER — Other Ambulatory Visit (HOSPITAL_COMMUNITY): Payer: Self-pay

## 2021-10-18 ENCOUNTER — Other Ambulatory Visit (HOSPITAL_COMMUNITY): Payer: Self-pay

## 2021-10-18 MED ORDER — KEVZARA 200 MG/1.14ML ~~LOC~~ SOSY
PREFILLED_SYRINGE | SUBCUTANEOUS | 5 refills | Status: AC
  Filled 2021-10-18: qty 2.28, 28d supply, fill #0
  Filled 2021-11-15: qty 2.28, 28d supply, fill #1
  Filled 2021-12-13: qty 2.28, 28d supply, fill #2
  Filled 2022-01-15: qty 2.28, 28d supply, fill #3

## 2021-10-22 ENCOUNTER — Ambulatory Visit: Payer: PPO | Attending: Gastroenterology

## 2021-10-22 DIAGNOSIS — R1310 Dysphagia, unspecified: Secondary | ICD-10-CM

## 2021-10-22 DIAGNOSIS — R490 Dysphonia: Secondary | ICD-10-CM

## 2021-10-22 DIAGNOSIS — R053 Chronic cough: Secondary | ICD-10-CM | POA: Diagnosis not present

## 2021-10-22 NOTE — Therapy (Signed)
OUTPATIENT SPEECH LANGUAGE PATHOLOGY VOICE TREATMENT   Patient Name: Olivia Rubio MRN: [id number] DOB:1955-05-31, 66 y.o., female Today's Date: 10/22/2021  PCP: Maurice Small, MD REFERRING PROVIDER: Angelique Holm, PA-C    End of Session - 10/22/21 1224     Visit Number 3    Number of Visits 11    Date for SLP Re-Evaluation 12/17/21    SLP Start Time 1107    SLP Stop Time  1147    SLP Time Calculation (min) 40 min    Activity Tolerance Patient tolerated treatment well               Past Medical History:  Diagnosis Date   Acid reflux    Arthritis    Baker's cyst of knee    Change in voice    Cough    Endometriosis    H/O: hysterectomy    Hypothyroidism    Nasal congestion    Osteoporosis    Thyroid nodule    Past Surgical History:  Procedure Laterality Date   [place]  2008   Patient Active Problem List   Diagnosis Date Noted   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 10/27/2020   DOE (dyspnea on exertion) 09/30/2016   Upper airway cough syndrome 08/29/2016   Ankle fracture, right, closed, initial encounter 07/16/2016   Multinodular goiter (nontoxic), dominant left thyroid nodule 11/12/2010   SINUSITIS 07/09/2006    Onset date: "15-20 years ago"  REFERRING DIAG: R05.3 (ICD-10-CM) - Persistent cough   THERAPY DIAG:  Chronic cough  Voice hoarseness  Dysphagia, unspecified type  Rationale for Evaluation and Treatment Rehabilitation  SUBJECTIVE:   SUBJECTIVE STATEMENT: "I was dizzy when I did the breathing - I could only do it about 5 minutes." Pt accompanied by: self  PERTINENT HISTORY: Pt PMH; GERD, hiatal hernia, RA.   PAIN:  Are you having pain? No  PATIENT GOALS Reduce frequency of throat clearing  OBJECTIVE:   TODAY'S TREATMENT:  10/22/21: "You can't do those (throat clear compensations) around other people or you look like a nut." Pt has  been performing hard "h" sound when she is not around others. The "scolding mmm" is too much like throat clearing and pt cannot perform it without doing a throat clear. The water as an alternative seems to facilitate more throat clearing (SLP wonders if temperature or sensation is part of the hypersensitivity - pt states colder or warmer things does not appear to change frequency of clearing). Pt cont to say sweets (liquids and solids), and riding in a car triggers the incr in throat clearing; yesterday she did very little throat clearing and did not ride in a car. During this conversation of 3 minutes pt cleared 21 times. Pt stated she feels dizzy if she has >5 minutes of AB; When SLP had pt produce what she was doing at home she was holding breath for approx 1 second - SLP worked with pt to correct this. Afterwards pt and SLP worked with abdominal breathing (AB) in seated position in isolation. Pt with 95% success with AB, and 4 throat clears in 16 minutes. The next 4 minutes pt cleared her throat 11 times. SLP drew this to pt's attention including the 21 clears in 3 minutes and she stated that was a notable difference. SLP STRONGLY encouraged pt to practice AB at least 10 minutes, 2-3 times a day until next session.  Lastly  pt noted tension in her throat during and after AB. SLP talked with pt about what to think of when pt notes she is going to have a coughing episode (AB, sniff-blow, relaxation strategies for larynx/pharynx). SLP practiced sniff-blow with pt - reluctant to do technique until SLP assured her it was socially acceptable.   10/10/21: SLP began with abdominal breathing (AB) education. Pt with 17 throat clears in 5 minutes working with AB, which is a drastic reduction compared to her evaluation. Pt attributes this to not eating yet this morning - SLP postulates this is possible, but a drastic reduction like that has much to do with getting tension/focus off of throat area. After 9 minutes of focus  and conversation about AB, throat clears/coughing frequency slowly decr'd in frequency and counted as 33. SLP then educated pt about throat clearing alternatives and got pt to practice each one. With this 13 minute conversation, throat clearing/coughing gradually increased in frequency over time to 65 total during this time. Pt did not spontaneously use throat clearing alternatives even though SLP assured pt could perform each correctly. SLP strongly encouraged pt to engage in throat clearing alternatives and reminded her how vocally abusive extensive throat clearing can be and also produces additional mucous. Pt demonstrated understanding.  10/08/21: SLP discussed results of exam and the framework for therapy going forward. SLP suggested pt contact her PCP or her GI MD to discuss the feasibility of re-starting regimen of Gabapentin given her current symptoms reportedly appear to be increasing/more frequent and more severe (now pt has had throat closing sx in three straight months from April to June 2023).   PATIENT EDUCATION: Education details: see above in "today's treatment" Person educated: Patient Education method: Customer service manager Education comprehension: verbalized understanding, return demo   [place]: Not started yet. Will begin after first therapy session     GOALS: Goals reviewed with patient? Yes  SHORT TERM GOALS: Target date: 11/05/2021    Pt will demo abdominal breathing (AB) at rest 80% of the time for 5 minutes in 2 sessions Baseline: Goal status: Ongoing  2.  Pt will demo abdominal breathing (AB) 70% with answers to simple questions in 2 sessions Baseline:  Goal status: Ongoing  3.  Pt will decr throat clearing to <20 in 5 minutes Baseline:  Goal status: Ongoing  4.  Pt will tell SLP benefits of throat clearing/coughing alternatives  Baseline:  Goal Status: Met    LONG TERM GOALS: Target date: 12/03/2021    Pt will demo AB 70% in 5  minutes simple/mod complex conversation in 2 sessions Baseline:  Goal status: Ongoing  2.  Pt will decr throat clearing to <15 in 5 minutes Baseline:  Goal status: Ongoing  3.  Pt will tell SLP the possible benefits of pharmacologic intervention for chronic cough Baseline:  Goal status: Ongoing  4.  [place] will score 30/60 or lower on PROM the last 1-2 weeks of therapy Baseline:  Goal status: Ongoing   ASSESSMENT:  CLINICAL IMPRESSION: Patient is a 66 y.o. female who was seen today for treatment of chronic throat clear/cough and mild hoarseness. SLP believes pt's hoarseness will improve if pt decreases her frequency of throat clear/cough. SEE TX NOTE FOR MORE DETAILS. She tells SLP that once/month from April to June she also experienced wheezing ("It felt like my throat closed off") which was the reason for again seeking medical assistance for her chronic cough. SLP suggested pt talk with MD about pharmacological therapy for  chronic cough in addition to skilled ST focusing on abdominal breathing and vocal hygiene in order to decr frequency of her throat clear/cough.     OBJECTIVE IMPAIRMENTS include voice disorder. These impairments are limiting patient from household responsibilities, ADLs/IADLs, and effectively communicating at home and in community. Factors affecting potential to achieve goals and functional outcome are cooperation/participation level and severity of impairments.. Patient will benefit from skilled SLP services to address above impairments and improve overall function.  REHAB POTENTIAL: Fair due to time post onset and pt's reluctance to pharmacological alternatives to decr/eliminate chronic throat clear/cough  PLAN: SLP FREQUENCY: 2x/week  SLP DURATION: 8 weeks  PLANNED INTERVENTIONS: Environmental controls, SLP instruction and feedback, Compensatory strategies, Patient/family education, and abdominal breathing education/practice    Mekiah Cambridge,  CCC-SLP 10/22/2021, 12:27 PM

## 2021-10-24 ENCOUNTER — Other Ambulatory Visit (HOSPITAL_COMMUNITY): Payer: Self-pay

## 2021-10-24 ENCOUNTER — Ambulatory Visit: Payer: PPO

## 2021-10-24 DIAGNOSIS — R1310 Dysphagia, unspecified: Secondary | ICD-10-CM

## 2021-10-24 DIAGNOSIS — R053 Chronic cough: Secondary | ICD-10-CM

## 2021-10-24 DIAGNOSIS — R490 Dysphonia: Secondary | ICD-10-CM

## 2021-10-24 NOTE — Therapy (Signed)
OUTPATIENT SPEECH LANGUAGE PATHOLOGY VOICE TREATMENT   Patient Name: Olivia Rubio MRN: [id number] DOB:[birth year], 66 y.o., female Today's Date: 10/24/2021  PCP: Maurice Small, MD REFERRING PROVIDER: Angelique Holm, PA-C    End of Session - 10/24/21 0931     Visit Number 4    Number of Visits 11    Date for SLP Re-Evaluation 12/17/21    SLP Start Time 0849    SLP Stop Time  0929    SLP Time Calculation (min) 40 min    Activity Tolerance Patient tolerated treatment well                Past Medical History:  Diagnosis Date   Acid reflux    Arthritis    Baker's cyst of knee    Change in voice    Cough    Endometriosis    H/O: hysterectomy    Hypothyroidism    Nasal congestion    Osteoporosis    Thyroid nodule    Past Surgical History:  Procedure Laterality Date   [place]  2008   Patient Active Problem List   Diagnosis Date Noted   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 10/27/2020   DOE (dyspnea on exertion) 09/30/2016   Upper airway cough syndrome 08/29/2016   Ankle fracture, right, closed, initial encounter 07/16/2016   Multinodular goiter (nontoxic), dominant left thyroid nodule 11/12/2010   SINUSITIS 07/09/2006    Onset date: "15-20 years ago"  REFERRING DIAG: R05.3 (ICD-10-CM) - Persistent cough   THERAPY DIAG:  Voice hoarseness  Chronic cough  Dysphagia, unspecified type  Rationale for Evaluation and Treatment Rehabilitation  SUBJECTIVE:   SUBJECTIVE STATEMENT: "I think the (abdominal breathing (AB)) works better than the other things for not clearing my throat. I know it's working." Pt accompanied by: self  PERTINENT HISTORY: Pt PMH; GERD, hiatal hernia, RA.   PAIN:  Are you having pain? No  PATIENT GOALS Reduce frequency of throat clearing  OBJECTIVE:   TODAY'S TREATMENT:  10/24/21: Pt is noticing she is breathing with abdomen now  during the day, at times. She is still having coughing spells but they are less frequent and last a shorter time. Throat clearing has also decr'd in frequency. Olivia Rubio is breathing with abdomen for 15 minutes BID and some shorter 5-10 minute segments. SLP highlighted the reduction in chronic cough/vocal cord dysfunction sx with her frequency of practice with AB. Today pt had 12 clears in 14 minutes and 7 in 17 minutes (3 cough, 4 clears). SLP congratulated pt on her progress thus far.  At times pt was getting "just a little bit dizzy" so SLP had pt focus more on fluid transition from inhale to exhale, and taking a slightly longer inhale. This was intermittently successful. Pt to cont with at least 15 minutes AB BID, with as many 5-10 minute segments as possible also. If possible, she will begin to verbalize "mamamama" on exhale.   10/22/21: "You can't do those (throat clear compensations) around other people or you look like a nut." Pt has been performing hard "h" sound when she is not around others. The "scolding mmm" is too much like throat clearing and pt cannot perform it without doing a throat clear. The water as an alternative seems to facilitate more throat clearing (SLP wonders if temperature or sensation is part of the hypersensitivity - pt states colder or warmer things does not appear  to change frequency of clearing). Pt cont to say sweets (liquids and solids), and riding in a car triggers the incr in throat clearing; yesterday she did very little throat clearing and did not ride in a car. During this conversation of 3 minutes pt cleared 21 times. Pt stated she feels dizzy if she has >5 minutes of AB; When SLP had pt produce what she was doing at home she was holding breath for approx 1 second - SLP worked with pt to correct this. Afterwards pt and SLP worked with abdominal breathing (AB) in seated position in isolation. Pt with 95% success with AB, and 4 throat clears in 16 minutes. The next 4 minutes  pt cleared her throat 11 times. SLP drew this to pt's attention including the 21 clears in 3 minutes and she stated that was a notable difference. SLP STRONGLY encouraged pt to practice AB at least 10 minutes, 2-3 times a day until next session.  Lastly pt noted tension in her throat during and after AB. SLP talked with pt about what to think of when pt notes she is going to have a coughing episode (AB, sniff-blow, relaxation strategies for larynx/pharynx). SLP practiced sniff-blow with pt - reluctant to do technique until SLP assured her it was socially acceptable.   10/10/21: SLP began with abdominal breathing (AB) education. Pt with 17 throat clears in 5 minutes working with AB, which is a drastic reduction compared to her evaluation. Pt attributes this to not eating yet this morning - SLP postulates this is possible, but a drastic reduction like that has much to do with getting tension/focus off of throat area. After 9 minutes of focus and conversation about AB, throat clears/coughing frequency slowly decr'd in frequency and counted as 33. SLP then educated pt about throat clearing alternatives and got pt to practice each one. With this 13 minute conversation, throat clearing/coughing gradually increased in frequency over time to 65 total during this time. Pt did not spontaneously use throat clearing alternatives even though SLP assured pt could perform each correctly. SLP strongly encouraged pt to engage in throat clearing alternatives and reminded her how vocally abusive extensive throat clearing can be and also produces additional mucous. Pt demonstrated understanding.  10/08/21: SLP discussed results of exam and the framework for therapy going forward. SLP suggested pt contact her PCP or her GI MD to discuss the feasibility of re-starting regimen of Gabapentin given her current symptoms reportedly appear to be increasing/more frequent and more severe (now pt has had throat closing sx in three straight  months from April to June 2023).   PATIENT EDUCATION: Education details: see above in "today's treatment" Person educated: Patient Education method: Customer service manager Education comprehension: verbalized understanding, return demo   [place]: Not started yet. Will begin after first therapy session     GOALS: Goals reviewed with patient? Yes  SHORT TERM GOALS: Target date: 11/05/2021    Pt will demo abdominal breathing (AB) at rest 80% of the time for 5 minutes in 2 sessions Baseline:10-24-21 Goal status: Ongoing  2.  Pt will demo abdominal breathing (AB) 70% with answers to simple questions in 2 sessions Baseline:  Goal status: Ongoing  3.  Pt will decr throat clearing to <20 in 5 minutes Baseline:  Goal status: Met  4.  Pt will tell SLP benefits of throat clearing/coughing alternatives  Baseline:  Goal Status: Met    LONG TERM GOALS: Target date: 12/03/2021    Pt will demo  AB 70% in 5 minutes simple/mod complex conversation in 2 sessions Baseline:  Goal status: Ongoing  2.  Pt will decr throat clearing to <15 in 5 minutes Baseline:  Goal status: Ongoing  3.  Pt will tell SLP the possible benefits of pharmacologic intervention for chronic cough Baseline:  Goal status: Ongoing  4.  [place] will score 30/60 or lower on PROM the last 1-2 weeks of therapy Baseline:  Goal status: Ongoing   ASSESSMENT:  CLINICAL IMPRESSION: Patient is a 66 y.o. female who was seen today for treatment of chronic throat clear/cough and mild hoarseness. SLP believes pt's hoarseness will improve if pt decreases her frequency of throat clear/cough. SEE TX NOTE FOR MORE DETAILS. She tells SLP that once/month from April to June she also experienced wheezing ("It felt like my throat closed off") which was the reason for again seeking medical assistance for her chronic cough. SLP suggested pt talk with MD about pharmacological therapy for chronic cough in addition  to skilled ST focusing on abdominal breathing and vocal hygiene in order to decr frequency of her throat clear/cough.     OBJECTIVE IMPAIRMENTS include voice disorder. These impairments are limiting patient from household responsibilities, ADLs/IADLs, and effectively communicating at home and in community. Factors affecting potential to achieve goals and functional outcome are cooperation/participation level and severity of impairments.. Patient will benefit from skilled SLP services to address above impairments and improve overall function.  REHAB POTENTIAL: Fair due to time post onset and pt's reluctance to pharmacological alternatives to decr/eliminate chronic throat clear/cough  PLAN: SLP FREQUENCY: 2x/week  SLP DURATION: 8 weeks  PLANNED INTERVENTIONS: Environmental controls, SLP instruction and feedback, Compensatory strategies, Patient/family education, and abdominal breathing education/practice    Valaree Fresquez, CCC-SLP 10/24/2021, 9:31 AM

## 2021-10-29 ENCOUNTER — Ambulatory Visit: Payer: PPO

## 2021-10-31 ENCOUNTER — Ambulatory Visit: Payer: PPO

## 2021-10-31 DIAGNOSIS — R053 Chronic cough: Secondary | ICD-10-CM

## 2021-10-31 DIAGNOSIS — R490 Dysphonia: Secondary | ICD-10-CM

## 2021-10-31 DIAGNOSIS — R1310 Dysphagia, unspecified: Secondary | ICD-10-CM

## 2021-10-31 NOTE — Therapy (Signed)
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 10/27/2020   DOE (dyspnea on exertion) 09/30/2016   Upper airway cough syndrome 08/29/2016   Ankle fracture, right, closed, initial encounter 07/16/2016   Multinodular goiter (nontoxic), dominant left thyroid nodule 11/12/2010   SINUSITIS 07/09/2006    Onset date: "15-20 years ago"  REFERRING DIAG: R05.3 (ICD-10-CM) - Persistent cough   THERAPY DIAG:  Chronic cough  Voice hoarseness  Dysphagia, unspecified type  Rationale for Evaluation and Treatment Rehabilitation  SUBJECTIVE:   SUBJECTIVE STATEMENT: Pt relates a coughing epidode last week after ST where she "tried all the things" and nothing worked.  Pt accompanied by: self  PERTINENT HISTORY: Pt PMH; GERD, hiatal hernia, RA.   PAIN:  Are you having pain? No  PATIENT GOALS Reduce frequency of throat clearing  OBJECTIVE:   TODAY'S TREATMENT:  10/31/21: Pt has noticed with cold drinks it does not trigger sx of VCD. Ice cream does trigger throat clear/cough sx. Pt has practiced abdominal breathing (AB) consistently since last session however is "getting woozy" between 10-13 minutes of practice. "Maybe because it's because I'm losing focus, I don't know." SLP worked with pt today on AB. She  used AB for 17 minutes with "Maybe a little woozy? If it was it wasn't much at all." SLP suspects pt loses focus after 10 minutes AB and begins holding breath. She had 11 throat clears and one cough in 32 minutes (four throat clears and one cough in the first 9 minutes, prior to practicing AB). SLP again encouraged pt to consider gabapentin to further manage s/sx VCD.  10/24/21: Pt is noticing she is breathing with abdomen now during the day, at times. She is still having coughing spells but they are less frequent and last a shorter time. Throat clearing has also decr'd in frequency. [place] is breathing with abdomen for 15 minutes BID and some shorter 5-10 minute segments. SLP highlighted the reduction in chronic cough/vocal cord dysfunction sx with her frequency of practice with AB. Today pt had 12 clears in 14 minutes and 7 in 17 minutes (3 cough, 4 clears). SLP congratulated pt on her progress thus far.  At times pt was getting "just a little bit dizzy" so SLP had pt focus more on fluid transition from inhale to exhale, and taking a slightly longer inhale. This was intermittently successful. Pt to cont with at least 15 minutes AB BID, with as many 5-10 minute segments as possible also. If possible, she will begin to verbalize "mamamama" on exhale.   10/22/21: "You can't do those (throat clear compensations) around other people or you look like a nut." Pt has been performing hard "h" sound when she  is not around others. The "scolding mmm" is too much like throat clearing and pt cannot perform it without doing a throat clear. The water as an alternative seems to facilitate more throat clearing (SLP wonders if temperature or sensation is part of the hypersensitivity - pt states colder or warmer things does not appear to change frequency of clearing). Pt cont to say sweets (liquids and solids), and riding in a car triggers the incr in throat clearing; yesterday she did very little throat clearing and did not  ride in a car. During this conversation of 3 minutes pt cleared 21 times. Pt stated she feels dizzy if she has >5 minutes of AB; When SLP had pt produce what she was doing at home she was holding breath for approx 1 second - SLP worked with pt to correct this. Afterwards pt and SLP worked with abdominal breathing (AB) in seated position in isolation. Pt with 95% success with AB, and 4 throat clears in 16 minutes. The next 4 minutes pt cleared her throat 11 times. SLP drew this to pt's attention including the 21 clears in 3 minutes and she stated that was a notable difference. SLP STRONGLY encouraged pt to practice AB at least 10 minutes, 2-3 times a day until next session.  Lastly pt noted tension in her throat during and after AB. SLP talked with pt about what to think of when pt notes she is going to have a coughing episode (AB, sniff-blow, relaxation strategies for larynx/pharynx). SLP practiced sniff-blow with pt - reluctant to do technique until SLP assured her it was socially acceptable.   10/10/21: SLP began with abdominal breathing (AB) education. Pt with 17 throat clears in 5 minutes working with AB, which is a drastic reduction compared to her evaluation. Pt attributes this to not eating yet this morning - SLP postulates this is possible, but a drastic reduction like that has much to do with getting tension/focus off of throat area. After 9 minutes of focus and conversation about AB, throat clears/coughing frequency slowly decr'd in frequency and counted as 33. SLP then educated pt about throat clearing alternatives and got pt to practice each one. With this 13 minute conversation, throat clearing/coughing gradually increased in frequency over time to 65 total during this time. Pt did not spontaneously use throat clearing alternatives even though SLP assured pt could perform each correctly. SLP strongly encouraged pt to engage in throat clearing alternatives and reminded her how vocally abusive  extensive throat clearing can be and also produces additional mucous. Pt demonstrated understanding.  10/08/21: SLP discussed results of exam and the framework for therapy going forward. SLP suggested pt contact her PCP or her GI MD to discuss the feasibility of re-starting regimen of Gabapentin given her current symptoms reportedly appear to be increasing/more frequent and more severe (now pt has had throat closing sx in three straight months from April to June 2023).   PATIENT EDUCATION: Education details: see above in "today's treatment" Person educated: Patient Education method: Customer service manager Education comprehension: verbalized understanding, return demo   [place]: Not started yet. Will begin after first therapy session     GOALS: Goals reviewed with patient? Yes  SHORT TERM GOALS: Target date: 11/05/2021    Pt will demo abdominal breathing (AB) at rest 80% of the time for 5 minutes in 2 sessions Baseline:10-24-21 Goal status: Met  2.  Pt will demo abdominal breathing (AB) 70% with answers to simple questions in 2 sessions Baseline:  Goal status: Ongoing  3.  Pt will decr throat clearing to <20 in 5 minutes Baseline:  Goal status: Met  4.  Pt will tell SLP benefits of throat clearing/coughing alternatives  Baseline:  Goal Status: Met    LONG TERM GOALS: Target date: 12/03/2021    Pt will demo AB 70% in 5 minutes simple/mod complex conversation in 2 sessions Baseline:  Goal status: Ongoing  2.  Pt will decr throat clearing to <15 in 5 minutes Baseline:  Goal status: Ongoing  3.  Pt will tell SLP the possible benefits of pharmacologic intervention for chronic cough Baseline:  Goal status: Ongoing  4.  [place] will score 30/60 or lower on PROM the last 1-2 weeks of therapy Baseline:  Goal status: Ongoing   ASSESSMENT:  CLINICAL IMPRESSION: Patient is a 66 y.o. female who was seen today for treatment of chronic throat  clear/cough and mild hoarseness. SLP believes pt's hoarseness will improve if pt decreases her frequency of throat clear/cough. SEE TX NOTE FOR MORE DETAILS. She tells SLP that once/month from April to June she also experienced wheezing ("It felt like my throat closed off") which was the reason for again seeking medical assistance for her chronic cough. SLP suggested pt talk with MD about pharmacological therapy for chronic cough in addition to skilled ST focusing on abdominal breathing and vocal hygiene in order to decr frequency of her throat clear/cough.     OBJECTIVE IMPAIRMENTS include voice disorder. These impairments are limiting patient from household responsibilities, ADLs/IADLs, and effectively communicating at home and in community. Factors affecting potential to achieve goals and functional outcome are cooperation/participation level and severity of impairments.. Patient will benefit from skilled SLP services to address above impairments and improve overall function.  REHAB POTENTIAL: Fair due to time post onset and pt's reluctance to pharmacological alternatives to decr/eliminate chronic throat clear/cough  PLAN: SLP FREQUENCY: 2x/week  SLP DURATION: 8 weeks  PLANNED INTERVENTIONS: Environmental controls, SLP instruction and feedback, Compensatory strategies, Patient/family education, and abdominal breathing education/practice    Yeiren [place], CCC-SLP 10/31/2021, 9:41 AM

## 2021-11-07 ENCOUNTER — Ambulatory Visit: Payer: PPO

## 2021-11-07 DIAGNOSIS — R1310 Dysphagia, unspecified: Secondary | ICD-10-CM

## 2021-11-07 DIAGNOSIS — R490 Dysphonia: Secondary | ICD-10-CM

## 2021-11-07 DIAGNOSIS — R053 Chronic cough: Secondary | ICD-10-CM | POA: Diagnosis not present

## 2021-11-07 NOTE — Therapy (Signed)
OUTPATIENT SPEECH LANGUAGE PATHOLOGY VOICE TREATMENT   Patient Name: Olivia Rubio MRN: [id number] DOB:30-Aug-1955, 66 y.o., female Today's Date: 11/07/2021  PCP: Maurice Small, MD REFERRING PROVIDER: Angelique Holm, PA-C    End of Session - 11/07/21 1007     Visit Number 6    Number of Visits 11    Date for SLP Re-Evaluation 12/17/21    SLP Start Time 0933    SLP Stop Time  1015    SLP Time Calculation (min) 42 min    Activity Tolerance Patient tolerated treatment well                 Past Medical History:  Diagnosis Date   Acid reflux    Arthritis    Baker's cyst of knee    Change in voice    Cough    Endometriosis    H/O: hysterectomy    Hypothyroidism    Nasal congestion    Osteoporosis    Thyroid nodule    Past Surgical History:  Procedure Laterality Date   [place]  2008   Patient Active Problem List   Diagnosis Date Noted   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  Pt accompanied by: self  PERTINENT HISTORY: Pt PMH; GERD, hiatal hernia, RA.   PAIN:  Are you having pain? No and Yes: NPRS scale: 1/10 Pain location: head Pain description: headache Aggravating factors: none Relieving factors: none  PATIENT GOALS Reduce frequency of throat  clearing  OBJECTIVE:   TODAY'S TREATMENT:  11/07/21: Pt arrives today with one throat clear in the first 6 minutes. [name] states her s/sx of vocal cord dysfunction (VCD) are decr by 75% in the last few days but she remains skeptical it will continue. SLP told her likely due to her long-standing coughing. Today SLP worked with pt using abdominal breathing with speech. Min-mod cues at start of rote task to talk at top of breath, usually, faded to rare min A. When SLP progressed to simple questions pt began holding breath and answering, and taking in more air than with previous answers. SLP cued pt with visual cues to take a breath and talk at the top of the breath.  Eight throat clears in 38 minutes.   10/31/21: Pt has noticed with cold drinks it does not trigger sx of VCD. Ice cream does trigger throat clear/cough sx. Pt has practiced abdominal breathing (AB) consistently since last session however is "getting woozy" between 10-13 minutes of practice. "Maybe because it's because I'm losing focus, I don't know." SLP worked with pt today on AB. She used AB for 17 minutes with "Maybe a little woozy? If it was it wasn't much at all." SLP suspects pt loses focus after 10 minutes AB and begins holding breath. She had 11 throat clears and one cough in 32 minutes (four throat clears and  one cough in the first 9 minutes, prior to practicing AB). SLP again encouraged pt to consider gabapentin to further manage s/sx VCD.  10/24/21: Pt is noticing she is breathing with abdomen now during the day, at times. She is still having coughing spells but they are less frequent and last a shorter time. Throat clearing has also decr'd in frequency. [name] is breathing with abdomen for 15 minutes BID and some shorter 5-10 minute segments. SLP highlighted the reduction in chronic cough/vocal cord dysfunction sx with her frequency of practice with AB. Today pt had 12 clears in 14 minutes and 7 in 17 minutes (3 cough, 4 clears).  SLP congratulated pt on her progress thus far.  At times pt was getting "just a little bit dizzy" so SLP had pt focus more on fluid transition from inhale to exhale, and taking a slightly longer inhale. This was intermittently successful. Pt to cont with at least 15 minutes AB BID, with as many 5-10 minute segments as possible also. If possible, she will begin to verbalize "mamamama" on exhale.   10/22/21: "You can't do those (throat clear compensations) around other people or you look like a nut." Pt has been performing hard "h" sound when she is not around others. The "scolding mmm" is too much like throat clearing and pt cannot perform it without doing a throat clear. The water as an alternative seems to facilitate more throat clearing (SLP wonders if temperature or sensation is part of the hypersensitivity - pt states colder or warmer things does not appear to change frequency of clearing). Pt cont to say sweets (liquids and solids), and riding in a car triggers the incr in throat clearing; yesterday she did very little throat clearing and did not ride in a car. During this conversation of 3 minutes pt cleared 21 times. Pt stated she feels dizzy if she has >5 minutes of AB; When SLP had pt produce what she was doing at home she was holding breath for approx 1 second - SLP worked with pt to correct this. Afterwards pt and SLP worked with abdominal breathing (AB) in seated position in isolation. Pt with 95% success with AB, and 4 throat clears in 16 minutes. The next 4 minutes pt cleared her throat 11 times. SLP drew this to pt's attention including the 21 clears in 3 minutes and she stated that was a notable difference. SLP STRONGLY encouraged pt to practice AB at least 10 minutes, 2-3 times a day until next session.  Lastly pt noted tension in her throat during and after AB. SLP talked with pt about what to think of when pt notes she is going to have a coughing episode (AB, sniff-blow, relaxation strategies  for larynx/pharynx). SLP practiced sniff-blow with pt - reluctant to do technique until SLP assured her it was socially acceptable.   10/10/21: SLP began with abdominal breathing (AB) education. Pt with 17 throat clears in 5 minutes working with AB, which is a drastic reduction compared to her evaluation. Pt attributes this to not eating yet this morning - SLP postulates this is possible, but a drastic reduction like that has much to do with getting tension/focus off of throat area. After 9 minutes of focus and conversation about AB, throat clears/coughing frequency slowly decr'd in frequency and counted as 33. SLP then educated pt about throat clearing alternatives and got pt to practice each one. With this 13 minute conversation, throat clearing/coughing gradually increased in frequency over time to 65 total during  this time. Pt did not spontaneously use throat clearing alternatives even though SLP assured pt could perform each correctly. SLP strongly encouraged pt to engage in throat clearing alternatives and reminded her how vocally abusive extensive throat clearing can be and also produces additional mucous. Pt demonstrated understanding.  10/08/21: SLP discussed results of exam and the framework for therapy going forward. SLP suggested pt contact her PCP or her GI MD to discuss the feasibility of re-starting regimen of Gabapentin given her current symptoms reportedly appear to be increasing/more frequent and more severe (now pt has had throat closing sx in three straight months from April to June 2023).   PATIENT EDUCATION: Education details: see above in "today's treatment" Person educated: Patient Education method: Customer service manager Education comprehension: verbalized understanding, return demo   [place]: Not started yet. Will begin after first therapy session     GOALS: Goals reviewed with patient? Yes  SHORT TERM GOALS: Target date: 11/05/2021    Pt will  demo abdominal breathing (AB) at rest 80% of the time for 5 minutes in 2 sessions Baseline:10-24-21 Goal status: Met  2.  Pt will demo abdominal breathing (AB) 70% with answers to simple questions in 2 sessions Baseline:  Goal status: Partially met  3.  Pt will decr throat clearing to <20 in 5 minutes Baseline:  Goal status: Met  4.  Pt will tell SLP benefits of throat clearing/coughing alternatives  Baseline:  Goal Status: Met    LONG TERM GOALS: Target date: 12/03/2021    Pt will demo AB 70% in 5 minutes simple/mod complex conversation in 2 sessions Baseline:  Goal status: Ongoing  2.  Pt will decr throat clearing to <15 in 5 minutes Baseline:  Goal status: Ongoing  3.  Pt will tell SLP the possible benefits of pharmacologic intervention for chronic cough Baseline:  Goal status: Ongoing  4.  [place] will score 30/60 or lower on PROM the last 1-2 weeks of therapy Baseline:  Goal status: Ongoing   ASSESSMENT:  CLINICAL IMPRESSION: Partially met STGs (3 met/1 partially met). Patient is a 66 y.o. female who was seen today for treatment of chronic throat clear/cough and mild hoarseness. SEE TX NOTE FOR MORE DETAILS. She tells SLP that once/month from April to June she also experienced wheezing ("It felt like my throat closed off") which was the reason for again seeking medical assistance for her chronic cough. Pt is still reluctant to try pharmacological therapy for chronic cough.     OBJECTIVE IMPAIRMENTS include voice disorder. These impairments are limiting patient from household responsibilities, ADLs/IADLs, and effectively communicating at home and in community. Factors affecting potential to achieve goals and functional outcome are cooperation/participation level and severity of impairments.. Patient will benefit from skilled SLP services to address above impairments and improve overall function.  REHAB POTENTIAL: Fair due to time post onset and pt's reluctance to  pharmacological alternatives to decr/eliminate chronic throat clear/cough  PLAN: SLP FREQUENCY: 2x/week  SLP DURATION: 8 weeks  PLANNED INTERVENTIONS: Environmental controls, SLP instruction and feedback, Compensatory strategies, Patient/family education, and abdominal breathing education/practice    Christabell Loseke, CCC-SLP 11/07/2021, 10:08 AM

## 2021-11-13 ENCOUNTER — Other Ambulatory Visit: Payer: Self-pay | Admitting: Gastroenterology

## 2021-11-13 ENCOUNTER — Ambulatory Visit
Admission: RE | Admit: 2021-11-13 | Discharge: 2021-11-13 | Disposition: A | Discharge status: Home / Self Care | Payer: PPO | Source: Ambulatory Visit | Attending: Gastroenterology | Admitting: Gastroenterology

## 2021-11-13 ENCOUNTER — Other Ambulatory Visit (HOSPITAL_COMMUNITY): Payer: Self-pay

## 2021-11-13 DIAGNOSIS — M069 Rheumatoid arthritis, unspecified: Secondary | ICD-10-CM | POA: Diagnosis not present

## 2021-11-13 DIAGNOSIS — E559 Vitamin D deficiency, unspecified: Secondary | ICD-10-CM | POA: Diagnosis not present

## 2021-11-13 DIAGNOSIS — Z23 Encounter for immunization: Secondary | ICD-10-CM | POA: Diagnosis not present

## 2021-11-13 DIAGNOSIS — K449 Diaphragmatic hernia without obstruction or gangrene: Secondary | ICD-10-CM | POA: Diagnosis not present

## 2021-11-13 DIAGNOSIS — K5730 Diverticulosis of large intestine without perforation or abscess without bleeding: Secondary | ICD-10-CM | POA: Diagnosis not present

## 2021-11-13 DIAGNOSIS — Z8719 Personal history of other diseases of the digestive system: Secondary | ICD-10-CM

## 2021-11-13 DIAGNOSIS — E049 Nontoxic goiter, unspecified: Secondary | ICD-10-CM | POA: Diagnosis not present

## 2021-11-13 DIAGNOSIS — N281 Cyst of kidney, acquired: Secondary | ICD-10-CM | POA: Diagnosis not present

## 2021-11-13 DIAGNOSIS — M810 Age-related osteoporosis without current pathological fracture: Secondary | ICD-10-CM | POA: Diagnosis not present

## 2021-11-13 DIAGNOSIS — E213 Hyperparathyroidism, unspecified: Secondary | ICD-10-CM | POA: Diagnosis not present

## 2021-11-13 DIAGNOSIS — R109 Unspecified abdominal pain: Secondary | ICD-10-CM

## 2021-11-13 DIAGNOSIS — K429 Umbilical hernia without obstruction or gangrene: Secondary | ICD-10-CM | POA: Diagnosis not present

## 2021-11-13 MED ORDER — IOPAMIDOL (ISOVUE-300) INJECTION 61%
100.0000 mL | Freq: Once | INTRAVENOUS | Status: AC | PRN
  Administered 2021-11-13: 100 mL via INTRAVENOUS

## 2021-11-13 MED ORDER — DICYCLOMINE HCL 20 MG PO TABS
20.0000 mg | ORAL_TABLET | Freq: Two times a day (BID) | ORAL | 0 refills | Status: DC | PRN
  Filled 2021-11-13: qty 30, 15d supply, fill #0

## 2021-11-14 ENCOUNTER — Ambulatory Visit: Payer: PPO

## 2021-11-14 ENCOUNTER — Other Ambulatory Visit: Payer: Self-pay | Admitting: Endocrinology

## 2021-11-14 ENCOUNTER — Other Ambulatory Visit (HOSPITAL_COMMUNITY): Payer: Self-pay

## 2021-11-14 DIAGNOSIS — K5792 Diverticulitis of intestine, part unspecified, without perforation or abscess without bleeding: Secondary | ICD-10-CM | POA: Diagnosis not present

## 2021-11-14 DIAGNOSIS — E049 Nontoxic goiter, unspecified: Secondary | ICD-10-CM

## 2021-11-14 DIAGNOSIS — R1030 Lower abdominal pain, unspecified: Secondary | ICD-10-CM | POA: Diagnosis not present

## 2021-11-14 MED ORDER — AMOXICILLIN-POT CLAVULANATE 875-125 MG PO TABS
1.0000 | ORAL_TABLET | Freq: Two times a day (BID) | ORAL | 0 refills | Status: DC
  Filled 2021-11-14: qty 20, 10d supply, fill #0

## 2021-11-15 ENCOUNTER — Other Ambulatory Visit (HOSPITAL_COMMUNITY): Payer: Self-pay

## 2021-11-15 DIAGNOSIS — E213 Hyperparathyroidism, unspecified: Secondary | ICD-10-CM | POA: Diagnosis not present

## 2021-11-15 DIAGNOSIS — E559 Vitamin D deficiency, unspecified: Secondary | ICD-10-CM | POA: Diagnosis not present

## 2021-11-15 DIAGNOSIS — E049 Nontoxic goiter, unspecified: Secondary | ICD-10-CM | POA: Diagnosis not present

## 2021-11-20 ENCOUNTER — Other Ambulatory Visit (HOSPITAL_COMMUNITY): Payer: Self-pay

## 2021-11-21 ENCOUNTER — Other Ambulatory Visit: Payer: PPO

## 2021-11-21 ENCOUNTER — Other Ambulatory Visit (HOSPITAL_COMMUNITY): Payer: Self-pay

## 2021-11-21 ENCOUNTER — Ambulatory Visit: Payer: PPO | Attending: Gastroenterology

## 2021-11-21 DIAGNOSIS — R490 Dysphonia: Secondary | ICD-10-CM | POA: Insufficient documentation

## 2021-11-21 DIAGNOSIS — R053 Chronic cough: Secondary | ICD-10-CM | POA: Insufficient documentation

## 2021-11-21 DIAGNOSIS — R1310 Dysphagia, unspecified: Secondary | ICD-10-CM | POA: Diagnosis not present

## 2021-11-21 NOTE — Therapy (Signed)
OUTPATIENT SPEECH LANGUAGE PATHOLOGY VOICE TREATMENT   Patient Name: Olivia Rubio MRN: [id number] DOB:05-31-1955, 66 y.o., female Today's Date: 11/21/2021  PCP: Olivia Small, MD REFERRING PROVIDER: Angelique Holm, PA-C    End of Session - 11/21/21 1053     Visit Number 7    Number of Visits 11    Date for SLP Re-Evaluation 12/17/21    SLP Start Time 0936    SLP Stop Time  1016    SLP Time Calculation (min) 40 min    Activity Tolerance Patient tolerated treatment well                  Past Medical History:  Diagnosis Date   Acid reflux    Arthritis    Baker's cyst of knee    Change in voice    Cough    Endometriosis    H/O: hysterectomy    Hypothyroidism    Nasal congestion    Osteoporosis    Thyroid nodule    Past Surgical History:  Procedure Laterality Date   [place]  2008   Patient Active Problem List   Diagnosis Date Noted   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  Pt accompanied by: self  PERTINENT HISTORY: Pt PMH; GERD, hiatal hernia, RA.   PAIN:  Are you having pain? No  PATIENT GOALS Reduce frequency of throat clearing  OBJECTIVE:   TODAY'S TREATMENT:  11/21/21: When pt's diverticulitis began to flare up last week ([place]) pt's throat clearing and coughing started to increase.   5 throat clears in first 90 seconds walking back to ST room. Pt thinks that something with her GI tract has made her frequency of throat clears worse - SLP agrees as pt was "doing great before all this mess with my diverticulitis." SLP had pt focus on abdominal breathing (AB) and visualization for 4 1/2 minutes and this greatly reduced throat clear frequency; Prior to AB and visualization SLP counted 28 throat clears (3 coughs) and during visualization and AB throat clears reduced to 10. Pt's strategies were: focus on AB, picturing rocking chair on front porch, sniff-blow, candy, and hearing breathing cycle relax.  SLP heard audible breathing occasionally during the session today when pt not focused on AB. SLP encouraged pt to focus on AB and this subsided intermittently and then returned.Lastly, pt was told she may want to contact MD about Gabapentin but she was reluctant about starting this med again.  11/07/21: Pt arrives today with one throat clear in the first 6 minutes. Olivia Rubio states her s/sx of vocal cord dysfunction (VCD) are decr by 75% in the last few days but she remains skeptical it will continue. SLP told her likely due to her long-standing coughing. Today SLP worked with pt using abdominal breathing with speech. Min-mod cues at start of rote task to talk at top of breath, usually, faded  to rare min A. When SLP progressed to simple questions pt began holding breath and answering, and taking in more air than with previous answers. SLP cued pt with visual cues to take a breath and talk at the top of the breath.  Eight throat clears in 38 minutes.   10/31/21: Pt has noticed with cold drinks it does not trigger sx of VCD. Ice cream does trigger throat clear/cough sx. Pt has practiced abdominal breathing (AB) consistently since last session however is "getting woozy" between 10-13 minutes of practice. "Maybe because it's because I'm losing focus, I don't know." SLP worked with pt today on AB. She  used AB for 17 minutes with "Maybe a little woozy? If it was it wasn't much at all." SLP suspects pt loses focus after 10 minutes AB and begins holding breath. She had 11 throat clears and one cough in 32 minutes (four throat clears and one cough in the first 9 minutes, prior to practicing AB). SLP again encouraged pt to consider gabapentin to further manage s/sx VCD.  10/24/21: Pt is noticing she is breathing with abdomen now during the day, at times. She is still having coughing spells but they are less frequent and last a shorter time. Throat clearing has also decr'd in frequency. Olivia Rubio is breathing with abdomen for 15 minutes BID and some shorter 5-10 minute segments. SLP highlighted the reduction in chronic cough/vocal cord dysfunction sx with her frequency of practice with AB. Today pt had 12 clears in 14 minutes and 7 in 17 minutes (3 cough, 4 clears). SLP congratulated pt on her progress thus far.  At times pt was getting "just a little bit dizzy" so SLP had pt focus more on fluid transition from inhale to exhale, and taking a slightly longer inhale. This was intermittently successful. Pt to cont with at least 15 minutes AB BID, with as many 5-10 minute segments as possible also. If possible, she will begin to verbalize "mamamama" on exhale.   10/22/21: "You can't do those (throat clear compensations) around other people or you look like a nut." Pt has been performing hard "h" sound when she is not around others. The "scolding mmm" is too much like throat clearing and pt cannot perform it without doing a throat clear. The water as an alternative seems to facilitate more throat clearing (SLP wonders if temperature or sensation is part of the hypersensitivity - pt states colder or warmer things does not appear to change frequency of clearing). Pt cont to say sweets (liquids and solids), and riding in a car triggers the incr in throat clearing; yesterday she did very little throat clearing and did not  ride in a car. During this conversation of 3 minutes pt cleared 21 times. Pt stated she feels dizzy if she has >5 minutes of AB; When SLP had pt produce what she was doing at home she was holding breath for approx 1 second - SLP worked with pt to correct this. Afterwards pt and SLP worked with abdominal breathing (AB) in seated position in isolation. Pt with 95% success with AB, and 4 throat clears in 16 minutes. The next 4 minutes pt cleared her throat 11 times. SLP drew this to pt's attention including the 21 clears in 3 minutes and she stated that was a notable difference. SLP STRONGLY encouraged pt to practice AB at least 10 minutes, 2-3 times a day until next session.  Lastly pt noted tension in her throat during and after AB. SLP talked  with pt about what to think of when pt notes she is going to have a coughing episode (AB, sniff-blow, relaxation strategies for larynx/pharynx). SLP practiced sniff-blow with pt - reluctant to do technique until SLP assured her it was socially acceptable.   10/10/21: SLP began with abdominal breathing (AB) education. Pt with 17 throat clears in 5 minutes working with AB, which is a drastic reduction compared to her evaluation. Pt attributes this to not eating yet this morning - SLP postulates this is possible, but a drastic reduction like that has much to do with getting tension/focus off of throat area. After 9 minutes of focus and conversation about AB, throat clears/coughing frequency slowly decr'd in frequency and counted as 33. SLP then educated pt about throat clearing alternatives and got pt to practice each one. With this 13 minute conversation, throat clearing/coughing gradually increased in frequency over time to 65 total during this time. Pt did not spontaneously use throat clearing alternatives even though SLP assured pt could perform each correctly. SLP strongly encouraged pt to engage in throat clearing alternatives and reminded her how vocally abusive  extensive throat clearing can be and also produces additional mucous. Pt demonstrated understanding.  10/08/21: SLP discussed results of exam and the framework for therapy going forward. SLP suggested pt contact her PCP or her GI MD to discuss the feasibility of re-starting regimen of Gabapentin given her current symptoms reportedly appear to be increasing/more frequent and more severe (now pt has had throat closing sx in three straight months from April to June 2023).   PATIENT EDUCATION: Education details: see above in "today's treatment" Person educated: Patient Education method: Customer service manager Education comprehension: verbalized understanding, return demo   [place]: Not started yet. Will begin after first therapy session     GOALS: Goals reviewed with patient? Yes  SHORT TERM GOALS: Target date: 11/05/2021    Pt will demo abdominal breathing (AB) at rest 80% of the time for 5 minutes in 2 sessions Baseline:10-24-21 Goal status: Met  2.  Pt will demo abdominal breathing (AB) 70% with answers to simple questions in 2 sessions Baseline:  Goal status: Partially met  3.  Pt will decr throat clearing to <20 in 5 minutes Baseline:  Goal status: Met  4.  Pt will tell SLP benefits of throat clearing/coughing alternatives  Baseline:  Goal Status: Met    LONG TERM GOALS: Target date: 12/03/2021    Pt will demo AB 70% in 5 minutes simple/mod complex conversation in 2 sessions Baseline:  Goal status: Ongoing  2.  Pt will decr throat clearing to <15 in 5 minutes Baseline:  Goal status: Ongoing  3.  Pt will tell SLP the possible benefits of pharmacologic intervention for chronic cough Baseline:  Goal status: Met  4.  [place] will score 30/60 or lower on PROM the last 1-2 weeks of therapy Baseline:  Goal status: Ongoing   ASSESSMENT:  CLINICAL IMPRESSION: Patient is a 66 y.o. female who was seen today for treatment of chronic throat  clear/cough and mild hoarseness. SEE TX NOTE FOR MORE DETAILS. Today frequency of throat clearing much worse than the previous 3 sessions-pt attributes to flare up of diverticulitis sx last week.  She tells SLP that once/month from April to June she also experienced wheezing ("It felt like my throat closed off") which was the reason for again seeking medical assistance for her chronic cough. Pt is still reluctant to try pharmacological therapy for chronic cough.  OBJECTIVE IMPAIRMENTS include voice disorder. These impairments are limiting patient from household responsibilities, ADLs/IADLs, and effectively communicating at home and in community. Factors affecting potential to achieve goals and functional outcome are cooperation/participation level and severity of impairments.. Patient will benefit from skilled SLP services to address above impairments and improve overall function.  REHAB POTENTIAL: Fair due to time post onset and pt's reluctance to pharmacological alternatives to decr/eliminate chronic throat clear/cough  PLAN: SLP FREQUENCY: 2x/week  SLP DURATION: 8 weeks  PLANNED INTERVENTIONS: Environmental controls, SLP instruction and feedback, Compensatory strategies, Patient/family education, and abdominal breathing education/practice    Tommaso Cavitt, CCC-SLP 11/21/2021, 10:53 AM

## 2021-11-22 ENCOUNTER — Ambulatory Visit
Admission: RE | Admit: 2021-11-22 | Discharge: 2021-11-22 | Disposition: A | Discharge status: Home / Self Care | Payer: PPO | Source: Ambulatory Visit | Attending: Endocrinology | Admitting: Endocrinology

## 2021-11-22 DIAGNOSIS — E042 Nontoxic multinodular goiter: Secondary | ICD-10-CM | POA: Diagnosis not present

## 2021-11-22 DIAGNOSIS — E049 Nontoxic goiter, unspecified: Secondary | ICD-10-CM

## 2021-11-27 DIAGNOSIS — R0989 Other specified symptoms and signs involving the circulatory and respiratory systems: Secondary | ICD-10-CM | POA: Diagnosis not present

## 2021-11-27 DIAGNOSIS — Z86010 Personal history of colonic polyps: Secondary | ICD-10-CM | POA: Diagnosis not present

## 2021-11-27 DIAGNOSIS — Z8719 Personal history of other diseases of the digestive system: Secondary | ICD-10-CM | POA: Diagnosis not present

## 2021-11-28 ENCOUNTER — Other Ambulatory Visit (HOSPITAL_COMMUNITY): Payer: Self-pay

## 2021-11-28 ENCOUNTER — Ambulatory Visit: Payer: PPO

## 2021-11-28 DIAGNOSIS — R053 Chronic cough: Secondary | ICD-10-CM

## 2021-11-28 DIAGNOSIS — R490 Dysphonia: Secondary | ICD-10-CM | POA: Diagnosis not present

## 2021-11-28 DIAGNOSIS — R1310 Dysphagia, unspecified: Secondary | ICD-10-CM

## 2021-11-28 MED ORDER — DICYCLOMINE HCL 20 MG PO TABS
20.0000 mg | ORAL_TABLET | Freq: Three times a day (TID) | ORAL | 3 refills | Status: AC | PRN
  Filled 2021-11-28: qty 90, 30d supply, fill #0

## 2021-11-28 NOTE — Therapy (Signed)
OUTPATIENT SPEECH LANGUAGE PATHOLOGY VOICE TREATMENT/DISCHARGE   Patient Name: Olivia Rubio MRN: [id number] DOB:04/28/1955, 66 y.o., female Today's Date: 11/28/2021  PCP: Maurice Small, MD REFERRING PROVIDER: Angelique Holm, PA-C    End of Session - 11/28/21 1025     Visit Number 8    Number of Visits 11    Date for SLP Re-Evaluation 12/17/21    SLP Start Time 1023    SLP Stop Time  1100    SLP Time Calculation (min) 37 min    Activity Tolerance Patient tolerated treatment well                  Past Medical History:  Diagnosis Date   Acid reflux    Arthritis    Baker's cyst of knee    Change in voice    Cough    Endometriosis    H/O: hysterectomy    Hypothyroidism    Nasal congestion    Osteoporosis    Thyroid nodule    Past Surgical History:  Procedure Laterality Date   [place]  2008   Patient Active Problem List   Diagnosis Date Noted   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 Pt has improved her ability to quell VCD s/sx with abdominal breathing, however still has challenges limiting frequency of "spells", and has recently experienced flare up of GI issues (pt thinks diverticulitis) which has caused more acute challenges with sx of VCD.  At this time SLP and pt agree she may require some pharmacological intervention. SLP suggested pt see Dr. Chase Caller to seek his input.   Remaining deficits: Appears as mod sx of vocal cord dysfunction.   Education /  Equipment: Relaxation strategies/techniques, abdominal breathing, sniff-blow technique   Patient agrees to discharge. Patient goals were partially met. Patient is being discharged due to maximized rehab potential. (at this time.)    Onset date: "15-20 years ago"  REFERRING DIAG: R05.3 (ICD-10-CM) - Persistent cough   THERAPY DIAG:  Voice hoarseness  Dysphagia, unspecified type  Chronic cough  Rationale for Evaluation and Treatment Rehabilitation  SUBJECTIVE:   SUBJECTIVE STATEMENT: "I was doing pretty good yesterday until I went in the car to go to the stomach doctor."  Pt accompanied by: self  PERTINENT HISTORY: Pt PMH; GERD, hiatal hernia, RA.   PAIN:  Are you having pain? No  PATIENT GOALS Reduce frequency of throat clearing  OBJECTIVE:   TODAY'S TREATMENT:  11/28/21: Pt arrived with mod frequent throat clearing - however not as significant as last session. Seven throat clears heard in first 3 minutes with SLP. SLP had pt use tactile stimulation on her abdomen and focus on abdominal breathing (AB) - audible respiration heard indicating likely vocal fold tension. Over the next 4 minutes and with SLP occasional min-mod cues (faded to rare min cues) to use pt's  relaxation techniques audible respirations ceased and pt cleared her throat 3 times in the next 12 minutes. After this time, SLP explained to pt that she has the tools to cease audible respiration spontaneously (  demo'd today) and to use AB to assist in reducing sx of VCD. As soon as this 6 minute discussion started, pt began to clear throat again - 10 throat clears and one cough, in these 6 minutes. Pt decided she will make appointment with Dr. Chase Caller at [facility] and is open to trying gabpentin again, if that is recommended to her. "I'm thinking I want to (try gabapentin) now - I'm over all of this to the point I'll try it (gabapentin)." Lastly, SLP agreed with pt that she has made some gains with ST but SLP  has assisted to the extent possible at this time.   11/21/21: When pt's diverticulitis began to flare up last week (Tuesday) pt's throat clearing and coughing started to increase.  5 throat clears in first 90 seconds walking back to ST room. Pt thinks that something with her GI tract has made her frequency of throat clears worse - SLP agrees as pt was "doing great before all this mess with my diverticulitis." SLP had pt focus on abdominal breathing (AB) and visualization for 4 1/2 minutes and this greatly reduced throat clear frequency; Prior to AB and visualization SLP counted 28 throat clears (3 coughs) and during visualization and AB throat clears reduced to 10. Pt's strategies were: focus on AB, picturing rocking chair on front porch, sniff-blow, candy, and hearing breathing cycle relax.  SLP heard audible breathing occasionally during the session today when pt not focused on AB. SLP encouraged pt to focus on AB and this subsided intermittently and then returned.Lastly, pt was told she may want to contact MD about Gabapentin but she was reluctant about starting this med again.  11/07/21: Pt arrives today with one throat clear in the first 6 minutes. Olivia Rubio states her s/sx of vocal cord dysfunction (VCD) are decr by 75% in the last few days but she remains skeptical it will continue. SLP told her likely due to her long-standing coughing. Today SLP worked with pt using abdominal breathing with speech. Min-mod cues at start of rote task to talk at top of breath, usually, faded to rare min A. When SLP progressed to simple questions pt began holding breath and answering, and taking in more air than with previous answers. SLP cued pt with visual cues to take a breath and talk at the top of the breath.  Eight throat clears in 38 minutes.   10/31/21: Pt has noticed with cold drinks it does not trigger sx of VCD. Ice cream does trigger throat clear/cough sx. Pt has practiced abdominal breathing (AB) consistently  since last session however is "getting woozy" between 10-13 minutes of practice. "Maybe because it's because I'm losing focus, I don't know." SLP worked with pt today on AB. She used AB for 17 minutes with "Maybe a little woozy? If it was it wasn't much at all." SLP suspects pt loses focus after 10 minutes AB and begins holding breath. She had 11 throat clears and one cough in 32 minutes (four throat clears and one cough in the first 9 minutes, prior to practicing AB). SLP again encouraged pt to consider gabapentin to further manage s/sx VCD.  10/24/21: Pt is noticing she is breathing with abdomen now during the day, at times. She is still having coughing spells but they are less frequent and last a shorter time. Throat clearing has also decr'd in frequency. Olivia Rubio is breathing with abdomen for 15 minutes BID and some shorter 5-10 minute segments. SLP highlighted the reduction in chronic cough/vocal  cord dysfunction sx with her frequency of practice with AB. Today pt had 12 clears in 14 minutes and 7 in 17 minutes (3 cough, 4 clears). SLP congratulated pt on her progress thus far.  At times pt was getting "just a little bit dizzy" so SLP had pt focus more on fluid transition from inhale to exhale, and taking a slightly longer inhale. This was intermittently successful. Pt to cont with at least 15 minutes AB BID, with as many 5-10 minute segments as possible also. If possible, she will begin to verbalize "mamamama" on exhale.   10/22/21: "You can't do those (throat clear compensations) around other people or you look like a nut." Pt has been performing hard "h" sound when she is not around others. The "scolding mmm" is too much like throat clearing and pt cannot perform it without doing a throat clear. The water as an alternative seems to facilitate more throat clearing (SLP wonders if temperature or sensation is part of the hypersensitivity - pt states colder or warmer things does not appear to change frequency  of clearing). Pt cont to say sweets (liquids and solids), and riding in a car triggers the incr in throat clearing; yesterday she did very little throat clearing and did not ride in a car. During this conversation of 3 minutes pt cleared 21 times. Pt stated she feels dizzy if she has >5 minutes of AB; When SLP had pt produce what she was doing at home she was holding breath for approx 1 second - SLP worked with pt to correct this. Afterwards pt and SLP worked with abdominal breathing (AB) in seated position in isolation. Pt with 95% success with AB, and 4 throat clears in 16 minutes. The next 4 minutes pt cleared her throat 11 times. SLP drew this to pt's attention including the 21 clears in 3 minutes and she stated that was a notable difference. SLP STRONGLY encouraged pt to practice AB at least 10 minutes, 2-3 times a day until next session.  Lastly pt noted tension in her throat during and after AB. SLP talked with pt about what to think of when pt notes she is going to have a coughing episode (AB, sniff-blow, relaxation strategies for larynx/pharynx). SLP practiced sniff-blow with pt - reluctant to do technique until SLP assured her it was socially acceptable.   10/10/21: SLP began with abdominal breathing (AB) education. Pt with 17 throat clears in 5 minutes working with AB, which is a drastic reduction compared to her evaluation. Pt attributes this to not eating yet this morning - SLP postulates this is possible, but a drastic reduction like that has much to do with getting tension/focus off of throat area. After 9 minutes of focus and conversation about AB, throat clears/coughing frequency slowly decr'd in frequency and counted as 33. SLP then educated pt about throat clearing alternatives and got pt to practice each one. With this 13 minute conversation, throat clearing/coughing gradually increased in frequency over time to 65 total during this time. Pt did not spontaneously use throat clearing  alternatives even though SLP assured pt could perform each correctly. SLP strongly encouraged pt to engage in throat clearing alternatives and reminded her how vocally abusive extensive throat clearing can be and also produces additional mucous. Pt demonstrated understanding.  10/08/21: SLP discussed results of exam and the framework for therapy going forward. SLP suggested pt contact her PCP or her GI MD to discuss the feasibility of re-starting regimen of Gabapentin given her current  symptoms reportedly appear to be increasing/more frequent and more severe (now pt has had throat closing sx in three straight months from April to June 2023).   PATIENT EDUCATION: Education details: see above in "today's treatment" Person educated: Patient Education method: Customer service manager Education comprehension: verbalized understanding, return demo   [place]: Not started yet. Will begin after first therapy session     GOALS: Goals reviewed with patient? Yes  SHORT TERM GOALS: Target date: 11/05/2021    Pt will demo abdominal breathing (AB) at rest 80% of the time for 5 minutes in 2 sessions Baseline:10-24-21 Goal status: Met  2.  Pt will demo abdominal breathing (AB) 70% with answers to simple questions in 2 sessions Baseline:  Goal status: Partially met  3.  Pt will decr throat clearing to <20 in 5 minutes Baseline:  Goal status: Met  4.  Pt will tell SLP benefits of throat clearing/coughing alternatives  Baseline:  Goal Status: Met    LONG TERM GOALS: Target date: 12/03/2021    Pt will demo AB 70% in 5 minutes simple/mod complex conversation in 2 sessions Baseline:  Goal status: not met  2.  Pt will decr throat clearing to <15 in 5 minutes Baseline:  Goal status: met, only when focusing solely on AB  3.  Pt will tell SLP the possible benefits of pharmacologic intervention for chronic cough Baseline:  Goal status: Met  4.  Olivia Rubio will score 30/60 or  lower on PROM the last 1-2 weeks of therapy Baseline:  Goal status: deferred - pt did not take PROM prior to d/c   ASSESSMENT:  CLINICAL IMPRESSION: Patient is a 66 y.o. female who was seen today for treatment of chronic throat clear/cough and mild hoarseness. SEE TX NOTE FOR MORE DETAILS. Pt has had change of heart in the last week and is open to gabapentin. She will seek out a referral to Dr. Chase Caller and inquire about this possibility. She will be d/c'd today from ST due to maxed potential at this time.  OBJECTIVE IMPAIRMENTS include voice disorder. These impairments are limiting patient from household responsibilities, ADLs/IADLs, and effectively communicating at home and in community. Factors affecting potential to achieve goals and functional outcome are cooperation/participation level and severity of impairments.. Patient will benefit from skilled SLP services to address above impairments and improve overall function.  REHAB POTENTIAL: Fair due to time post onset and pt's reluctance to pharmacological alternatives to decr/eliminate chronic throat clear/cough  PLAN: Discharge today  PLANNED INTERVENTIONS: Environmental controls, SLP instruction and feedback, Compensatory strategies, Patient/family education, and abdominal breathing education/practice    [place], CCC-SLP 11/28/2021, 10:25 AM

## 2021-12-13 ENCOUNTER — Other Ambulatory Visit (HOSPITAL_COMMUNITY): Payer: Self-pay

## 2021-12-17 ENCOUNTER — Other Ambulatory Visit (HOSPITAL_COMMUNITY): Payer: Self-pay

## 2021-12-18 ENCOUNTER — Other Ambulatory Visit (HOSPITAL_COMMUNITY): Payer: Self-pay

## 2021-12-18 MED ORDER — CELECOXIB 200 MG PO CAPS
200.0000 mg | ORAL_CAPSULE | Freq: Every day | ORAL | 3 refills | Status: DC
  Filled 2021-12-18: qty 30, 30d supply, fill #0
  Filled 2022-01-21: qty 30, 30d supply, fill #1
  Filled 2022-02-20: qty 30, 30d supply, fill #2
  Filled 2022-05-16: qty 30, 30d supply, fill #3

## 2021-12-18 MED ORDER — PREDNISONE 5 MG PO TABS
5.0000 mg | ORAL_TABLET | Freq: Every day | ORAL | 0 refills | Status: DC
  Filled 2021-12-18: qty 90, 90d supply, fill #0

## 2021-12-19 ENCOUNTER — Other Ambulatory Visit (HOSPITAL_COMMUNITY): Payer: Self-pay

## 2021-12-24 ENCOUNTER — Other Ambulatory Visit (HOSPITAL_COMMUNITY): Payer: Self-pay

## 2022-01-10 ENCOUNTER — Other Ambulatory Visit (HOSPITAL_COMMUNITY): Payer: Self-pay

## 2022-01-15 ENCOUNTER — Other Ambulatory Visit (HOSPITAL_COMMUNITY): Payer: Self-pay

## 2022-01-16 ENCOUNTER — Other Ambulatory Visit: Payer: Self-pay

## 2022-01-16 ENCOUNTER — Other Ambulatory Visit (HOSPITAL_COMMUNITY): Payer: Self-pay

## 2022-01-17 ENCOUNTER — Other Ambulatory Visit (HOSPITAL_COMMUNITY): Payer: Self-pay

## 2022-01-18 ENCOUNTER — Other Ambulatory Visit (HOSPITAL_COMMUNITY): Payer: Self-pay

## 2022-01-21 ENCOUNTER — Other Ambulatory Visit (HOSPITAL_COMMUNITY): Payer: Self-pay

## 2022-01-21 ENCOUNTER — Other Ambulatory Visit: Payer: Self-pay

## 2022-02-01 ENCOUNTER — Other Ambulatory Visit (HOSPITAL_COMMUNITY): Payer: Self-pay

## 2022-02-05 ENCOUNTER — Other Ambulatory Visit: Payer: Self-pay

## 2022-02-05 DIAGNOSIS — M810 Age-related osteoporosis without current pathological fracture: Secondary | ICD-10-CM | POA: Insufficient documentation

## 2022-02-08 ENCOUNTER — Telehealth: Payer: Self-pay | Admitting: Pharmacy Technician

## 2022-02-08 NOTE — Telephone Encounter (Signed)
Auth Submission: NO AUTH NEEDED Payer: [employer] Medication & CPT/J Code(s) submitted: Prolia (Denosumab) [id number] Route of submission (phone, fax, portal):  Phone # Fax # Auth type: Buy/Bill Units/visits requested: X2 DOSES Reference number: [facility] 02/08/22 @ 1:40p Approval from: 02/08/22 to 01/14/23

## 2022-02-11 ENCOUNTER — Other Ambulatory Visit (HOSPITAL_COMMUNITY): Payer: Self-pay

## 2022-02-11 MED ORDER — PROLIA 60 MG/ML ~~LOC~~ SOSY: PREFILLED_SYRINGE | SUBCUTANEOUS | 0 refills | Status: DC

## 2022-02-13 ENCOUNTER — Other Ambulatory Visit (HOSPITAL_COMMUNITY): Payer: Self-pay

## 2022-02-15 ENCOUNTER — Other Ambulatory Visit (HOSPITAL_COMMUNITY): Payer: Self-pay

## 2022-02-20 ENCOUNTER — Other Ambulatory Visit (HOSPITAL_COMMUNITY): Payer: Self-pay

## 2022-02-25 DIAGNOSIS — E669 Obesity, unspecified: Secondary | ICD-10-CM | POA: Diagnosis not present

## 2022-02-25 DIAGNOSIS — M8080XA Other osteoporosis with current pathological fracture, unspecified site, initial encounter for fracture: Secondary | ICD-10-CM | POA: Diagnosis not present

## 2022-02-25 DIAGNOSIS — M0589 Other rheumatoid arthritis with rheumatoid factor of multiple sites: Secondary | ICD-10-CM | POA: Diagnosis not present

## 2022-02-25 DIAGNOSIS — Z111 Encounter for screening for respiratory tuberculosis: Secondary | ICD-10-CM | POA: Diagnosis not present

## 2022-02-25 DIAGNOSIS — M545 Low back pain, unspecified: Secondary | ICD-10-CM | POA: Diagnosis not present

## 2022-02-25 DIAGNOSIS — M25512 Pain in left shoulder: Secondary | ICD-10-CM | POA: Diagnosis not present

## 2022-02-25 DIAGNOSIS — M1991 Primary osteoarthritis, unspecified site: Secondary | ICD-10-CM | POA: Diagnosis not present

## 2022-02-25 DIAGNOSIS — Z6831 Body mass index (BMI) 31.0-31.9, adult: Secondary | ICD-10-CM | POA: Diagnosis not present

## 2022-02-25 DIAGNOSIS — Z79899 Other long term (current) drug therapy: Secondary | ICD-10-CM | POA: Diagnosis not present

## 2022-02-25 DIAGNOSIS — G6289 Other specified polyneuropathies: Secondary | ICD-10-CM | POA: Diagnosis not present

## 2022-02-25 DIAGNOSIS — M542 Cervicalgia: Secondary | ICD-10-CM | POA: Diagnosis not present

## 2022-02-28 ENCOUNTER — Other Ambulatory Visit (HOSPITAL_COMMUNITY): Payer: Self-pay

## 2022-03-12 ENCOUNTER — Other Ambulatory Visit (HOSPITAL_COMMUNITY): Payer: Self-pay

## 2022-03-12 DIAGNOSIS — M816 Localized osteoporosis [Lequesne]: Secondary | ICD-10-CM | POA: Diagnosis not present

## 2022-03-12 DIAGNOSIS — M47896 Other spondylosis, lumbar region: Secondary | ICD-10-CM | POA: Diagnosis not present

## 2022-03-12 DIAGNOSIS — R29890 Loss of height: Secondary | ICD-10-CM | POA: Diagnosis not present

## 2022-03-12 DIAGNOSIS — Z124 Encounter for screening for malignant neoplasm of cervix: Secondary | ICD-10-CM | POA: Diagnosis not present

## 2022-03-12 DIAGNOSIS — Z7983 Long term (current) use of bisphosphonates: Secondary | ICD-10-CM | POA: Diagnosis not present

## 2022-03-12 DIAGNOSIS — Z1231 Encounter for screening mammogram for malignant neoplasm of breast: Secondary | ICD-10-CM | POA: Diagnosis not present

## 2022-03-12 DIAGNOSIS — N958 Other specified menopausal and perimenopausal disorders: Secondary | ICD-10-CM | POA: Diagnosis not present

## 2022-03-12 DIAGNOSIS — Z6831 Body mass index (BMI) 31.0-31.9, adult: Secondary | ICD-10-CM | POA: Diagnosis not present

## 2022-03-12 MED ORDER — PANTOPRAZOLE SODIUM 40 MG PO TBEC
40.0000 mg | DELAYED_RELEASE_TABLET | Freq: Two times a day (BID) | ORAL | 11 refills | Status: DC
  Filled 2022-03-12: qty 60, 30d supply, fill #0
  Filled 2022-09-07: qty 60, 30d supply, fill #1
  Filled 2023-01-17: qty 60, 30d supply, fill #2
  Filled 2023-02-14: qty 60, 30d supply, fill #3

## 2022-03-12 MED ORDER — HYDROCODONE-ACETAMINOPHEN 5-325 MG PO TABS
1.0000 | ORAL_TABLET | Freq: Two times a day (BID) | ORAL | 0 refills | Status: AC | PRN
  Filled 2022-03-12 – 2022-03-14 (×2): qty 30, 15d supply, fill #0

## 2022-03-13 ENCOUNTER — Encounter: Payer: Self-pay | Admitting: Endocrinology

## 2022-03-13 ENCOUNTER — Other Ambulatory Visit (HOSPITAL_COMMUNITY): Payer: Self-pay

## 2022-03-13 MED ORDER — PREDNISONE 5 MG PO TABS
5.0000 mg | ORAL_TABLET | Freq: Every day | ORAL | 0 refills | Status: DC
  Filled 2022-03-13: qty 90, 90d supply, fill #0

## 2022-03-14 ENCOUNTER — Other Ambulatory Visit (HOSPITAL_COMMUNITY): Payer: Self-pay

## 2022-03-14 ENCOUNTER — Encounter: Payer: Self-pay | Admitting: Endocrinology

## 2022-04-08 DIAGNOSIS — M25561 Pain in right knee: Secondary | ICD-10-CM | POA: Diagnosis not present

## 2022-04-30 DIAGNOSIS — H2513 Age-related nuclear cataract, bilateral: Secondary | ICD-10-CM | POA: Diagnosis not present

## 2022-05-16 ENCOUNTER — Other Ambulatory Visit (HOSPITAL_COMMUNITY): Payer: Self-pay

## 2022-06-05 ENCOUNTER — Other Ambulatory Visit (HOSPITAL_COMMUNITY): Payer: Self-pay

## 2022-06-05 MED ORDER — PREDNISONE 5 MG PO TABS
5.0000 mg | ORAL_TABLET | Freq: Every day | ORAL | 0 refills | Status: DC
  Filled 2022-06-05: qty 90, 90d supply, fill #0

## 2022-06-14 DIAGNOSIS — L821 Other seborrheic keratosis: Secondary | ICD-10-CM | POA: Diagnosis not present

## 2022-06-14 DIAGNOSIS — D485 Neoplasm of uncertain behavior of skin: Secondary | ICD-10-CM | POA: Diagnosis not present

## 2022-06-14 DIAGNOSIS — D2362 Other benign neoplasm of skin of left upper limb, including shoulder: Secondary | ICD-10-CM | POA: Diagnosis not present

## 2022-06-14 DIAGNOSIS — D2272 Melanocytic nevi of left lower limb, including hip: Secondary | ICD-10-CM | POA: Diagnosis not present

## 2022-06-14 DIAGNOSIS — D2271 Melanocytic nevi of right lower limb, including hip: Secondary | ICD-10-CM | POA: Diagnosis not present

## 2022-06-14 DIAGNOSIS — D1801 Hemangioma of skin and subcutaneous tissue: Secondary | ICD-10-CM | POA: Diagnosis not present

## 2022-06-14 DIAGNOSIS — Z85828 Personal history of other malignant neoplasm of skin: Secondary | ICD-10-CM | POA: Diagnosis not present

## 2022-06-14 DIAGNOSIS — L814 Other melanin hyperpigmentation: Secondary | ICD-10-CM | POA: Diagnosis not present

## 2022-06-14 DIAGNOSIS — D225 Melanocytic nevi of trunk: Secondary | ICD-10-CM | POA: Diagnosis not present

## 2022-06-27 ENCOUNTER — Other Ambulatory Visit (HOSPITAL_COMMUNITY): Payer: Self-pay

## 2022-06-27 IMAGING — US US THYROID
1 series · 13 of 25 positions shown · non-contrast
Comparison: 12/16/2017

CLINICAL DATA: Goiter. Left inferior thyroid nodule was biopsied on
05/05/2007.

EXAM:
THYROID ULTRASOUND
TECHNIQUE: Ultrasound examination of the thyroid gland and adjacent soft
tissues was performed.

[Series 1: us thyroid · 0.07mm/px · 13 of 41 slices shown]
[im 1/41]
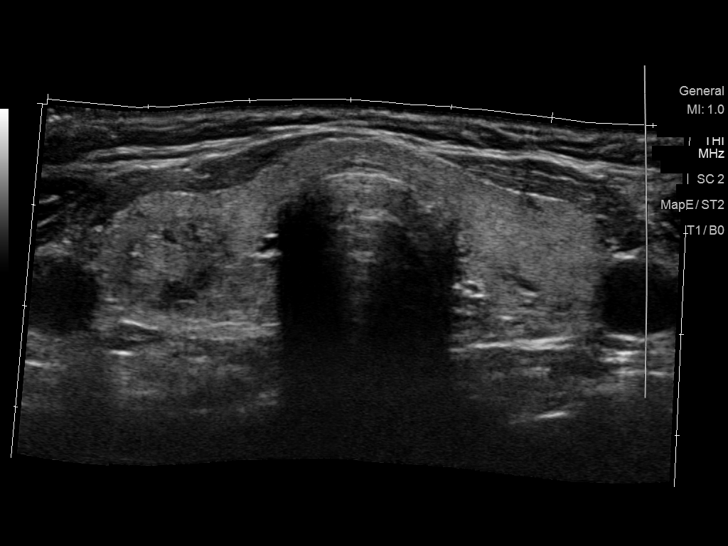
[im 4/41]
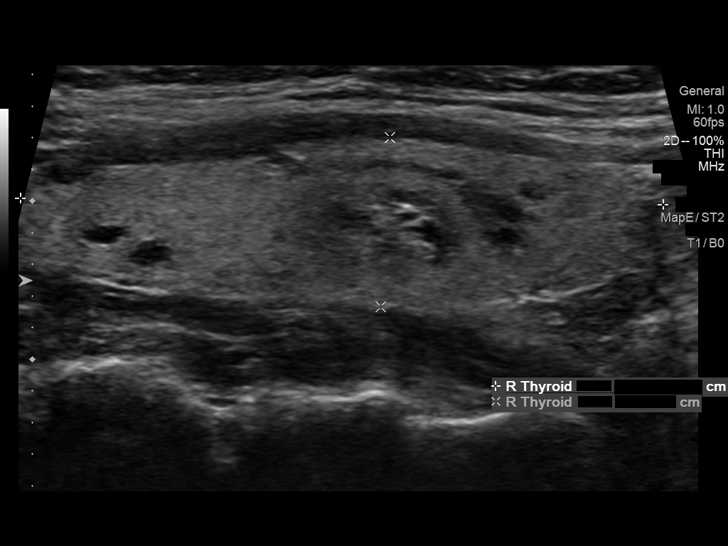
[im 7/41]
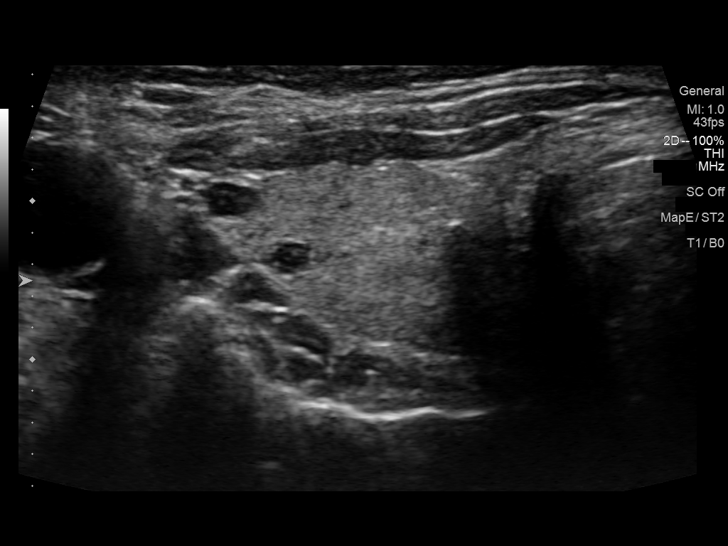
[im 11/41]
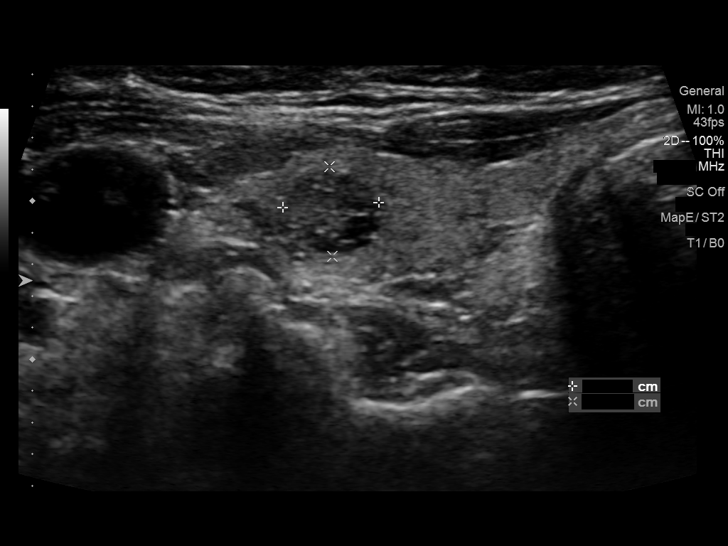
[im 14/41]
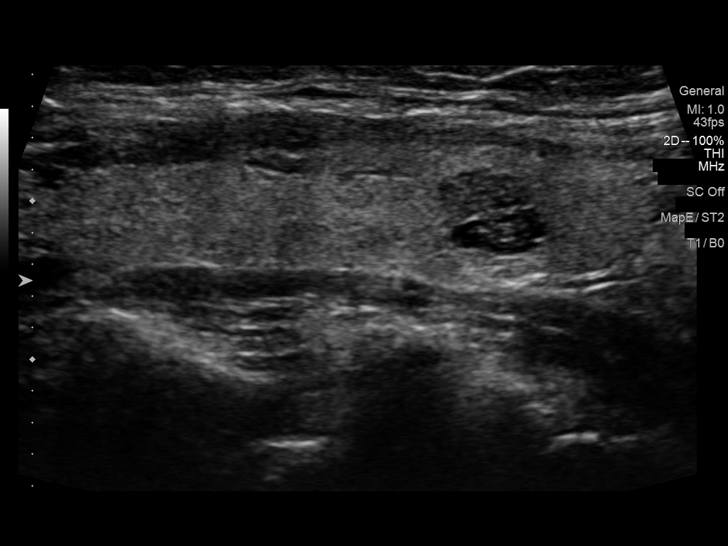
[im 17/41]
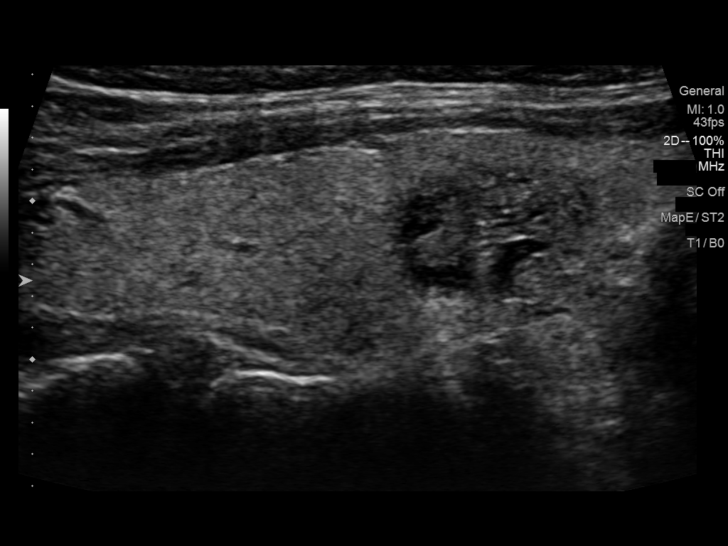
[im 21/41]
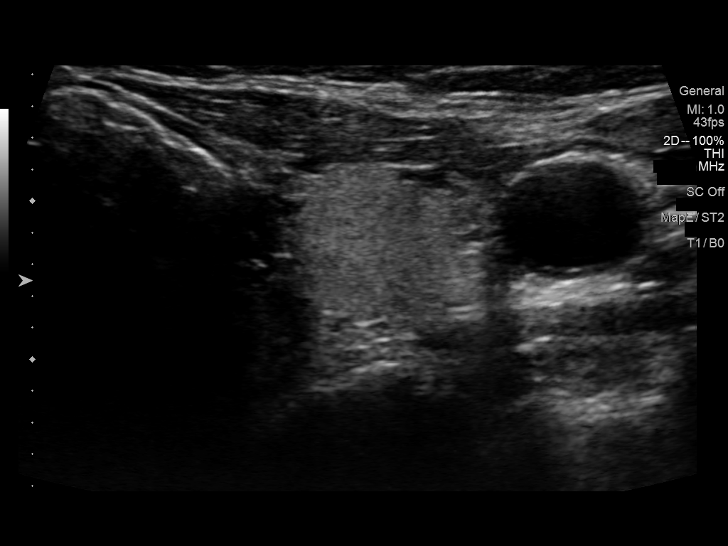
[im 24/41]
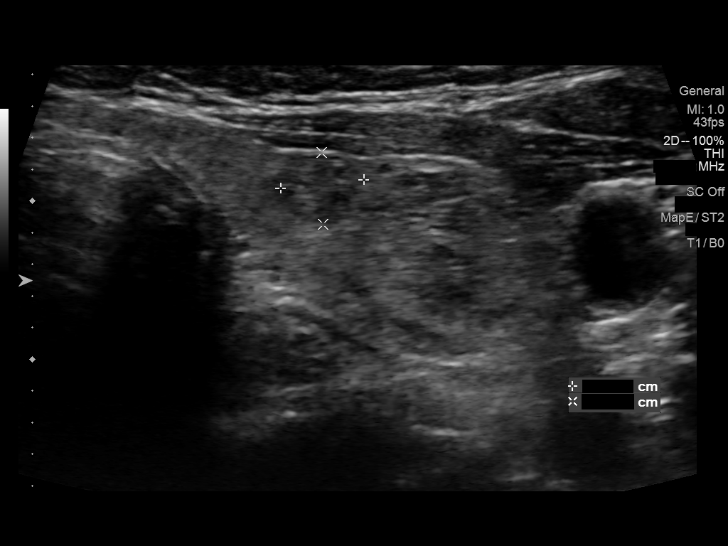
[im 27/41]
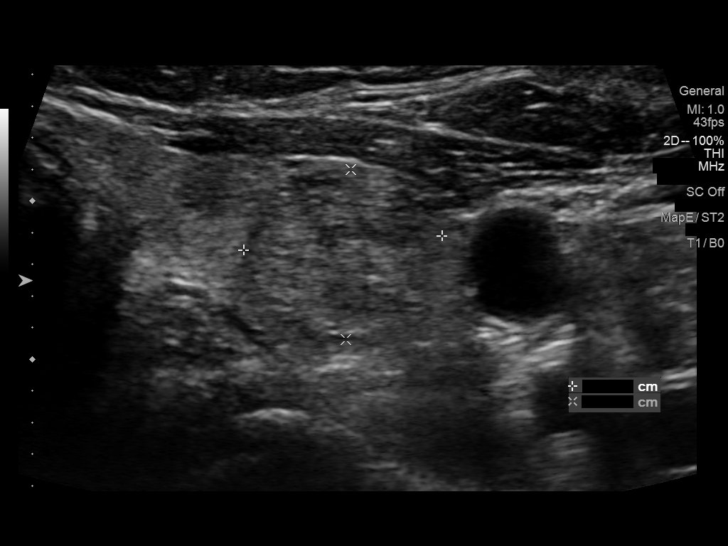
[im 31/41]
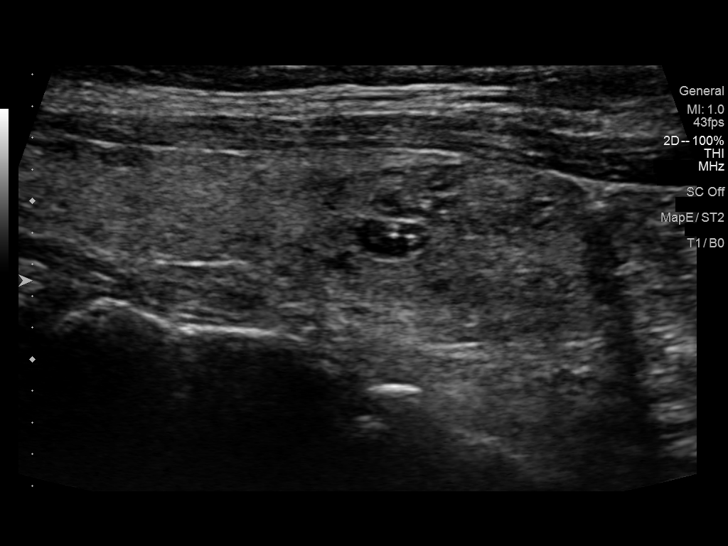
[im 34/41]
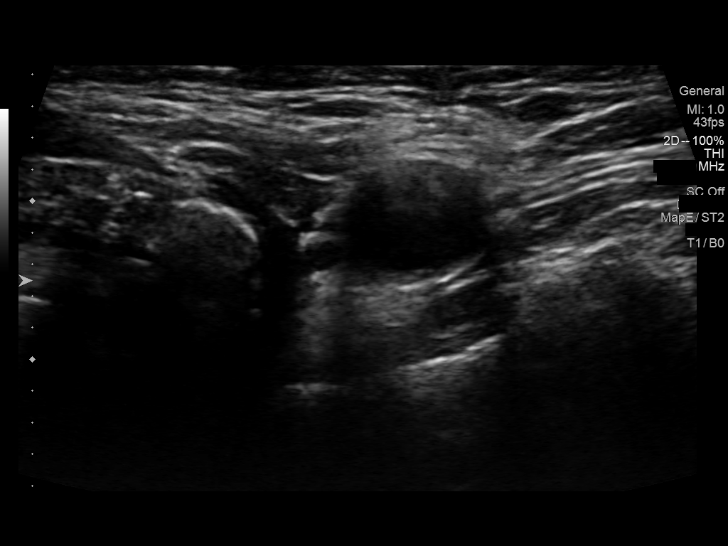
[im 37/41]
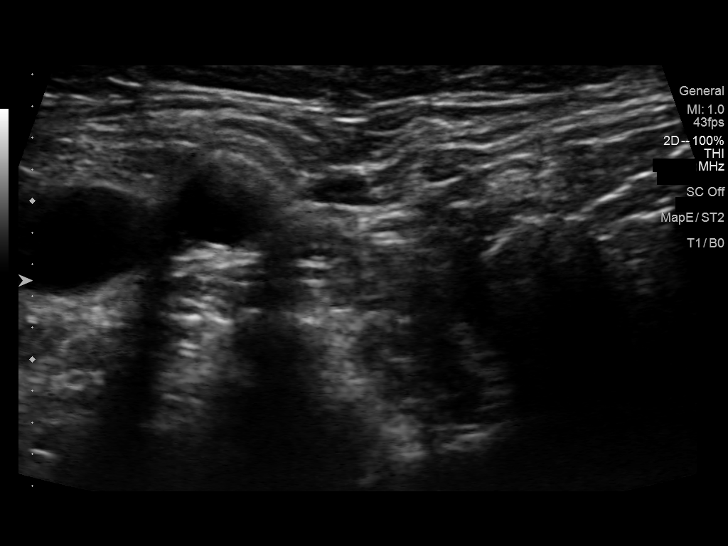
[im 41/41]
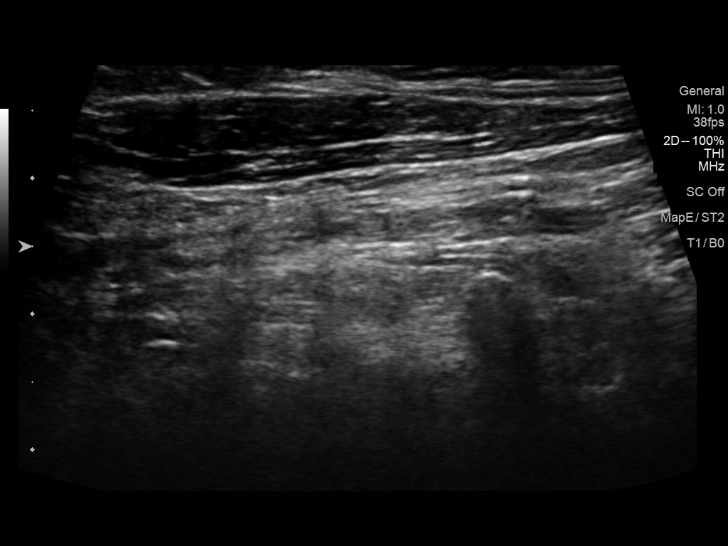

[13 of 25 positions shown; findings below may reference images not displayed]

FINDINGS: Parenchymal Echotexture: Mildly heterogenous

Isthmus: 0.2 cm, previously 0.2 cm

Right lobe: 4.1 x 1.1 x 1.8 cm, previously 4.8 x 1.2 x 2.1 cm

Left lobe: 4.8 x 1.2 x 1.8 cm, previously 4.8 x 1.0 x 1.6 cm

_________________________________________________________

Estimated total number of nodules >/= 1 cm: 2

Number of spongiform nodules >/=  2 cm not described below (TR1): 0

Number of mixed cystic and solid nodules >/= 1.5 cm not described
below (TR2): 0

_________________________________________________________

Nodule 1 is a mixed cystic and solid nodule in the right mid thyroid
lobe. Nodule 1 measures 1.5 x 0.9 x 1.4 cm and previously measured
1.2 x 0.8 x 1.0 cm. Solid component of this nodule is isoechoic.
Nodule does not meet criteria for biopsy or dedicated follow-up.

Nodule 2 is likely a spongiform nodule in the right inferior thyroid
lobe that measures 0.9 x 0.6 x 0.6 cm and previously measured 0.8 x
0.6 x 0.6 cm. This nodule does not meet criteria for biopsy or
dedicated follow-up.

Nodule 3 in the left inferior thyroid lobe is poorly defined and
slightly hypoechoic. This nodule measures up to 0.7 cm and does not
meet criteria for biopsy or dedicated follow-up.

Nodule 4 in the left inferior thyroid nodule is a solid isoechoic
nodule that measures 1.9 x 1.1 x 1.3 cm and previously measured
x 1.2 x 1.2 cm. This nodule was biopsied in 8999. Stable morphology
of this nodule.
IMPRESSION: 1. Thyroid tissue is mildly heterogeneous with scattered nodules.
2. Dominant thyroid nodules have minimally changed. No new
suspicious thyroid nodules.

The above is in keeping with the ACR TI-RADS recommendations - [HOSPITAL] 8145;[DATE].

## 2022-06-28 ENCOUNTER — Other Ambulatory Visit (HOSPITAL_COMMUNITY): Payer: Self-pay

## 2022-06-28 MED ORDER — CELECOXIB 200 MG PO CAPS
200.0000 mg | ORAL_CAPSULE | Freq: Every day | ORAL | 3 refills | Status: DC
  Filled 2022-06-28: qty 30, 30d supply, fill #0
  Filled 2022-08-12: qty 30, 30d supply, fill #1
  Filled 2022-09-07: qty 30, 30d supply, fill #2
  Filled 2022-10-17: qty 30, 30d supply, fill #3

## 2022-07-29 DIAGNOSIS — J358 Other chronic diseases of tonsils and adenoids: Secondary | ICD-10-CM | POA: Diagnosis not present

## 2022-08-10 ENCOUNTER — Other Ambulatory Visit (HOSPITAL_COMMUNITY): Payer: Self-pay

## 2022-08-10 DIAGNOSIS — M545 Low back pain, unspecified: Secondary | ICD-10-CM | POA: Diagnosis not present

## 2022-08-10 DIAGNOSIS — M1711 Unilateral primary osteoarthritis, right knee: Secondary | ICD-10-CM | POA: Diagnosis not present

## 2022-08-10 DIAGNOSIS — M25551 Pain in right hip: Secondary | ICD-10-CM | POA: Diagnosis not present

## 2022-08-10 MED ORDER — CYCLOBENZAPRINE HCL 5 MG PO TABS
5.0000 mg | ORAL_TABLET | Freq: Three times a day (TID) | ORAL | 1 refills | Status: AC | PRN
  Filled 2022-08-10 (×2): qty 30, 10d supply, fill #0

## 2022-08-10 MED ORDER — METHYLPREDNISOLONE 4 MG PO TBPK
ORAL_TABLET | ORAL | 0 refills | Status: AC
  Filled 2022-08-10 (×2): qty 21, 6d supply, fill #0

## 2022-08-12 ENCOUNTER — Other Ambulatory Visit (HOSPITAL_COMMUNITY): Payer: Self-pay

## 2022-08-12 MED ORDER — PREDNISONE 10 MG (21) PO TBPK
ORAL_TABLET | ORAL | 0 refills | Status: DC
  Filled 2022-08-12: qty 21, 6d supply, fill #0

## 2022-08-13 ENCOUNTER — Other Ambulatory Visit (HOSPITAL_COMMUNITY): Payer: Self-pay

## 2022-08-19 ENCOUNTER — Other Ambulatory Visit: Payer: Self-pay

## 2022-08-23 DIAGNOSIS — J358 Other chronic diseases of tonsils and adenoids: Secondary | ICD-10-CM | POA: Diagnosis not present

## 2022-08-23 DIAGNOSIS — K219 Gastro-esophageal reflux disease without esophagitis: Secondary | ICD-10-CM | POA: Diagnosis not present

## 2022-08-26 ENCOUNTER — Other Ambulatory Visit (HOSPITAL_COMMUNITY): Payer: Self-pay

## 2022-08-27 ENCOUNTER — Other Ambulatory Visit (HOSPITAL_COMMUNITY): Payer: Self-pay

## 2022-08-27 DIAGNOSIS — M0589 Other rheumatoid arthritis with rheumatoid factor of multiple sites: Secondary | ICD-10-CM | POA: Diagnosis not present

## 2022-08-27 DIAGNOSIS — I10 Essential (primary) hypertension: Secondary | ICD-10-CM | POA: Diagnosis not present

## 2022-08-27 DIAGNOSIS — K219 Gastro-esophageal reflux disease without esophagitis: Secondary | ICD-10-CM | POA: Diagnosis not present

## 2022-08-27 DIAGNOSIS — I700 Atherosclerosis of aorta: Secondary | ICD-10-CM | POA: Diagnosis not present

## 2022-08-27 DIAGNOSIS — G6289 Other specified polyneuropathies: Secondary | ICD-10-CM | POA: Diagnosis not present

## 2022-08-27 DIAGNOSIS — M8080XA Other osteoporosis with current pathological fracture, unspecified site, initial encounter for fracture: Secondary | ICD-10-CM | POA: Diagnosis not present

## 2022-08-27 DIAGNOSIS — Z79899 Other long term (current) drug therapy: Secondary | ICD-10-CM | POA: Diagnosis not present

## 2022-08-27 DIAGNOSIS — M25512 Pain in left shoulder: Secondary | ICD-10-CM | POA: Diagnosis not present

## 2022-08-27 DIAGNOSIS — Z0001 Encounter for general adult medical examination with abnormal findings: Secondary | ICD-10-CM | POA: Diagnosis not present

## 2022-08-27 DIAGNOSIS — Z6832 Body mass index (BMI) 32.0-32.9, adult: Secondary | ICD-10-CM | POA: Diagnosis not present

## 2022-08-27 DIAGNOSIS — E669 Obesity, unspecified: Secondary | ICD-10-CM | POA: Diagnosis not present

## 2022-08-27 DIAGNOSIS — D696 Thrombocytopenia, unspecified: Secondary | ICD-10-CM | POA: Diagnosis not present

## 2022-08-27 DIAGNOSIS — M545 Low back pain, unspecified: Secondary | ICD-10-CM | POA: Diagnosis not present

## 2022-08-27 DIAGNOSIS — D84821 Immunodeficiency due to drugs: Secondary | ICD-10-CM | POA: Diagnosis not present

## 2022-08-27 DIAGNOSIS — M1991 Primary osteoarthritis, unspecified site: Secondary | ICD-10-CM | POA: Diagnosis not present

## 2022-08-27 DIAGNOSIS — M069 Rheumatoid arthritis, unspecified: Secondary | ICD-10-CM | POA: Diagnosis not present

## 2022-08-27 DIAGNOSIS — M542 Cervicalgia: Secondary | ICD-10-CM | POA: Diagnosis not present

## 2022-08-27 DIAGNOSIS — M8000XA Age-related osteoporosis with current pathological fracture, unspecified site, initial encounter for fracture: Secondary | ICD-10-CM | POA: Diagnosis not present

## 2022-08-27 DIAGNOSIS — E049 Nontoxic goiter, unspecified: Secondary | ICD-10-CM | POA: Diagnosis not present

## 2022-08-27 MED ORDER — PREDNISONE 5 MG PO TABS
5.0000 mg | ORAL_TABLET | Freq: Every day | ORAL | 0 refills | Status: DC
  Filled 2022-08-27: qty 90, 90d supply, fill #0

## 2022-08-30 DIAGNOSIS — M25562 Pain in left knee: Secondary | ICD-10-CM | POA: Diagnosis not present

## 2022-08-30 DIAGNOSIS — M6281 Muscle weakness (generalized): Secondary | ICD-10-CM | POA: Diagnosis not present

## 2022-08-30 DIAGNOSIS — M5459 Other low back pain: Secondary | ICD-10-CM | POA: Diagnosis not present

## 2022-08-30 DIAGNOSIS — M25561 Pain in right knee: Secondary | ICD-10-CM | POA: Diagnosis not present

## 2022-09-04 DIAGNOSIS — M5459 Other low back pain: Secondary | ICD-10-CM | POA: Diagnosis not present

## 2022-09-04 DIAGNOSIS — M25562 Pain in left knee: Secondary | ICD-10-CM | POA: Diagnosis not present

## 2022-09-04 DIAGNOSIS — M6281 Muscle weakness (generalized): Secondary | ICD-10-CM | POA: Diagnosis not present

## 2022-09-04 DIAGNOSIS — M25561 Pain in right knee: Secondary | ICD-10-CM | POA: Diagnosis not present

## 2022-09-06 DIAGNOSIS — M6281 Muscle weakness (generalized): Secondary | ICD-10-CM | POA: Diagnosis not present

## 2022-09-06 DIAGNOSIS — M25562 Pain in left knee: Secondary | ICD-10-CM | POA: Diagnosis not present

## 2022-09-06 DIAGNOSIS — M5459 Other low back pain: Secondary | ICD-10-CM | POA: Diagnosis not present

## 2022-09-06 DIAGNOSIS — M25561 Pain in right knee: Secondary | ICD-10-CM | POA: Diagnosis not present

## 2022-09-07 ENCOUNTER — Other Ambulatory Visit (HOSPITAL_COMMUNITY): Payer: Self-pay

## 2022-09-11 DIAGNOSIS — M25562 Pain in left knee: Secondary | ICD-10-CM | POA: Diagnosis not present

## 2022-09-11 DIAGNOSIS — M25561 Pain in right knee: Secondary | ICD-10-CM | POA: Diagnosis not present

## 2022-09-11 DIAGNOSIS — M5459 Other low back pain: Secondary | ICD-10-CM | POA: Diagnosis not present

## 2022-09-11 DIAGNOSIS — M6281 Muscle weakness (generalized): Secondary | ICD-10-CM | POA: Diagnosis not present

## 2022-09-12 DIAGNOSIS — M25561 Pain in right knee: Secondary | ICD-10-CM | POA: Diagnosis not present

## 2022-09-12 DIAGNOSIS — M25562 Pain in left knee: Secondary | ICD-10-CM | POA: Diagnosis not present

## 2022-09-13 DIAGNOSIS — M25561 Pain in right knee: Secondary | ICD-10-CM | POA: Diagnosis not present

## 2022-09-13 DIAGNOSIS — M5459 Other low back pain: Secondary | ICD-10-CM | POA: Diagnosis not present

## 2022-09-13 DIAGNOSIS — M25562 Pain in left knee: Secondary | ICD-10-CM | POA: Diagnosis not present

## 2022-09-13 DIAGNOSIS — M6281 Muscle weakness (generalized): Secondary | ICD-10-CM | POA: Diagnosis not present

## 2022-09-17 DIAGNOSIS — M25561 Pain in right knee: Secondary | ICD-10-CM | POA: Diagnosis not present

## 2022-09-17 DIAGNOSIS — M5459 Other low back pain: Secondary | ICD-10-CM | POA: Diagnosis not present

## 2022-09-17 DIAGNOSIS — M25562 Pain in left knee: Secondary | ICD-10-CM | POA: Diagnosis not present

## 2022-09-17 DIAGNOSIS — M6281 Muscle weakness (generalized): Secondary | ICD-10-CM | POA: Diagnosis not present

## 2022-09-20 DIAGNOSIS — M5459 Other low back pain: Secondary | ICD-10-CM | POA: Diagnosis not present

## 2022-09-20 DIAGNOSIS — M25561 Pain in right knee: Secondary | ICD-10-CM | POA: Diagnosis not present

## 2022-09-20 DIAGNOSIS — M25562 Pain in left knee: Secondary | ICD-10-CM | POA: Diagnosis not present

## 2022-09-20 DIAGNOSIS — M6281 Muscle weakness (generalized): Secondary | ICD-10-CM | POA: Diagnosis not present

## 2022-09-24 DIAGNOSIS — M25561 Pain in right knee: Secondary | ICD-10-CM | POA: Diagnosis not present

## 2022-09-24 DIAGNOSIS — M5459 Other low back pain: Secondary | ICD-10-CM | POA: Diagnosis not present

## 2022-09-24 DIAGNOSIS — M6281 Muscle weakness (generalized): Secondary | ICD-10-CM | POA: Diagnosis not present

## 2022-09-24 DIAGNOSIS — M25562 Pain in left knee: Secondary | ICD-10-CM | POA: Diagnosis not present

## 2022-09-29 DIAGNOSIS — M25562 Pain in left knee: Secondary | ICD-10-CM | POA: Diagnosis not present

## 2022-09-29 DIAGNOSIS — M25561 Pain in right knee: Secondary | ICD-10-CM | POA: Diagnosis not present

## 2022-10-17 ENCOUNTER — Other Ambulatory Visit (HOSPITAL_COMMUNITY): Payer: Self-pay

## 2022-10-18 ENCOUNTER — Other Ambulatory Visit (HOSPITAL_COMMUNITY): Payer: Self-pay

## 2022-10-18 DIAGNOSIS — M170 Bilateral primary osteoarthritis of knee: Secondary | ICD-10-CM | POA: Diagnosis not present

## 2022-10-25 ENCOUNTER — Other Ambulatory Visit (HOSPITAL_COMMUNITY): Payer: Self-pay

## 2022-10-25 ENCOUNTER — Encounter (HOSPITAL_COMMUNITY): Payer: Self-pay

## 2022-10-25 MED ORDER — PREDNISONE 10 MG (21) PO TBPK
ORAL_TABLET | ORAL | 0 refills | Status: AC
  Filled 2022-10-25: qty 21, 6d supply, fill #0

## 2022-10-25 MED ORDER — METHYLPREDNISOLONE 4 MG PO TBPK
ORAL_TABLET | ORAL | 0 refills | Status: DC
  Filled 2022-10-25: qty 21, 6d supply, fill #0

## 2022-10-28 DIAGNOSIS — M170 Bilateral primary osteoarthritis of knee: Secondary | ICD-10-CM | POA: Diagnosis not present

## 2022-10-30 DIAGNOSIS — H2513 Age-related nuclear cataract, bilateral: Secondary | ICD-10-CM | POA: Diagnosis not present

## 2022-10-30 DIAGNOSIS — G43B0 Ophthalmoplegic migraine, not intractable: Secondary | ICD-10-CM | POA: Diagnosis not present

## 2022-11-04 DIAGNOSIS — M170 Bilateral primary osteoarthritis of knee: Secondary | ICD-10-CM | POA: Diagnosis not present

## 2022-11-12 DIAGNOSIS — M170 Bilateral primary osteoarthritis of knee: Secondary | ICD-10-CM | POA: Diagnosis not present

## 2022-11-14 DIAGNOSIS — M810 Age-related osteoporosis without current pathological fracture: Secondary | ICD-10-CM | POA: Diagnosis not present

## 2022-11-14 DIAGNOSIS — Z7952 Long term (current) use of systemic steroids: Secondary | ICD-10-CM | POA: Diagnosis not present

## 2022-11-14 DIAGNOSIS — E049 Nontoxic goiter, unspecified: Secondary | ICD-10-CM | POA: Diagnosis not present

## 2022-11-14 DIAGNOSIS — E559 Vitamin D deficiency, unspecified: Secondary | ICD-10-CM | POA: Diagnosis not present

## 2022-11-14 DIAGNOSIS — E213 Hyperparathyroidism, unspecified: Secondary | ICD-10-CM | POA: Diagnosis not present

## 2022-11-19 ENCOUNTER — Other Ambulatory Visit (HOSPITAL_COMMUNITY): Payer: Self-pay

## 2022-11-19 ENCOUNTER — Other Ambulatory Visit: Payer: Self-pay

## 2022-11-19 MED ORDER — PREDNISONE 5 MG PO TABS
5.0000 mg | ORAL_TABLET | Freq: Every day | ORAL | 0 refills | Status: DC
  Filled 2022-11-19: qty 90, 90d supply, fill #0

## 2022-11-19 MED ORDER — CELECOXIB 200 MG PO CAPS
200.0000 mg | ORAL_CAPSULE | Freq: Every day | ORAL | 3 refills | Status: DC
  Filled 2022-11-19: qty 30, 30d supply, fill #0
  Filled 2022-12-20: qty 30, 30d supply, fill #1
  Filled 2023-01-17: qty 30, 30d supply, fill #2
  Filled 2023-02-14: qty 30, 30d supply, fill #3

## 2022-11-26 ENCOUNTER — Emergency Department: Payer: PPO

## 2022-11-26 ENCOUNTER — Other Ambulatory Visit: Payer: Self-pay

## 2022-11-26 ENCOUNTER — Emergency Department
Admission: EM | Admit: 2022-11-26 | Discharge: 2022-11-26 | Disposition: A | Discharge status: Home / Self Care | Payer: PPO | Attending: Emergency Medicine | Admitting: Emergency Medicine

## 2022-11-26 ENCOUNTER — Other Ambulatory Visit (HOSPITAL_COMMUNITY): Payer: Self-pay

## 2022-11-26 ENCOUNTER — Encounter (HOSPITAL_COMMUNITY): Payer: Self-pay

## 2022-11-26 DIAGNOSIS — M7912 Myalgia of auxiliary muscles, head and neck: Secondary | ICD-10-CM

## 2022-11-26 DIAGNOSIS — E039 Hypothyroidism, unspecified: Secondary | ICD-10-CM | POA: Insufficient documentation

## 2022-11-26 DIAGNOSIS — R0789 Other chest pain: Secondary | ICD-10-CM | POA: Diagnosis not present

## 2022-11-26 DIAGNOSIS — M542 Cervicalgia: Secondary | ICD-10-CM | POA: Diagnosis not present

## 2022-11-26 DIAGNOSIS — R079 Chest pain, unspecified: Secondary | ICD-10-CM | POA: Diagnosis not present

## 2022-11-26 LAB — CBC
HCT: 47.3 % — ABNORMAL HIGH (ref 36.0–46.0)
Hemoglobin: 16.2 g/dL — ABNORMAL HIGH (ref 12.0–15.0)
MCH: 32.3 pg (ref 26.0–34.0)
MCHC: 34.2 g/dL (ref 30.0–36.0)
MCV: 94.4 fL (ref 80.0–100.0)
Platelets: 160 10*3/uL (ref 150–400)
RBC: 5.01 MIL/uL (ref 3.87–5.11)
RDW: 11.9 % (ref 11.5–15.5)
WBC: 5.6 10*3/uL (ref 4.0–10.5)
nRBC: 0 % (ref 0.0–0.2)

## 2022-11-26 LAB — BASIC METABOLIC PANEL
Anion gap: 9 (ref 5–15)
BUN: 16 mg/dL (ref 8–23)
CO2: 24 mmol/L (ref 22–32)
Calcium: 9.6 mg/dL (ref 8.9–10.3)
Chloride: 104 mmol/L (ref 98–111)
Creatinine, Ser: 0.68 mg/dL (ref 0.44–1.00)
GFR, Estimated: >60 mL/min (ref >60)
Glucose, Bld: 93 mg/dL (ref 70–99)
Potassium: 3.5 mmol/L (ref 3.5–5.1)
Sodium: 137 mmol/L (ref 135–145)

## 2022-11-26 LAB — TROPONIN I (HIGH SENSITIVITY): Troponin I (High Sensitivity): 4 ng/L (ref <18)

## 2022-11-26 MED ORDER — ONDANSETRON 4 MG PO TBDP
4.0000 mg | ORAL_TABLET | Freq: Three times a day (TID) | ORAL | 0 refills | Status: AC | PRN
  Filled 2022-11-26: qty 15, 5d supply, fill #0

## 2022-11-26 MED ORDER — METAXALONE 800 MG PO TABS
800.0000 mg | ORAL_TABLET | Freq: Three times a day (TID) | ORAL | 0 refills | Status: AC
  Filled 2022-11-26: qty 30, 10d supply, fill #0

## 2022-11-26 NOTE — ED Provider Notes (Signed)
[facility] Emergency Department Provider Note     Event Date/Time   First MD Initiated Contact with Patient 11/26/22 1539     (approximate)   History   Neck Pain   HPI  Olivia Rubio is a 67 y.o. female history of hypothyroidism, reflux, osteoarthritis, and status post hysterectomy, presents to the ED for evaluation of left-sided neck pain.  She reports radiation into her shoulder and chest that started about an hour prior to arrival patient denies any central chest pain, nausea, vomiting, or diaphoresis.  Patient denies any cough, congestion, or sore throat.  She also denies any recent injury, trauma, falls. Denies any prior cardiac history.  Physical Exam   Triage Vital Signs: ED Triage Vitals [11/26/22 1335]  Encounter Vitals Group     BP (!) 143/70     Systolic BP Percentile      Diastolic BP Percentile      Pulse Rate 92     Resp 17     Temp 97.9 F (36.6 C)     Temp Source Oral     SpO2 98 %     Weight      Height      Head Circumference      Peak Flow      Pain Score 7     Pain Loc      Pain Education      Exclude from Growth Chart     Most recent vital signs: Vitals:   11/26/22 1335  BP: (!) 143/70  Pulse: 92  Resp: 17  Temp: 97.9 F (36.6 C)  SpO2: 98%    General Awake, no distress. NAD HEENT NCAT. PERRL. EOMI. No rhinorrhea. Mucous membranes are moist.  CV:  Good peripheral perfusion. RRR RESP:  Normal effort. CTA ABD:  No distention.    ED Results / Procedures / Treatments   Labs (all labs ordered are listed, but only abnormal results are displayed) Labs Reviewed  CBC - Abnormal; Notable for the following components:      Result Value   Hemoglobin 16.2 (*)    HCT 47.3 (*)    All other components within normal limits  BASIC METABOLIC PANEL  TROPONIN I (HIGH SENSITIVITY)     EKG  Vent. rate 87 BPM PR interval 156 ms QRS duration 78 ms QT/QTcB 384/462 ms P-R-T axes 70 36 87 Normal sinus  rhythm Cannot rule out Anterior infarct , age undetermined Abnormal ECG When compared with ECG of 24-Jan-2013 07:57, Nonspecific T wave abnormality no longer evident in Inferior leads Nonspecific T wave abnormality now evident in Lateral leads No STEMI  RADIOLOGY  I personally viewed and evaluated these images as part of my medical decision making, as well as reviewing the written report by the radiologist.  ED Provider Interpretation: No acute findings  DG CXR  IMPRESSION: No active cardiopulmonary disease.  PROCEDURES:  Critical Care performed: No  Procedures   MEDICATIONS ORDERED IN ED: Medications - No data to display   IMPRESSION / MDM / ASSESSMENT AND PLAN / ED COURSE  I reviewed the triage vital signs and the nursing notes.                              Differential diagnosis includes, but is not limited to,  ACS, aortic dissection, pulmonary embolism, cardiac tamponade, pneumothorax, pneumonia, pericarditis, myocarditis, GI-related causes including esophagitis/gastritis, and musculoskeletal chest wall pain.  Patient's presentation is most consistent with acute complicated illness / injury requiring diagnostic workup.  Patient's diagnosis is consistent with nonspecific chest pain on presentation.  Patient with reassuring exam and workup at this time.  Low suspicion for ACS, as symptoms appear to be reproducible on exam.  Patient with a normal workup at this time including troponin x 2.  No lab abnormalities including critical anemia or acute leukocytosis.  EKG without evidence of malignant arrhythmia.  Patient is reassured by her workup at this time.  Patient will be discharged home with prescriptions for metaxalone and ondansetron. Patient is to follow up with PCP as discussed, as needed or otherwise directed. Patient is given ED precautions to return to the ED for any worsening or new symptoms.   FINAL CLINICAL IMPRESSION(S) / ED DIAGNOSES   Final diagnoses:   Sternocleidomastoid muscle tenderness  Chest pain, unspecified type     Rx / DC Orders   ED Discharge Orders          Ordered    metaxalone (SKELAXIN) 800 MG tablet  3 times daily        11/26/22 1733    ondansetron (ZOFRAN-ODT) 4 MG disintegrating tablet  Every 8 hours PRN        11/26/22 1733             Note:  This document was prepared using Dragon voice recognition software and may include unintentional dictation errors.    Lissa Hoard, PA-C 11/28/22 2356    Jene Every, MD 11/29/22 [phone]

## 2022-11-26 NOTE — Discharge Instructions (Addendum)
Your exam, labs, CXR, and EKG are normal and reassuring. There is no evidence of any serious cardiac cause for your symptoms. You appear to have a reproducible musculoskeletal source. Take the prescription meds along with your home meds as needed. Follow-up with your primary provider as discussed.

## 2022-11-26 NOTE — ED Notes (Signed)
See triage notes. Patient c/o chest pain/indigestion that started this morning. Patient also has left shoulder and neck pain.

## 2022-11-26 NOTE — ED Triage Notes (Signed)
Pt here with left sided neck pain that radiates to her shoulder and chest that started about an hour ago. Pt denies central cp. Pt states the pain is worse with movement.  Pt denies fall or injury.

## 2022-11-27 ENCOUNTER — Other Ambulatory Visit: Payer: Self-pay

## 2022-11-27 DIAGNOSIS — M0589 Other rheumatoid arthritis with rheumatoid factor of multiple sites: Secondary | ICD-10-CM | POA: Diagnosis not present

## 2022-12-17 ENCOUNTER — Other Ambulatory Visit: Payer: Self-pay

## 2022-12-17 ENCOUNTER — Other Ambulatory Visit (HOSPITAL_COMMUNITY): Payer: Self-pay

## 2022-12-17 DIAGNOSIS — M069 Rheumatoid arthritis, unspecified: Secondary | ICD-10-CM | POA: Diagnosis not present

## 2022-12-17 DIAGNOSIS — M62838 Other muscle spasm: Secondary | ICD-10-CM | POA: Diagnosis not present

## 2022-12-17 MED ORDER — NAPROXEN 500 MG PO TBEC
500.0000 mg | DELAYED_RELEASE_TABLET | Freq: Two times a day (BID) | ORAL | 0 refills | Status: AC
  Filled 2022-12-17: qty 14, 7d supply, fill #0

## 2022-12-17 MED ORDER — METHOCARBAMOL 500 MG PO TABS
500.0000 mg | ORAL_TABLET | Freq: Four times a day (QID) | ORAL | 0 refills | Status: DC
  Filled 2022-12-17: qty 30, 8d supply, fill #0

## 2022-12-20 ENCOUNTER — Other Ambulatory Visit (HOSPITAL_COMMUNITY): Payer: Self-pay

## 2022-12-27 ENCOUNTER — Other Ambulatory Visit (HOSPITAL_COMMUNITY): Payer: Self-pay

## 2022-12-27 DIAGNOSIS — M170 Bilateral primary osteoarthritis of knee: Secondary | ICD-10-CM | POA: Diagnosis not present

## 2022-12-27 MED ORDER — MELOXICAM 15 MG PO TABS
15.0000 mg | ORAL_TABLET | Freq: Every day | ORAL | 3 refills | Status: AC
  Filled 2022-12-27: qty 30, 30d supply, fill #0
  Filled 2023-03-15: qty 30, 30d supply, fill #1
  Filled 2023-05-03: qty 30, 30d supply, fill #2

## 2023-01-17 ENCOUNTER — Other Ambulatory Visit (HOSPITAL_COMMUNITY): Payer: Self-pay

## 2023-01-27 ENCOUNTER — Other Ambulatory Visit (HOSPITAL_COMMUNITY): Payer: Self-pay

## 2023-01-27 ENCOUNTER — Other Ambulatory Visit: Payer: Self-pay

## 2023-01-27 MED ORDER — AMOXICILLIN-POT CLAVULANATE 875-125 MG PO TABS
1.0000 | ORAL_TABLET | Freq: Two times a day (BID) | ORAL | 0 refills | Status: DC
  Filled 2023-01-27 (×2): qty 20, 10d supply, fill #0

## 2023-02-14 ENCOUNTER — Other Ambulatory Visit (HOSPITAL_COMMUNITY): Payer: Self-pay

## 2023-02-14 MED ORDER — PREDNISONE 5 MG PO TABS
5.0000 mg | ORAL_TABLET | Freq: Every day | ORAL | 0 refills | Status: DC
  Filled 2023-02-14: qty 90, 90d supply, fill #0

## 2023-02-21 NOTE — Progress Notes (Signed)
 Patient has an appt with Dr. Linnell Richardson at [facility].  Manual BP 150/70.  Pt is currently not taking any BP medicine.

## 2023-02-27 DIAGNOSIS — M0589 Other rheumatoid arthritis with rheumatoid factor of multiple sites: Secondary | ICD-10-CM | POA: Diagnosis not present

## 2023-03-04 ENCOUNTER — Other Ambulatory Visit: Payer: Self-pay

## 2023-03-04 MED ORDER — AMOXICILLIN 500 MG PO CAPS
500.0000 mg | ORAL_CAPSULE | Freq: Three times a day (TID) | ORAL | 0 refills | Status: AC
  Filled 2023-03-04: qty 21, 7d supply, fill #0

## 2023-03-15 ENCOUNTER — Other Ambulatory Visit (HOSPITAL_COMMUNITY): Payer: Self-pay

## 2023-03-17 ENCOUNTER — Other Ambulatory Visit (HOSPITAL_COMMUNITY): Payer: Self-pay

## 2023-03-17 MED ORDER — CELECOXIB 200 MG PO CAPS
200.0000 mg | ORAL_CAPSULE | Freq: Every day | ORAL | 3 refills | Status: AC
  Filled 2023-03-17: qty 30, 30d supply, fill #0
  Filled 2023-05-03: qty 30, 30d supply, fill #1
  Filled 2023-06-12: qty 30, 30d supply, fill #2
  Filled 2023-08-10: qty 30, 30d supply, fill #3

## 2023-04-04 ENCOUNTER — Other Ambulatory Visit (HOSPITAL_COMMUNITY): Payer: Self-pay

## 2023-04-04 DIAGNOSIS — Z1231 Encounter for screening mammogram for malignant neoplasm of breast: Secondary | ICD-10-CM | POA: Diagnosis not present

## 2023-04-04 DIAGNOSIS — Z6835 Body mass index (BMI) 35.0-35.9, adult: Secondary | ICD-10-CM | POA: Diagnosis not present

## 2023-04-04 DIAGNOSIS — Z01419 Encounter for gynecological examination (general) (routine) without abnormal findings: Secondary | ICD-10-CM | POA: Diagnosis not present

## 2023-04-04 MED ORDER — PANTOPRAZOLE SODIUM 40 MG PO TBEC
40.0000 mg | DELAYED_RELEASE_TABLET | Freq: Two times a day (BID) | ORAL | 11 refills | Status: AC
  Filled 2023-04-04: qty 60, 30d supply, fill #0
  Filled 2023-06-12: qty 60, 30d supply, fill #1
  Filled 2023-08-10: qty 60, 30d supply, fill #2
  Filled 2023-09-09: qty 60, 30d supply, fill #3
  Filled 2023-10-10: qty 60, 30d supply, fill #4
  Filled 2023-11-10: qty 60, 30d supply, fill #5
  Filled 2023-12-08: qty 60, 30d supply, fill #6
  Filled 2023-12-29 – 2023-12-31 (×5): qty 60, 30d supply, fill #7
  Filled 2024-02-13: qty 60, 30d supply, fill #8

## 2023-04-08 ENCOUNTER — Other Ambulatory Visit: Payer: Self-pay

## 2023-04-08 DIAGNOSIS — H6983 Other specified disorders of Eustachian tube, bilateral: Secondary | ICD-10-CM | POA: Diagnosis not present

## 2023-04-08 DIAGNOSIS — H9313 Tinnitus, bilateral: Secondary | ICD-10-CM | POA: Diagnosis not present

## 2023-04-08 MED ORDER — FLUTICASONE PROPIONATE 50 MCG/ACT NA SUSP
1.0000 | Freq: Every day | NASAL | 11 refills | Status: AC
  Filled 2023-04-08: qty 16, 30d supply, fill #0
  Filled 2023-05-03: qty 16, 30d supply, fill #1
  Filled 2023-06-12: qty 16, 60d supply, fill #2
  Filled 2023-08-10: qty 16, 60d supply, fill #3
  Filled 2023-10-10: qty 16, 60d supply, fill #4
  Filled 2023-12-08: qty 16, 30d supply, fill #5
  Filled 2024-02-02: qty 16, 30d supply, fill #0
  Filled 2024-02-02: qty 16, 30d supply, fill #6
  Filled 2024-02-02: qty 16, 30d supply, fill #0

## 2023-04-11 ENCOUNTER — Other Ambulatory Visit (HOSPITAL_COMMUNITY): Payer: Self-pay

## 2023-04-11 MED ORDER — OXYCODONE HCL 5 MG PO TABS
5.0000 mg | ORAL_TABLET | Freq: Four times a day (QID) | ORAL | 0 refills | Status: DC | PRN
Start: 2023-04-11 — End: 2023-05-14
  Filled 2023-04-11: qty 42, 6d supply, fill #0

## 2023-04-11 MED ORDER — ASPIRIN 81 MG PO CHEW
CHEWABLE_TABLET | ORAL | 0 refills | Status: AC
  Filled 2023-04-11: qty 63, 42d supply, fill #0

## 2023-04-11 MED ORDER — TRAMADOL HCL 50 MG PO TABS
50.0000 mg | ORAL_TABLET | Freq: Four times a day (QID) | ORAL | 0 refills | Status: AC | PRN
Start: 2023-04-11 — End: ?
  Filled 2023-04-11: qty 40, 5d supply, fill #0

## 2023-04-11 MED ORDER — GABAPENTIN 300 MG PO CAPS
ORAL_CAPSULE | ORAL | 0 refills | Status: AC
  Filled 2023-04-11: qty 84, 42d supply, fill #0

## 2023-04-11 MED ORDER — ONDANSETRON HCL 4 MG PO TABS
4.0000 mg | ORAL_TABLET | Freq: Four times a day (QID) | ORAL | 0 refills | Status: AC | PRN
  Filled 2023-04-11: qty 20, 5d supply, fill #0

## 2023-04-11 MED ORDER — METHOCARBAMOL 500 MG PO TABS
500.0000 mg | ORAL_TABLET | Freq: Four times a day (QID) | ORAL | 0 refills | Status: DC | PRN
  Filled 2023-04-11: qty 40, 10d supply, fill #0

## 2023-04-13 ENCOUNTER — Other Ambulatory Visit: Payer: Self-pay

## 2023-04-13 ENCOUNTER — Emergency Department (HOSPITAL_BASED_OUTPATIENT_CLINIC_OR_DEPARTMENT_OTHER)
Admission: EM | Admit: 2023-04-13 | Discharge: 2023-04-13 | Disposition: A | Discharge status: Home / Self Care | Attending: Emergency Medicine | Admitting: Emergency Medicine

## 2023-04-13 ENCOUNTER — Encounter (HOSPITAL_BASED_OUTPATIENT_CLINIC_OR_DEPARTMENT_OTHER): Payer: Self-pay | Admitting: Emergency Medicine

## 2023-04-13 ENCOUNTER — Emergency Department (HOSPITAL_BASED_OUTPATIENT_CLINIC_OR_DEPARTMENT_OTHER)

## 2023-04-13 ENCOUNTER — Emergency Department (HOSPITAL_BASED_OUTPATIENT_CLINIC_OR_DEPARTMENT_OTHER): Admitting: Radiology

## 2023-04-13 DIAGNOSIS — Z7982 Long term (current) use of aspirin: Secondary | ICD-10-CM | POA: Insufficient documentation

## 2023-04-13 DIAGNOSIS — R1030 Lower abdominal pain, unspecified: Secondary | ICD-10-CM | POA: Diagnosis present

## 2023-04-13 DIAGNOSIS — R1032 Left lower quadrant pain: Secondary | ICD-10-CM | POA: Diagnosis not present

## 2023-04-13 DIAGNOSIS — K7689 Other specified diseases of liver: Secondary | ICD-10-CM | POA: Diagnosis not present

## 2023-04-13 DIAGNOSIS — R059 Cough, unspecified: Secondary | ICD-10-CM | POA: Insufficient documentation

## 2023-04-13 DIAGNOSIS — N3000 Acute cystitis without hematuria: Secondary | ICD-10-CM | POA: Diagnosis not present

## 2023-04-13 DIAGNOSIS — K5792 Diverticulitis of intestine, part unspecified, without perforation or abscess without bleeding: Secondary | ICD-10-CM | POA: Insufficient documentation

## 2023-04-13 DIAGNOSIS — I700 Atherosclerosis of aorta: Secondary | ICD-10-CM | POA: Insufficient documentation

## 2023-04-13 DIAGNOSIS — K449 Diaphragmatic hernia without obstruction or gangrene: Secondary | ICD-10-CM | POA: Diagnosis not present

## 2023-04-13 DIAGNOSIS — E039 Hypothyroidism, unspecified: Secondary | ICD-10-CM | POA: Insufficient documentation

## 2023-04-13 DIAGNOSIS — K5732 Diverticulitis of large intestine without perforation or abscess without bleeding: Secondary | ICD-10-CM | POA: Insufficient documentation

## 2023-04-13 HISTORY — DX: Rheumatoid arthritis, unspecified: M06.9

## 2023-04-13 HISTORY — DX: Diverticulitis of intestine, part unspecified, without perforation or abscess without bleeding: K57.92

## 2023-04-13 HISTORY — DX: Gastro-esophageal reflux disease without esophagitis: K21.9

## 2023-04-13 LAB — COMPREHENSIVE METABOLIC PANEL WITH GFR
ALT: 22 U/L (ref 0–44)
AST: 20 U/L (ref 15–41)
Albumin: 4.4 g/dL (ref 3.5–5.0)
Alkaline Phosphatase: 86 U/L (ref 38–126)
Anion gap: 9 (ref 5–15)
BUN: 17 mg/dL (ref 8–23)
CO2: 25 mmol/L (ref 22–32)
Calcium: 9.4 mg/dL (ref 8.9–10.3)
Chloride: 104 mmol/L (ref 98–111)
Creatinine, Ser: 0.73 mg/dL (ref 0.44–1.00)
GFR, Estimated: >60 mL/min (ref >60)
Glucose, Bld: 87 mg/dL (ref 70–99)
Potassium: 3.7 mmol/L (ref 3.5–5.1)
Sodium: 138 mmol/L (ref 135–145)
Total Bilirubin: 1.7 mg/dL — ABNORMAL HIGH (ref 0.0–1.2)
Total Protein: 6.7 g/dL (ref 6.5–8.1)

## 2023-04-13 LAB — URINALYSIS, W/ REFLEX TO CULTURE (INFECTION SUSPECTED)
Glucose, UA: NEGATIVE mg/dL
Hgb urine dipstick: NEGATIVE
Leukocytes,Ua: NEGATIVE
Nitrite: POSITIVE — AB
Protein, ur: NEGATIVE mg/dL
Specific Gravity, Urine: 1.019 (ref 1.005–1.030)
pH: 5.5 (ref 5.0–8.0)

## 2023-04-13 LAB — CBC
HCT: 46.1 % — ABNORMAL HIGH (ref 36.0–46.0)
Hemoglobin: 16.1 g/dL — ABNORMAL HIGH (ref 12.0–15.0)
MCH: 32.5 pg (ref 26.0–34.0)
MCHC: 34.9 g/dL (ref 30.0–36.0)
MCV: 93.1 fL (ref 80.0–100.0)
Platelets: 147 10*3/uL — ABNORMAL LOW (ref 150–400)
RBC: 4.95 MIL/uL (ref 3.87–5.11)
RDW: 11.9 % (ref 11.5–15.5)
WBC: 5.9 10*3/uL (ref 4.0–10.5)
nRBC: 0 % (ref 0.0–0.2)

## 2023-04-13 LAB — RESP PANEL BY RT-PCR (RSV, FLU A&B, COVID)  RVPGX2
Influenza A by PCR: NEGATIVE
Influenza B by PCR: NEGATIVE
Resp Syncytial Virus by PCR: NEGATIVE
SARS Coronavirus 2 by RT PCR: NEGATIVE

## 2023-04-13 LAB — LACTIC ACID, PLASMA: Lactic Acid, Venous: 0.9 mmol/L (ref 0.5–1.9)

## 2023-04-13 LAB — LIPASE, BLOOD: Lipase: 10 U/L — ABNORMAL LOW (ref 11–51)

## 2023-04-13 MED ORDER — CEFPODOXIME PROXETIL 200 MG PO TABS: 200.0000 mg | ORAL_TABLET | Freq: Two times a day (BID) | ORAL | 0 refills | Status: AC

## 2023-04-13 MED ORDER — IOHEXOL 300 MG/ML  SOLN
100.0000 mL | Freq: Once | INTRAMUSCULAR | Status: AC | PRN
  Administered 2023-04-13: 100 mL via INTRAVENOUS

## 2023-04-13 MED ORDER — METRONIDAZOLE 500 MG PO TABS
500.0000 mg | ORAL_TABLET | Freq: Two times a day (BID) | ORAL | 0 refills | Status: AC
Start: 2023-04-13 — End: 2023-04-20

## 2023-04-13 NOTE — Discharge Instructions (Signed)
 Your history, exam and workup today confirm you do have both a urinary tract infection and diverticulitis as you suspected.  This is the likely cause of your bowel changes as well.  I spoke to pharmacy who recommended 2 different antibiotics to cover both infections with cefpodoxime and Flagyl to be taken for 1 week.  Please call your surgeon tomorrow to discuss if you are still able to have surgery on your knee in several days and please follow-up with your primary doctor as well.  Please rest and stay hydrated.  If any symptoms change or worsen acutely, please return to the nearest emergency department.

## 2023-04-13 NOTE — ED Notes (Signed)
 Dc instructions reviewed with patient. Patient voiced understanding. Dc with belongings.

## 2023-04-13 NOTE — ED Triage Notes (Signed)
 Yesterday started with pelvic pressure,concentrated urine.gynecology seen 2 weeks ago with no findings. Pt has knee replacement planned for Tuesday.lower back pain as well.

## 2023-04-13 NOTE — ED Triage Notes (Signed)
 Last night mucus and blood in stool.

## 2023-04-13 NOTE — ED Notes (Signed)
 MD at bedside during triage

## 2023-04-13 NOTE — ED Provider Notes (Signed)
 [place] EMERGENCY DEPARTMENT AT [facility] Provider Note   CSN: [id number] Arrival date & time: 04/13/23  4098     History  No chief complaint on file.   Olivia Rubio is a 68 y.o. female.  The history is provided by the patient and medical records. No language interpreter was used.  Abdominal Pain Pain location:  Suprapubic Pain quality: aching and cramping   Pain radiates to:  Suprapubic region and back Pain severity:  Moderate Onset quality:  Gradual Duration:  3 days Progression:  Waxing and waning Chronicity:  Recurrent Context: not trauma   Relieved by:  Nothing Worsened by:  Palpation and bowel movements Ineffective treatments:  None tried Associated symptoms: chills, cough, dysuria, fatigue, hematochezia and nausea   Associated symptoms: no chest pain, no constipation, no diarrhea, no fever, no shortness of breath, no vaginal bleeding, no vaginal discharge and no vomiting        Home Medications Prior to Admission medications   Medication Sig Start Date End Date Taking? Authorizing Provider  amoxicillin (AMOXIL) 500 MG capsule Take 1 capsule (500 mg total) by mouth 3 (three) times daily until gone 03/04/23     amoxicillin-clavulanate (AUGMENTIN) 875-125 MG tablet Take 1 tablet by mouth 2 (two) times daily. 01/12/21   Waldon Merl, PA-C  amoxicillin-clavulanate (AUGMENTIN) 875-125 MG tablet Take 1 tablet by mouth every 12 (twelve) hours for 10 days 11/14/21     amoxicillin-clavulanate (AUGMENTIN) 875-125 MG tablet Take 1 tablet by mouth 2 (two) times daily. 01/27/23     aspirin 81 MG chewable tablet Chew 1 tablet (81 mg total) by mouth 2 (two) times daily for 21 days following surgery, THEN 1 tablet (81 mg total) daily for 21 days, THEN stop. 04/11/23 05/23/23    aspirin 81 MG EC tablet Take 1 tablet (81 mg total) by mouth 2 (two) times daily. 04/18/20     azelastine (OPTIVAR) 0.05 % ophthalmic solution Place 1 drop into both eyes 2 (two) times daily.  01/30/21   Waldon Merl, PA-C  calcitonin, salmon, (MIACALCIN/FORTICAL) 200 UNIT/ACT nasal spray 1 spray daily. 08/17/19   [provider]  celecoxib (CELEBREX) 200 MG capsule Take 1 capsule (200 mg total) by mouth daily with food Patient not taking: Reported on 10/27/2020 06/23/20     celecoxib (CELEBREX) 200 MG capsule Take 1 capsule (200 mg total) by mouth daily with food. 06/19/21     celecoxib (CELEBREX) 200 MG capsule Take 1 capsule (200 mg total) by mouth daily with food 03/17/23     colestipol (COLESTID) 1 g tablet Take 2 tablets (2 g total) by mouth daily. 09/13/20     COVID-19 At Home Antigen Test [facility] COVID-19 HOME TEST) KIT use as directed 06/08/20   Driscilla Grammes, RPH  cyclobenzaprine (FLEXERIL) 10 MG tablet Take 10 mg by mouth 3 (three) times daily as needed.     [provider]  cyclobenzaprine (FLEXERIL) 5 MG tablet Take 1 tablet (5 mg total) by mouth 3 (three) times daily as needed. For muscle spasms 08/10/22     denosumab (PROLIA) 60 MG/ML SOSY injection as directed Subcutaneous once every six months 180 days 07/23/21     denosumab (PROLIA) 60 MG/ML SOSY injection as directed Subcutaneous once every six months 180 days 02/11/22     dicyclomine (BENTYL) 10 MG capsule Take 2 capsules (20 mg total) by mouth 3 (three) times daily. 01/22/21     dicyclomine (BENTYL) 20 MG tablet Take 1 tablet (  20 mg total) by mouth 3 (three) times daily as needed. 11/28/21     fluticasone (FLONASE) 50 MCG/ACT nasal spray Place 1 spray into both nostrils daily. 04/08/23     folic acid (FOLVITE) 1 MG tablet Take 1 mg by mouth 3 (three) times daily.     [provider]  gabapentin (NEURONTIN) 300 MG capsule Take 1 capsule (300 mg total) by mouth 3 (three) times daily for 14 days, THEN 1 capsule (300 mg total) 2 (two) times daily for 14 days, THEN 1 capsule (300 mg total) daily for 14 days, THEN stop. 04/11/23 05/23/23    hydrochlorothiazide (MICROZIDE) 12.5 MG capsule TAKE 1 CAPSULE  (12.5 MG TOTAL) BY MOUTH DAILY. 10/08/19 10/07/20  Debbe Odea, MD  HYDROcodone-acetaminophen (NORCO/VICODIN) 5-325 MG tablet Take 1 tablet by mouth every 6 to 8 hours as needed for pain for 5 days 12/15/20     HYDROcodone-acetaminophen (NORCO/VICODIN) 5-325 MG tablet Take 1 tablet by mouth 2 (two) times daily as needed for pain 03/06/21     HYDROcodone-acetaminophen (NORCO/VICODIN) 5-325 MG tablet Take 1 tablet by mouth 2 (two) times daily as needed. 03/12/22     ibuprofen (ADVIL) 800 MG tablet Take 800 mg by mouth every 6 (six) hours as needed.  06/15/19   [provider]  meloxicam (MOBIC) 15 MG tablet Take 1 tablet (15 mg total) by mouth daily. 12/27/22     methocarbamol (ROBAXIN) 500 MG tablet Take 1 tablet (500 mg total) by mouth every 6 (six) hours for 3 days and then as needed. 12/17/22     methocarbamol (ROBAXIN) 500 MG tablet Take 1 tablet (500 mg total) by mouth every 6 (six) hours as needed for muscle spasms. 04/11/23     metoprolol tartrate (LOPRESSOR) 100 MG tablet Take 1 tablet (100 mg total) by mouth once for 1 dose. Take 2 hours prior to your CT scan. 10/08/19 10/08/19  Debbe Odea, MD  naproxen (EC NAPROSYN) 500 MG EC tablet Take 1 tablet (500 mg total) by mouth every 12 (twelve) hours. 12/17/22     nirmatrelvir & ritonavir (PAXLOVID) 20 x 150 MG & 10 x 100MG  TBPK Take 3 tablets by mouth 2 (two) times daily. 03/20/21   Shon Hale, MD  nitrofurantoin, macrocrystal-monohydrate, (MACROBID) 100 MG capsule Take 1 capsule (100 mg total) by mouth every 12 (twelve) hours for 7 days 01/27/21     ondansetron (ZOFRAN) 4 MG tablet Take 1 tablet (4 mg total) by mouth every 6 (six) hours as needed for nausea. 04/11/23     ondansetron (ZOFRAN-ODT) 4 MG disintegrating tablet Dissolve 1 tablet (4 mg total) in mouth every 8 (eight) hours as needed for nausea or vomiting. 11/26/22   Menshew, Charlesetta Ivory, PA-C  oxyCODONE (OXY IR/ROXICODONE) 5 MG immediate release tablet Take 1 tablet  (5 mg total) by mouth every 4 (four) hours as needed for 3 days 04/18/20     oxyCODONE (OXY IR/ROXICODONE) 5 MG immediate release tablet Take 1-2 tablets (5-10 mg total) by mouth every 6 (six) hours as needed for severe pain. 04/11/23     pantoprazole (PROTONIX) 40 MG tablet Take 40 mg by mouth daily.      [provider]  pantoprazole (PROTONIX) 40 MG tablet TAKE 1 TABLET BY MOUTH TWICE DAILY 04/04/23     phenazopyridine (PHENAZO) 200 MG tablet Take 1 tablet (200 mg total) by mouth 3 (three) times daily as needed for urination burning after meals 01/27/21     phenazopyridine [facility])  200 MG tablet Take 1 tablet by mouth after meals 3 times a day as needed for burning on urination 05/16/21     phenazopyridine (PYRIDIUM) 200 MG tablet Take 1 tablet (200 mg total) by mouth 3 (three) times daily. 08/21/20     predniSONE (DELTASONE) 5 MG tablet Take 5 mg by mouth daily with breakfast.     [provider]  predniSONE (DELTASONE) 5 MG tablet Take 1 tablet (5 mg total) by mouth daily. 02/14/23     promethazine-dextromethorphan (PROMETHAZINE-DM) 6.25-15 MG/5ML syrup Take 5 mls by mouth every 8 hrs as needed for cough 01/27/21     sarilumab (KEVZARA) 200 MG/1. SOSY 1.14 ml Subcutaneous bi-weekly 30 days 10/18/21     spironolactone (ALDACTONE) 25 MG tablet TAKE 2 TABLETS (50 MG TOTAL) BY MOUTH DAILY. 10/08/19 10/07/20  Debbe Odea, MD  sucralfate (CARAFATE) 1 g tablet Take 1 tablet (1 g total) by mouth 2 (two) times daily on an empty stomach 12/13/20     sucralfate (CARAFATE) 1 g tablet Take 1 tablet (1 g total) by mouth 2 (two) times daily on an empty stomach 1hour before a meal 02/12/21     sucralfate (CARAFATE) 1 g tablet Take 1 tablet (1 g total) by mouth 2 (two) times daily 1 hour before a meal on an empty stomach 07/05/21     sulfamethoxazole-trimethoprim (BACTRIM DS) 800-160 MG tablet Take 1 tablet by mouth 2 (two) times daily for 5 days. 02/07/21     sulfamethoxazole-trimethoprim (BACTRIM  DS) 800-160 MG tablet Take 1 tablet by mouth 2 times a day for 5 days 05/16/21     tiZANidine (ZANAFLEX) 4 MG tablet Take 1 tablet (4 mg total) by mouth 3 (three) times daily as needed for muscle spasm for 10 days 05/20/21     traMADol (ULTRAM) 50 MG tablet Take 1-2 tablets (50-100 mg total) by mouth every 6 (six) hours as needed for moderate pain. 04/11/23     vancomycin (VANCOCIN) 125 MG capsule Take 1 capsule (125 mg total) by mouth 4 (four) times daily for 10 days. (take every 6 hours) 01/22/21     Vitamin D, Ergocalciferol, (DRISDOL) 1.25 MG (50000 UNIT) CAPS capsule Take 50,000 Units by mouth once a week. 09/21/19   [provider]      Allergies    Moxifloxacin and Methylprednisolone    Review of Systems   Review of Systems  Constitutional:  Positive for chills and fatigue. Negative for fever.  HENT:  Negative for congestion.   Respiratory:  Positive for cough. Negative for choking, chest tightness, shortness of breath and wheezing.   Cardiovascular:  Negative for chest pain, palpitations and leg swelling.  Gastrointestinal:  Positive for abdominal pain, blood in stool, hematochezia and nausea. Negative for abdominal distention, constipation, diarrhea and vomiting.  Genitourinary:  Positive for dysuria. Negative for flank pain, frequency, vaginal bleeding and vaginal discharge.  Musculoskeletal:  Positive for back pain. Negative for neck pain and neck stiffness.  Skin:  Negative for rash and wound.  Neurological:  Negative for headaches.  Psychiatric/Behavioral:  Negative for agitation and confusion.   All other systems reviewed and are negative.   Physical Exam Updated Vital Signs There were no vitals taken for this visit. Physical Exam Vitals and nursing note reviewed.  Constitutional:      General: She is not in acute distress.    Appearance: She is well-developed.  HENT:     Head: Normocephalic and atraumatic.  Eyes:  Conjunctiva/sclera: Conjunctivae normal.   Cardiovascular:     Rate and Rhythm: Normal rate and regular rhythm.     Heart sounds: No murmur heard. Pulmonary:     Effort: Pulmonary effort is normal. No respiratory distress.     Breath sounds: Normal breath sounds. No wheezing, rhonchi or rales.  Chest:     Chest wall: No tenderness.  Abdominal:     General: Bowel sounds are normal.     Palpations: Abdomen is soft.     Tenderness: There is abdominal tenderness in the suprapubic area. There is no right CVA tenderness, left CVA tenderness, guarding or rebound.  Musculoskeletal:        General: No swelling.     Cervical back: Neck supple.  Skin:    General: Skin is warm and dry.     Capillary Refill: Capillary refill takes less than 2 seconds.     Coloration: Skin is not pale.     Findings: No rash.  Neurological:     General: No focal deficit present.     Mental Status: She is alert.  Psychiatric:        Mood and Affect: Mood normal.     ED Results / Procedures / Treatments   Labs (all labs ordered are listed, but only abnormal results are displayed) Labs Reviewed  COMPREHENSIVE METABOLIC PANEL WITH GFR - Abnormal; Notable for the following components:      Result Value   Total Bilirubin 1.7 (*)    All other components within normal limits  CBC - Abnormal; Notable for the following components:   Hemoglobin 16.1 (*)    HCT 46.1 (*)    Platelets 147 (*)    All other components within normal limits  LIPASE, BLOOD - Abnormal; Notable for the following components:   Lipase 10 (*)    All other components within normal limits  URINALYSIS, W/ REFLEX TO CULTURE (INFECTION SUSPECTED) - Abnormal; Notable for the following components:   Bilirubin Urine SMALL (*)    Ketones, ur TRACE (*)    Nitrite POSITIVE (*)    Bacteria, UA RARE (*)    All other components within normal limits  RESP PANEL BY RT-PCR (RSV, FLU A&B, COVID)  [id number]  LACTIC ACID, PLASMA  LACTIC ACID, PLASMA  OCCULT BLOOD X 1 CARD TO LAB, STOOL  POC OCCULT  BLOOD, ED    EKG None  Radiology CT ABDOMEN PELVIS W CONTRAST Result Date: 04/13/2023 CLINICAL DATA:  Left lower quadrant abdominal pain EXAM: CT ABDOMEN AND PELVIS WITH CONTRAST TECHNIQUE: Multidetector CT imaging of the abdomen and pelvis was performed using the standard protocol following bolus administration of intravenous contrast. RADIATION DOSE REDUCTION: This exam was performed according to the departmental dose-optimization program which includes automated exposure control, adjustment of the mA and/or kV according to patient size and/or use of iterative reconstruction technique. CONTRAST:  OMNIPAQUE IOHEXOL 300 MG/ML  SOLN COMPARISON:  11/13/2021 FINDINGS: Lower chest: No acute abnormality. Hepatobiliary: Three hepatic cysts, largest measuring 2.2 cm in the inferior right hepatic lobe, not significantly changed. No new focal liver abnormality. Unremarkable gallbladder. No hyperdense gallstone. No biliary dilatation. Pancreas: Unremarkable. No pancreatic ductal dilatation or surrounding inflammatory changes. Spleen: Normal in size without focal abnormality. Adrenals/Urinary Tract: Unremarkable adrenal glands. Kidneys enhance symmetrically without focal lesion, stone, or hydronephrosis. Ureters are nondilated. Urinary bladder appears unremarkable for the degree of distention. Stomach/Bowel: Small hiatal hernia. Stomach otherwise unremarkable. No dilated loops of bowel. Normal appendix in the right lower  quadrant. Left-sided colonic diverticulosis. Mild wall thickening and subtle pericolonic fat stranding within the mid sigmoid colon. No pericolonic fluid collection. No extraluminal air. Vascular/Lymphatic: Scattered aortoiliac atherosclerotic calcifications without aneurysm. No abdominopelvic lymphadenopathy. Reproductive: Status post hysterectomy. No adnexal masses. Other: No free fluid. No abdominopelvic fluid collection. No pneumoperitoneum. No abdominal wall hernia. Musculoskeletal: Chronic  mild superior endplate compression fracture of L1. No new or acute osseous abnormality. IMPRESSION: 1. Mild acute uncomplicated sigmoid diverticulitis. 2. Small hiatal hernia. 3. Aortic atherosclerosis (ICD10-I70.0). Electronically Signed   By: Duanne Guess D.O.   On: 04/13/2023 09:24   DG Chest 2 View Result Date: 04/13/2023 CLINICAL DATA:  Cough EXAM: CHEST - 2 VIEW COMPARISON:  11/26/2022 FINDINGS: The heart size and mediastinal contours are within normal limits. Aortic atherosclerosis. Both lungs are clear. The visualized skeletal structures are unremarkable. IMPRESSION: No active cardiopulmonary disease. Electronically Signed   By: Duanne Guess D.O.   On: 04/13/2023 08:37    Procedures Procedures    Medications Ordered in ED Medications  iohexol (OMNIPAQUE) 300 MG/ML solution 100 mL (100 mLs Intravenous Contrast Given 04/13/23 4742)    ED Course/ Medical Decision Making/ A&P                                 Medical Decision Making Amount and/or Complexity of Data Reviewed Labs: ordered. Radiology: ordered.  Risk Prescription drug management.    Olivia Rubio is a 68 y.o. female with a past medical history significant for previous hysterectomy, endometriosis, osteoporosis, GERD, hypothyroidism, and previous diverticulitis who presents with chills, abdominal pain, nausea, and rectal bleeding.  According to patient, this is similar to how she had diverticulitis in the past.  She reports last few days she has had some discomfort in her abdomen and she did eat some [place] food on Thursday that caused some reflux symptoms.  She denies any chest pain or shortness at this time but has had some mild cough.  She reports chills but no actual fevers at home.  She reports she had darkened urine but denies dysuria.  She has taken Azo that did not seem to help with her abdominal pain.  She reports the pain is a 5 or 6 out of 10 across her abdomen and goes around towards her back.  She  reports it feels similar to her previous diverticulitis.  She reports she has had some blood and mucus in her stool since yesterday that is similar to prior.  She does not take blood thinners.  Denies any trauma.  Denies any leg pain or leg swelling different than her baseline.  Denies any palpitations or chest pain.  Denies any other complaints.  On exam, lungs clear.  Chest nontender.  Abdomen mildly tender in the suprapubic area.  CVA areas and back nontender.  No rash seen to suggest shingles.  Lungs were clear.  Chest nontender.  No murmur.  Good pulse in extremities.  Legs have some edema but she reports that is chronic.  Will get labs and urinalysis and a CT abdomen pelvis to look for acute diverticulitis.  The urine will rule out UTI.  Will get chest x-ray given her cough and check for COVID as well.  She reports he is post to have surgery on her knee next week and wants to make sure she is still able to have it.  She does not want pain medicine initially.  Anticipate reassessment after  workup to determine disposition.  11:38 AM CT returned showing evidence of mild acute uncomplicated diverticulitis and evidence of UTI.  I spoke to pharmacy who recommended cefpodoxime 200 mg twice daily for 1 week and Flagyl 500 mg twice daily for 1 week.  Will get these prescription printed for her and she will be discharged for outpatient follow-up.         Final Clinical Impression(s) / ED Diagnoses Final diagnoses:  Acute cystitis without hematuria  Diverticulitis  Lower abdominal pain    Rx / DC Orders ED Discharge Orders          Ordered    cefpodoxime (VANTIN) 200 MG tablet  2 times daily        04/13/23 1139    metroNIDAZOLE (FLAGYL) 500 MG tablet  2 times daily        04/13/23 1139           Clinical Impression: 1. Acute cystitis without hematuria   2. Diverticulitis   3. Lower abdominal pain     Disposition: Discharge  Condition: Good  I have discussed the results,  Dx and Tx plan with the pt(& family if present). He/she/they expressed understanding and agree(s) with the plan. Discharge instructions discussed at great length. Strict return precautions discussed and pt &/or family have verbalized understanding of the instructions. No further questions at time of discharge.    New Prescriptions   CEFPODOXIME (VANTIN) 200 MG TABLET    Take 1 tablet (200 mg total) by mouth 2 (two) times daily for 7 days.   METRONIDAZOLE (FLAGYL) 500 MG TABLET    Take 1 tablet (500 mg total) by mouth 2 (two) times daily for 7 days.    Follow Up: Shirlean Mylar, MD [address] [place] [address] [phone]     [facility] Emergency Department at [facility] [address] [place] [address] [phone]       Byron Tipping, Canary Brim, MD 04/13/23 1140

## 2023-04-17 NOTE — Progress Notes (Signed)
 Pt attended 02/21/23 screening event with BP of 159/83. Pt noted at event that she does have a PCP. At event pt indicated food SDOH needs. Pt also noted that she is not a smoker and listed Medicare as her insurance at the event.   Per initial f/u [facility] called pt's PCP (Dibas Koirala; [facility] [place]) office and front desk confirmed and verified that pt's last appt with PCP was 02/28/2023, which is not a [facility]-visible visit. [facility] called pt on 04/17/23 and pt declined being sent any resources for food SDOH.  Per chart review pt does have a PCP, insurance, and is not a smoker. Pt does not indicate any SDOH needs and has declined resources being sent at this time.  No additional pt f/u to be scheduled at this time per health equity protocol.

## 2023-04-18 ENCOUNTER — Other Ambulatory Visit (HOSPITAL_COMMUNITY): Payer: Self-pay

## 2023-04-18 DIAGNOSIS — K5792 Diverticulitis of intestine, part unspecified, without perforation or abscess without bleeding: Secondary | ICD-10-CM | POA: Diagnosis not present

## 2023-04-18 DIAGNOSIS — N390 Urinary tract infection, site not specified: Secondary | ICD-10-CM | POA: Diagnosis not present

## 2023-04-18 MED ORDER — METRONIDAZOLE 500 MG PO TABS
500.0000 mg | ORAL_TABLET | Freq: Two times a day (BID) | ORAL | 0 refills | Status: AC
  Filled 2023-04-18 (×2): qty 6, 3d supply, fill #0

## 2023-04-18 MED ORDER — CEFPODOXIME PROXETIL 200 MG PO TABS
200.0000 mg | ORAL_TABLET | Freq: Two times a day (BID) | ORAL | 0 refills | Status: AC
  Filled 2023-04-18 (×2): qty 6, 3d supply, fill #0

## 2023-04-24 DIAGNOSIS — Z01818 Encounter for other preprocedural examination: Secondary | ICD-10-CM | POA: Diagnosis not present

## 2023-05-04 ENCOUNTER — Other Ambulatory Visit: Payer: Self-pay

## 2023-05-07 DIAGNOSIS — M25761 Osteophyte, right knee: Secondary | ICD-10-CM | POA: Diagnosis not present

## 2023-05-07 DIAGNOSIS — G8918 Other acute postprocedural pain: Secondary | ICD-10-CM | POA: Diagnosis not present

## 2023-05-07 DIAGNOSIS — M1711 Unilateral primary osteoarthritis, right knee: Secondary | ICD-10-CM | POA: Diagnosis not present

## 2023-05-09 DIAGNOSIS — M25561 Pain in right knee: Secondary | ICD-10-CM | POA: Diagnosis not present

## 2023-05-09 DIAGNOSIS — R2689 Other abnormalities of gait and mobility: Secondary | ICD-10-CM | POA: Diagnosis not present

## 2023-05-10 DIAGNOSIS — R0789 Other chest pain: Secondary | ICD-10-CM | POA: Diagnosis not present

## 2023-05-12 DIAGNOSIS — M25561 Pain in right knee: Secondary | ICD-10-CM | POA: Diagnosis not present

## 2023-05-12 DIAGNOSIS — R2689 Other abnormalities of gait and mobility: Secondary | ICD-10-CM | POA: Diagnosis not present

## 2023-05-14 ENCOUNTER — Other Ambulatory Visit: Payer: Self-pay

## 2023-05-14 DIAGNOSIS — R2689 Other abnormalities of gait and mobility: Secondary | ICD-10-CM | POA: Diagnosis not present

## 2023-05-14 DIAGNOSIS — M25561 Pain in right knee: Secondary | ICD-10-CM | POA: Diagnosis not present

## 2023-05-14 MED ORDER — OXYCODONE HCL 5 MG PO TABS
5.0000 mg | ORAL_TABLET | Freq: Four times a day (QID) | ORAL | 0 refills | Status: DC | PRN
  Filled 2023-05-14: qty 30, 4d supply, fill #0

## 2023-05-15 ENCOUNTER — Other Ambulatory Visit: Payer: Self-pay

## 2023-05-15 ENCOUNTER — Emergency Department (HOSPITAL_BASED_OUTPATIENT_CLINIC_OR_DEPARTMENT_OTHER)
Admission: EM | Admit: 2023-05-15 | Discharge: 2023-05-15 | Disposition: A | Discharge status: Home / Self Care | Attending: Emergency Medicine | Admitting: Emergency Medicine

## 2023-05-15 ENCOUNTER — Encounter (HOSPITAL_BASED_OUTPATIENT_CLINIC_OR_DEPARTMENT_OTHER): Payer: Self-pay | Admitting: Emergency Medicine

## 2023-05-15 ENCOUNTER — Emergency Department (HOSPITAL_BASED_OUTPATIENT_CLINIC_OR_DEPARTMENT_OTHER): Admitting: Radiology

## 2023-05-15 ENCOUNTER — Emergency Department (HOSPITAL_BASED_OUTPATIENT_CLINIC_OR_DEPARTMENT_OTHER)

## 2023-05-15 DIAGNOSIS — R0789 Other chest pain: Secondary | ICD-10-CM

## 2023-05-15 DIAGNOSIS — Z96651 Presence of right artificial knee joint: Secondary | ICD-10-CM | POA: Diagnosis not present

## 2023-05-15 DIAGNOSIS — R079 Chest pain, unspecified: Secondary | ICD-10-CM | POA: Diagnosis not present

## 2023-05-15 DIAGNOSIS — Z7982 Long term (current) use of aspirin: Secondary | ICD-10-CM | POA: Insufficient documentation

## 2023-05-15 DIAGNOSIS — E041 Nontoxic single thyroid nodule: Secondary | ICD-10-CM | POA: Diagnosis not present

## 2023-05-15 DIAGNOSIS — K449 Diaphragmatic hernia without obstruction or gangrene: Secondary | ICD-10-CM | POA: Diagnosis not present

## 2023-05-15 LAB — BASIC METABOLIC PANEL WITH GFR
Anion gap: 9 (ref 5–15)
BUN: 14 mg/dL (ref 8–23)
CO2: 27 mmol/L (ref 22–32)
Calcium: 9.3 mg/dL (ref 8.9–10.3)
Chloride: 101 mmol/L (ref 98–111)
Creatinine, Ser: 0.8 mg/dL (ref 0.44–1.00)
GFR, Estimated: >60 mL/min (ref >60)
Glucose, Bld: 107 mg/dL — ABNORMAL HIGH (ref 70–99)
Potassium: 3.6 mmol/L (ref 3.5–5.1)
Sodium: 137 mmol/L (ref 135–145)

## 2023-05-15 LAB — CBC
HCT: 36.5 % (ref 36.0–46.0)
Hemoglobin: 12.3 g/dL (ref 12.0–15.0)
MCH: 32.5 pg (ref 26.0–34.0)
MCHC: 33.7 g/dL (ref 30.0–36.0)
MCV: 96.3 fL (ref 80.0–100.0)
Platelets: 148 10*3/uL — ABNORMAL LOW (ref 150–400)
RBC: 3.79 MIL/uL — ABNORMAL LOW (ref 3.87–5.11)
RDW: 12.6 % (ref 11.5–15.5)
WBC: 6 10*3/uL (ref 4.0–10.5)
nRBC: 0 % (ref 0.0–0.2)

## 2023-05-15 LAB — D-DIMER, QUANTITATIVE: D-Dimer, Quant: 5.88 ug{FEU}/mL — ABNORMAL HIGH (ref 0.00–0.50)

## 2023-05-15 LAB — TROPONIN T, HIGH SENSITIVITY
Troponin T High Sensitivity: <15 ng/L (ref <19)
Troponin T High Sensitivity: <15 ng/L (ref <19)

## 2023-05-15 MED ORDER — LIDOCAINE 5 % EX PTCH
1.0000 | MEDICATED_PATCH | CUTANEOUS | 0 refills | Status: AC
  Filled 2023-05-15 (×2): qty 30, 30d supply, fill #0
  Filled 2023-05-16: qty 10, 10d supply, fill #0
  Filled 2023-05-16: qty 30, 30d supply, fill #0

## 2023-05-15 MED ORDER — IOHEXOL 350 MG/ML SOLN
100.0000 mL | Freq: Once | INTRAVENOUS | Status: AC | PRN
  Administered 2023-05-15: 100 mL via INTRAVENOUS

## 2023-05-15 NOTE — ED Triage Notes (Signed)
 Pt caox4, ambulatory c/o CP since Thursday evening. Pt reports recent knee replacement surgery stating she had to begin using a walker after that surgery and that she feels the pain in both sides of the chest when pushing herself up. Pt also c/o pain in L side of neck that started this morning.

## 2023-05-15 NOTE — Discharge Instructions (Addendum)
 Your workup today was overall reassuring.  Your heart enzymes were normal; does not appear like you are having a heart attack.  CT scan of your chest did not show evidence of blood clot in your lung, pneumonia or other acute abnormalities attributable to your chest pain.  As we discussed, I suspect your pain is more likely chest wall pain.  CT imaging did show a left-sided thyroid  nodule; I would recommend following up with your primary care for ultrasound of your thyroid  for further assessment regarding this area.  Please do not hesitate to return if the worrisome signs and symptoms we discussed become apparent.

## 2023-05-15 NOTE — ED Provider Notes (Signed)
 [facility] AT [facility] Provider Note   CSN: [id number] Arrival date & time: 05/15/23  1023     History  Chief Complaint  Patient presents with   Chest Pain    Olivia Rubio is a 68 y.o. female.   Chest Pain   68 year old female presents to the emergency department with chest pain.  Has been having chest pain since Thursday evening.  Initially noted when she was sitting in her home.  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  States that she has been using her walker more at home to get around with.  Chest pain worsened with movement of lower extremities.  Does feel short of breath when pain is severe.  Was having some radiation to her left neck earlier today.  Denies any fevers, chills, cough, history of DVT/PE, known coagulopathy, no malignancy, hormonal therapy.  Past medical history significant for GERD, diverticulitis, rheumatoid arthritis, osteoporosis  Home Medications Prior to Admission medications   Medication Sig Start Date End Date Taking? Authorizing Provider  amoxicillin  (AMOXIL ) 500 MG capsule Take 1 capsule (500 mg total) by mouth 3 (three) times daily until gone 03/04/23     amoxicillin -clavulanate (AUGMENTIN ) 875-125 MG tablet Take 1 tablet by mouth 2 (two) times daily. 01/12/21   Farris Hong, PA-C  amoxicillin -clavulanate (AUGMENTIN ) 875-125 MG tablet Take 1 tablet by mouth every 12 (twelve) hours for 10 days 11/14/21     amoxicillin -clavulanate (AUGMENTIN ) 875-125 MG tablet Take 1 tablet by mouth 2 (two) times daily. 01/27/23     aspirin  81 MG chewable tablet Chew 1 tablet (81 mg total) by mouth 2 (two) times daily for 21 days following surgery, THEN 1 tablet (81 mg total) daily for 21 days, THEN stop. 04/11/23 05/23/23    aspirin  81 MG EC tablet Take 1 tablet (81 mg total) by mouth 2 (two) times daily. 04/18/20     azelastine  (OPTIVAR ) 0.05 % ophthalmic solution Place 1 drop into both eyes 2 (two) times daily.  01/30/21   Farris Hong, PA-C  calcitonin, salmon, (MIACALCIN/FORTICAL) 200 UNIT/ACT nasal spray 1 spray daily. 08/17/19   [provider]  celecoxib  (CELEBREX ) 200 MG capsule Take 1 capsule (200 mg total) by mouth daily with food Patient not taking: Reported on 10/27/2020 06/23/20     celecoxib  (CELEBREX ) 200 MG capsule Take 1 capsule (200 mg total) by mouth daily with food. 06/19/21     celecoxib  (CELEBREX ) 200 MG capsule Take 1 capsule (200 mg total) by mouth daily with food 03/17/23     colestipol  (COLESTID ) 1 g tablet Take 2 tablets (2 g total) by mouth daily. 09/13/20     COVID-19 At Home Antigen Test [facility] COVID-19 HOME TEST) KIT use as directed 06/08/20   Elora Hales, [facility]  cyclobenzaprine  (FLEXERIL ) 10 MG tablet Take 10 mg by mouth 3 (three) times daily as needed.     [provider]  cyclobenzaprine  (FLEXERIL ) 5 MG tablet Take 1 tablet (5 mg total) by mouth 3 (three) times daily as needed. For muscle spasms 08/10/22     denosumab  (PROLIA ) 60 MG/ML SOSY injection as directed Subcutaneous once every six months 180 days 07/23/21     denosumab  (PROLIA ) 60 MG/ML SOSY injection as directed Subcutaneous once every six months 180 days 02/11/22     dicyclomine  (BENTYL ) 10 MG capsule Take 2 capsules (20 mg total) by mouth 3 (three) times daily. 01/22/21     dicyclomine  (BENTYL ) 20 MG tablet  Take 1 tablet (20 mg total) by mouth 3 (three) times daily as needed. 11/28/21     fluticasone  (FLONASE ) 50 MCG/ACT nasal spray Place 1 spray into both nostrils daily. 04/08/23     folic acid  (FOLVITE ) 1 MG tablet Take 1 mg by mouth 3 (three) times daily.     [provider]  gabapentin  (NEURONTIN ) 300 MG capsule Take 1 capsule (300 mg total) by mouth 3 (three) times daily for 14 days, THEN 1 capsule (300 mg total) 2 (two) times daily for 14 days, THEN 1 capsule (300 mg total) daily for 14 days, THEN stop. 04/11/23 05/23/23    hydrochlorothiazide  (MICROZIDE ) 12.5 MG capsule TAKE 1 CAPSULE  (12.5 MG TOTAL) BY MOUTH DAILY. 10/08/19 10/07/20  Constancia Delton, MD  HYDROcodone -acetaminophen  (NORCO/VICODIN) 5-325 MG tablet Take 1 tablet by mouth every 6 to 8 hours as needed for pain for 5 days 12/15/20     HYDROcodone -acetaminophen  (NORCO/VICODIN) 5-325 MG tablet Take 1 tablet by mouth 2 (two) times daily as needed for pain 03/06/21     HYDROcodone -acetaminophen  (NORCO/VICODIN) 5-325 MG tablet Take 1 tablet by mouth 2 (two) times daily as needed. 03/12/22     ibuprofen (ADVIL) 800 MG tablet Take 800 mg by mouth every 6 (six) hours as needed.  06/15/19   [provider]  meloxicam  (MOBIC ) 15 MG tablet Take 1 tablet (15 mg total) by mouth daily. 12/27/22     methocarbamol  (ROBAXIN ) 500 MG tablet Take 1 tablet (500 mg total) by mouth every 6 (six) hours for 3 days and then as needed. 12/17/22     methocarbamol  (ROBAXIN ) 500 MG tablet Take 1 tablet (500 mg total) by mouth every 6 (six) hours as needed for muscle spasms. 04/11/23     metoprolol  tartrate (LOPRESSOR ) 100 MG tablet Take 1 tablet (100 mg total) by mouth once for 1 dose. Take 2 hours prior to your CT scan. 10/08/19 10/08/19  Constancia Delton, MD  naproxen  (EC NAPROSYN ) 500 MG EC tablet Take 1 tablet (500 mg total) by mouth every 12 (twelve) hours. 12/17/22     nirmatrelvir  & ritonavir  (PAXLOVID ) 20 x 150 MG & 10 x 100MG  TBPK Take 3 tablets by mouth 2 (two) times daily. 03/20/21   Ransom Byers, MD  nitrofurantoin , macrocrystal-monohydrate, (MACROBID ) 100 MG capsule Take 1 capsule (100 mg total) by mouth every 12 (twelve) hours for 7 days 01/27/21     ondansetron  (ZOFRAN ) 4 MG tablet Take 1 tablet (4 mg total) by mouth every 6 (six) hours as needed for nausea. 04/11/23     ondansetron  (ZOFRAN -ODT) 4 MG disintegrating tablet Dissolve 1 tablet (4 mg total) in mouth every 8 (eight) hours as needed for nausea or vomiting. 11/26/22   Menshew, Raye Cai, PA-C  oxyCODONE  (OXY IR/ROXICODONE ) 5 MG immediate release tablet Take 1 tablet  (5 mg total) by mouth every 4 (four) hours as needed for 3 days 04/18/20     oxyCODONE  (OXY IR/ROXICODONE ) 5 MG immediate release tablet Take 1-2 tablets (5-10 mg total) by mouth every 6 (six) hours as needed for severe pain. 05/14/23     pantoprazole  (PROTONIX ) 40 MG tablet Take 40 mg by mouth daily.      [provider]  pantoprazole  (PROTONIX ) 40 MG tablet TAKE 1 TABLET BY MOUTH TWICE DAILY 04/04/23     phenazopyridine  (PHENAZO) 200 MG tablet Take 1 tablet (200 mg total) by mouth 3 (three) times daily as needed for urination burning after meals 01/27/21  phenazopyridine  (PHENAZO) 200 MG tablet Take 1 tablet by mouth after meals 3 times a day as needed for burning on urination 05/16/21     phenazopyridine  (PYRIDIUM ) 200 MG tablet Take 1 tablet (200 mg total) by mouth 3 (three) times daily. 08/21/20     predniSONE  (DELTASONE ) 5 MG tablet Take 5 mg by mouth daily with breakfast.     [provider]  predniSONE  (DELTASONE ) 5 MG tablet Take 1 tablet (5 mg total) by mouth daily. 02/14/23     promethazine -dextromethorphan (PROMETHAZINE -DM) 6.25-15 MG/5ML syrup Take 5 mls by mouth every 8 hrs as needed for cough 01/27/21     sarilumab  (KEVZARA ) 200 MG/1. SOSY 1.14 ml Subcutaneous bi-weekly 30 days 10/18/21     spironolactone  (ALDACTONE ) 25 MG tablet TAKE 2 TABLETS (50 MG TOTAL) BY MOUTH DAILY. 10/08/19 10/07/20  Constancia Delton, MD  sucralfate  (CARAFATE ) 1 g tablet Take 1 tablet (1 g total) by mouth 2 (two) times daily on an empty stomach 12/13/20     sucralfate  (CARAFATE ) 1 g tablet Take 1 tablet (1 g total) by mouth 2 (two) times daily on an empty stomach 1hour before a meal 02/12/21     sucralfate  (CARAFATE ) 1 g tablet Take 1 tablet (1 g total) by mouth 2 (two) times daily 1 hour before a meal on an empty stomach 07/05/21     sulfamethoxazole -trimethoprim  (BACTRIM  DS) 800-160 MG tablet Take 1 tablet by mouth 2 (two) times daily for 5 days. 02/07/21     sulfamethoxazole -trimethoprim  (BACTRIM   DS) 800-160 MG tablet Take 1 tablet by mouth 2 times a day for 5 days 05/16/21     tiZANidine  (ZANAFLEX ) 4 MG tablet Take 1 tablet (4 mg total) by mouth 3 (three) times daily as needed for muscle spasm for 10 days 05/20/21     traMADol  (ULTRAM ) 50 MG tablet Take 1-2 tablets (50-100 mg total) by mouth every 6 (six) hours as needed for moderate pain. 04/11/23     vancomycin  (VANCOCIN ) 125 MG capsule Take 1 capsule (125 mg total) by mouth 4 (four) times daily for 10 days. (take every 6 hours) 01/22/21     Vitamin D, Ergocalciferol, (DRISDOL) 1.25 MG (50000 UNIT) CAPS capsule Take 50,000 Units by mouth once a week. 09/21/19   [provider]      Allergies    Moxifloxacin and Methylprednisolone     Review of Systems   Review of Systems  Cardiovascular:  Positive for chest pain.  All other systems reviewed and are negative.   Physical Exam Updated Vital Signs BP (!) 146/64   Pulse 86   Temp 97.7 F (36.5 C) (Oral)   Resp 18   SpO2 98%  Physical Exam Vitals and nursing note reviewed.  Constitutional:      General: She is not in acute distress.    Appearance: She is well-developed.  HENT:     Head: Normocephalic and atraumatic.  Eyes:     Conjunctiva/sclera: Conjunctivae normal.  Cardiovascular:     Rate and Rhythm: Normal rate and regular rhythm.     Heart sounds: No murmur heard. Pulmonary:     Effort: Pulmonary effort is normal. No respiratory distress.     Breath sounds: Normal breath sounds. No wheezing, rhonchi or rales.  Chest:     Chest wall: Tenderness present.  Abdominal:     Palpations: Abdomen is soft.     Tenderness: There is no abdominal tenderness.  Musculoskeletal:        General: No swelling.  Cervical back: Neck supple.     Comments: Ecchymosis appreciated right lower extremity medial thigh, anteromedial knee.  Skin:    General: Skin is warm and dry.     Capillary Refill: Capillary refill takes less than 2 seconds.  Neurological:     Mental Status: She  is alert.  Psychiatric:        Mood and Affect: Mood normal.    ED Results / Procedures / Treatments   Labs (all labs ordered are listed, but only abnormal results are displayed) Labs Reviewed  BASIC METABOLIC PANEL WITH GFR  CBC  D-DIMER, QUANTITATIVE  TROPONIN T, HIGH SENSITIVITY    EKG EKG Interpretation Date/Time:  Thursday May 15 2023 10:30:30 EDT Ventricular Rate:  85 PR Interval:  157 QRS Duration:  90 QT Interval:  363 QTC Calculation: 432 R Axis:   52  Text Interpretation: Sinus rhythm Probable left atrial enlargement Confirmed by Lowery Rue [phone]) on 05/15/2023 10:34:47 AM  Radiology No results found.  Procedures Procedures    Medications Ordered in ED Medications - No data to display  ED Course/ Medical Decision Making/ A&P Clinical Course as of 05/15/23 1257  Thu May 15, 2023  1204 D-Dimer, Quant(!): 5.88 D-dimer elevated 5.88.  Will obtain CT PE study. [CR]    Clinical Course User Index [CR] [place] Butter, PA                                 Medical Decision Making Amount and/or Complexity of Data Reviewed Labs: ordered. Decision-making details documented in ED Course. Radiology: ordered.  Risk Prescription drug management.   This patient presents to the ED for concern of chest pain, this involves an extensive number of treatment options, and is a complaint that carries with it a high risk of complications and morbidity.  The differential diagnosis includes ACS, PE, pneumothorax, MSK, GERD, aortic dissection, pericarditis/myocarditis/tamponade, other   Co morbidities that complicate the patient evaluation  See HPI   Additional history obtained:  Additional history obtained from EMR External records from outside source obtained and reviewed including hospital records   Lab Tests:  I Ordered, and personally interpreted labs.  The pertinent results include: No leukocytosis.  No evidence of anemia.  Thrombocytopenia 148 of which  the patient's baseline from prior labs performed.  No Electra abnormalities.  No renal dysfunction.  Troponin of less than 15 with repeat less than 15.  D-dimer elevated 5.8   Imaging Studies ordered:  I ordered imaging studies including chest x-ray, CT angio chest PE I independently visualized and interpreted imaging which showed  Chest x-ray: No acute cardiopulmonary abnormality CT angio chest PE: No PE or other acute abnormality.  Thyroid  nodule on the left. I agree with the radiologist interpretation   Cardiac Monitoring: / EKG:  The patient was maintained on a cardiac monitor.  I personally viewed and interpreted the cardiac monitored which showed an underlying rhythm of: Sinus rhythm.  Right atrial lodgment.   Consultations Obtained:  N/a   Problem List / ED Course / Critical interventions / Medication management  Chest pain Reevaluation of the patient showed that the patient stayed the same I have reviewed the patients home medicines and have made adjustments as needed   Social Determinants of Health:  Denies tobacco, illicit drug use.   Test / Admission - Considered:  Chest pain Vitals signs significant for hypertension blood pressure 146/64. Otherwise within normal range  and stable throughout visit. Laboratory/imaging studies significant for: See above 68 year old female presents to the emergency department with chest pain.  Has been having chest pain since Thursday evening.  Initially noted when she was sitting in her home.  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  States that she has been using her walker more at home to get around with.  Chest pain worsened with movement of lower extremities.  Does feel short of breath when pain is severe.  Was having some radiation to her left neck earlier today.  Denies any fevers, chills, cough, history of DVT/PE, known coagulopathy, no malignancy, hormonal therapy. On exam, lungs clear to auscultation.  No  chest wall tenderness.  Patient's workup today overall reassuring.  Elevated troponin, lack of acute ischemic change on EKG; low suspicion for ACS.  Patient with elevated D-dimer but PE study that was negative for PE, pneumonia, pneumothorax or other acute cardiopulmonary abnormality.  Patient without chest pain rating to back, pulse deficits, neurodeficits; low suspicion for aortic dissection especially given CT PE study which was negative.  Suspect muscular skeletal etiology of patient's symptoms.  Patient already on multiple medications given most recent knee replacement including NSAIDs, opiate medication, muscle relaxer.  Recommend continue medical regimen and will add topical lidocaine  patches.  Treatment plan discussed with patient and she acknowledged understanding and was agreeable to said plan.  Patient overall well-appearing, afebrile in no acute distress. Worrisome signs and symptoms were discussed with the patient, and the patient acknowledged understanding to return to the ED if noticed. Patient was stable upon discharge.          Final Clinical Impression(s) / ED Diagnoses Final diagnoses:  None    Rx / DC Orders ED Discharge Orders     None         Leslie Butter, [place] 05/15/23 1545    Lowery Rue, DO 05/16/23 [phone]

## 2023-05-16 ENCOUNTER — Other Ambulatory Visit: Payer: Self-pay

## 2023-05-19 DIAGNOSIS — M25561 Pain in right knee: Secondary | ICD-10-CM | POA: Diagnosis not present

## 2023-05-19 DIAGNOSIS — R2689 Other abnormalities of gait and mobility: Secondary | ICD-10-CM | POA: Diagnosis not present

## 2023-05-20 ENCOUNTER — Other Ambulatory Visit (HOSPITAL_COMMUNITY): Payer: Self-pay

## 2023-05-20 MED ORDER — OXYCODONE HCL 5 MG PO TABS
5.0000 mg | ORAL_TABLET | Freq: Three times a day (TID) | ORAL | 0 refills | Status: DC | PRN
Start: 2023-05-19 — End: 2023-05-28
  Filled 2023-05-20: qty 40, 7d supply, fill #0

## 2023-05-20 MED ORDER — METHOCARBAMOL 500 MG PO TABS
500.0000 mg | ORAL_TABLET | Freq: Three times a day (TID) | ORAL | 0 refills | Status: AC | PRN
  Filled 2023-05-20: qty 30, 10d supply, fill #0

## 2023-05-21 DIAGNOSIS — M25561 Pain in right knee: Secondary | ICD-10-CM | POA: Diagnosis not present

## 2023-05-21 DIAGNOSIS — R2689 Other abnormalities of gait and mobility: Secondary | ICD-10-CM | POA: Diagnosis not present

## 2023-05-22 DIAGNOSIS — M25561 Pain in right knee: Secondary | ICD-10-CM | POA: Diagnosis not present

## 2023-05-22 DIAGNOSIS — R2689 Other abnormalities of gait and mobility: Secondary | ICD-10-CM | POA: Diagnosis not present

## 2023-05-26 DIAGNOSIS — R2689 Other abnormalities of gait and mobility: Secondary | ICD-10-CM | POA: Diagnosis not present

## 2023-05-26 DIAGNOSIS — M25561 Pain in right knee: Secondary | ICD-10-CM | POA: Diagnosis not present

## 2023-05-28 ENCOUNTER — Other Ambulatory Visit (HOSPITAL_COMMUNITY): Payer: Self-pay

## 2023-05-28 DIAGNOSIS — M25512 Pain in left shoulder: Secondary | ICD-10-CM | POA: Diagnosis not present

## 2023-05-28 DIAGNOSIS — K219 Gastro-esophageal reflux disease without esophagitis: Secondary | ICD-10-CM | POA: Diagnosis not present

## 2023-05-28 DIAGNOSIS — M542 Cervicalgia: Secondary | ICD-10-CM | POA: Diagnosis not present

## 2023-05-28 DIAGNOSIS — G6289 Other specified polyneuropathies: Secondary | ICD-10-CM | POA: Diagnosis not present

## 2023-05-28 DIAGNOSIS — M8080XA Other osteoporosis with current pathological fracture, unspecified site, initial encounter for fracture: Secondary | ICD-10-CM | POA: Diagnosis not present

## 2023-05-28 DIAGNOSIS — M1991 Primary osteoarthritis, unspecified site: Secondary | ICD-10-CM | POA: Diagnosis not present

## 2023-05-28 DIAGNOSIS — M545 Low back pain, unspecified: Secondary | ICD-10-CM | POA: Diagnosis not present

## 2023-05-28 DIAGNOSIS — Z860101 Personal history of adenomatous and serrated colon polyps: Secondary | ICD-10-CM | POA: Diagnosis not present

## 2023-05-28 DIAGNOSIS — Z79899 Other long term (current) drug therapy: Secondary | ICD-10-CM | POA: Diagnosis not present

## 2023-05-28 DIAGNOSIS — K5900 Constipation, unspecified: Secondary | ICD-10-CM | POA: Diagnosis not present

## 2023-05-28 DIAGNOSIS — M0589 Other rheumatoid arthritis with rheumatoid factor of multiple sites: Secondary | ICD-10-CM | POA: Diagnosis not present

## 2023-05-28 DIAGNOSIS — E669 Obesity, unspecified: Secondary | ICD-10-CM | POA: Diagnosis not present

## 2023-05-28 DIAGNOSIS — Z6832 Body mass index (BMI) 32.0-32.9, adult: Secondary | ICD-10-CM | POA: Diagnosis not present

## 2023-05-28 MED ORDER — OXYCODONE HCL 5 MG PO TABS
5.0000 mg | ORAL_TABLET | Freq: Two times a day (BID) | ORAL | 0 refills | Status: AC | PRN
  Filled 2023-05-28 – 2023-05-31 (×3): qty 30, 15d supply, fill #0

## 2023-05-31 ENCOUNTER — Other Ambulatory Visit (HOSPITAL_BASED_OUTPATIENT_CLINIC_OR_DEPARTMENT_OTHER): Payer: Self-pay

## 2023-05-31 ENCOUNTER — Encounter (HOSPITAL_BASED_OUTPATIENT_CLINIC_OR_DEPARTMENT_OTHER): Payer: Self-pay

## 2023-05-31 ENCOUNTER — Emergency Department (HOSPITAL_BASED_OUTPATIENT_CLINIC_OR_DEPARTMENT_OTHER)

## 2023-05-31 ENCOUNTER — Other Ambulatory Visit (HOSPITAL_COMMUNITY): Payer: Self-pay

## 2023-05-31 ENCOUNTER — Other Ambulatory Visit: Payer: Self-pay

## 2023-05-31 ENCOUNTER — Emergency Department (HOSPITAL_BASED_OUTPATIENT_CLINIC_OR_DEPARTMENT_OTHER)
Admission: EM | Admit: 2023-05-31 | Discharge: 2023-05-31 | Disposition: A | Discharge status: Home / Self Care | Attending: Emergency Medicine | Admitting: Emergency Medicine

## 2023-05-31 DIAGNOSIS — D72819 Decreased white blood cell count, unspecified: Secondary | ICD-10-CM | POA: Insufficient documentation

## 2023-05-31 DIAGNOSIS — K7689 Other specified diseases of liver: Secondary | ICD-10-CM | POA: Diagnosis not present

## 2023-05-31 DIAGNOSIS — K5732 Diverticulitis of large intestine without perforation or abscess without bleeding: Secondary | ICD-10-CM | POA: Diagnosis not present

## 2023-05-31 DIAGNOSIS — R1030 Lower abdominal pain, unspecified: Secondary | ICD-10-CM | POA: Diagnosis present

## 2023-05-31 DIAGNOSIS — R9431 Abnormal electrocardiogram [ECG] [EKG]: Secondary | ICD-10-CM | POA: Diagnosis not present

## 2023-05-31 DIAGNOSIS — E039 Hypothyroidism, unspecified: Secondary | ICD-10-CM | POA: Diagnosis not present

## 2023-05-31 DIAGNOSIS — N281 Cyst of kidney, acquired: Secondary | ICD-10-CM | POA: Diagnosis not present

## 2023-05-31 DIAGNOSIS — K449 Diaphragmatic hernia without obstruction or gangrene: Secondary | ICD-10-CM | POA: Diagnosis not present

## 2023-05-31 DIAGNOSIS — K5792 Diverticulitis of intestine, part unspecified, without perforation or abscess without bleeding: Secondary | ICD-10-CM

## 2023-05-31 LAB — COMPREHENSIVE METABOLIC PANEL WITH GFR
ALT: 47 U/L — ABNORMAL HIGH (ref 0–44)
AST: 47 U/L — ABNORMAL HIGH (ref 15–41)
Albumin: 4.4 g/dL (ref 3.5–5.0)
Alkaline Phosphatase: 137 U/L — ABNORMAL HIGH (ref 38–126)
Anion gap: 13 (ref 5–15)
BUN: 9 mg/dL (ref 8–23)
CO2: 24 mmol/L (ref 22–32)
Calcium: 9.8 mg/dL (ref 8.9–10.3)
Chloride: 101 mmol/L (ref 98–111)
Creatinine, Ser: 0.8 mg/dL (ref 0.44–1.00)
GFR, Estimated: >60 mL/min (ref >60)
Glucose, Bld: 102 mg/dL — ABNORMAL HIGH (ref 70–99)
Potassium: 3.3 mmol/L — ABNORMAL LOW (ref 3.5–5.1)
Sodium: 139 mmol/L (ref 135–145)
Total Bilirubin: 1.4 mg/dL — ABNORMAL HIGH (ref 0.0–1.2)
Total Protein: 6.9 g/dL (ref 6.5–8.1)

## 2023-05-31 LAB — CBC
HCT: 44 % (ref 36.0–46.0)
Hemoglobin: 14.9 g/dL (ref 12.0–15.0)
MCH: 32.3 pg (ref 26.0–34.0)
MCHC: 33.9 g/dL (ref 30.0–36.0)
MCV: 95.4 fL (ref 80.0–100.0)
Platelets: 206 10*3/uL (ref 150–400)
RBC: 4.61 MIL/uL (ref 3.87–5.11)
RDW: 12.5 % (ref 11.5–15.5)
WBC: 3.7 10*3/uL — ABNORMAL LOW (ref 4.0–10.5)
nRBC: 0 % (ref 0.0–0.2)

## 2023-05-31 LAB — URINALYSIS, ROUTINE W REFLEX MICROSCOPIC
Bilirubin Urine: NEGATIVE
Glucose, UA: NEGATIVE mg/dL
Hgb urine dipstick: NEGATIVE
Ketones, ur: 15 mg/dL — AB
Leukocytes,Ua: NEGATIVE
Nitrite: NEGATIVE
Protein, ur: NEGATIVE mg/dL
Specific Gravity, Urine: 1.028 (ref 1.005–1.030)
pH: 6 (ref 5.0–8.0)

## 2023-05-31 LAB — RESP PANEL BY RT-PCR (RSV, FLU A&B, COVID)  RVPGX2
Influenza A by PCR: NEGATIVE
Influenza B by PCR: NEGATIVE
Resp Syncytial Virus by PCR: NEGATIVE
SARS Coronavirus 2 by RT PCR: NEGATIVE

## 2023-05-31 LAB — LIPASE, BLOOD: Lipase: 33 U/L (ref 11–51)

## 2023-05-31 LAB — MAGNESIUM: Magnesium: 2 mg/dL (ref 1.7–2.4)

## 2023-05-31 MED ORDER — POTASSIUM CHLORIDE CRYS ER 20 MEQ PO TBCR
40.0000 meq | EXTENDED_RELEASE_TABLET | Freq: Once | ORAL | Status: AC
  Administered 2023-05-31: 40 meq via ORAL
  Filled 2023-05-31: qty 2

## 2023-05-31 MED ORDER — SODIUM CHLORIDE 0.9 % IV BOLUS
1000.0000 mL | Freq: Once | INTRAVENOUS | Status: AC
  Administered 2023-05-31: 1000 mL via INTRAVENOUS

## 2023-05-31 MED ORDER — POTASSIUM CHLORIDE CRYS ER 20 MEQ PO TBCR
20.0000 meq | EXTENDED_RELEASE_TABLET | Freq: Every day | ORAL | 0 refills | Status: AC
Start: 2023-06-01 — End: ?
  Filled 2023-05-31: qty 3, 3d supply, fill #0

## 2023-05-31 MED ORDER — IOHEXOL 300 MG/ML  SOLN
100.0000 mL | Freq: Once | INTRAMUSCULAR | Status: AC | PRN
  Administered 2023-05-31: 100 mL via INTRAVENOUS

## 2023-05-31 MED ORDER — ONDANSETRON HCL 4 MG/2ML IJ SOLN
4.0000 mg | Freq: Once | INTRAMUSCULAR | Status: AC
  Administered 2023-05-31: 4 mg via INTRAVENOUS
  Filled 2023-05-31: qty 2

## 2023-05-31 MED ORDER — ALUM & MAG HYDROXIDE-SIMETH 200-200-20 MG/5ML PO SUSP
30.0000 mL | Freq: Once | ORAL | Status: AC
  Administered 2023-05-31: 30 mL via ORAL
  Filled 2023-05-31: qty 30

## 2023-05-31 MED ORDER — AMOXICILLIN-POT CLAVULANATE 875-125 MG PO TABS
1.0000 | ORAL_TABLET | Freq: Two times a day (BID) | ORAL | 0 refills | Status: DC
  Filled 2023-05-31: qty 14, 7d supply, fill #0

## 2023-05-31 MED ORDER — FAMOTIDINE 20 MG PO TABS
20.0000 mg | ORAL_TABLET | Freq: Once | ORAL | Status: AC
  Administered 2023-05-31: 20 mg via ORAL
  Filled 2023-05-31: qty 1

## 2023-05-31 MED ORDER — AMOXICILLIN-POT CLAVULANATE 875-125 MG PO TABS
1.0000 | ORAL_TABLET | Freq: Once | ORAL | Status: AC
  Administered 2023-05-31: 1 via ORAL
  Filled 2023-05-31: qty 1

## 2023-05-31 NOTE — Discharge Instructions (Addendum)
 As discussed, CT scan concerning for diverticulitis.  Suspect that this is most likely cause of your symptoms.  Will place on antibiotic for this.  Recommend dietary changes as described in your instructions attached to your discharge papers.  Recommend follow-up with your primary care for reassessment.  Please do not hesitate to return if the worrisome signs and symptoms were discussed become apparent.

## 2023-05-31 NOTE — ED Notes (Signed)
 Pt aware of the need for a urine... Unable to currently provide the sample.Marland KitchenMarland Kitchen

## 2023-05-31 NOTE — ED Triage Notes (Signed)
 In for eval of nausea, inability to eat for 3 days, and mucus-stools. Felt like her acid reflux was "acting up". General fatigue and weakness. Had knee replacement approx 3 weeks ago, healed well.

## 2023-05-31 NOTE — ED Provider Notes (Signed)
 [place] EMERGENCY DEPARTMENT AT [facility] Provider Note   CSN: [id number] Arrival date & time: 05/31/23  0913     History  Chief Complaint  Patient presents with   Nausea    Olivia Rubio is a 68 y.o. female.  HPI   68 year old female presents emergency department with complaints of nausea, decreased p.o. intake, left lower abdominal pain.  States that she has been dealing with symptoms ever since around Wednesday.  Denies any fevers but does state that she had some chills at home.  Denies any urinary symptoms but has had loose bowel movements described as mucousy send symptom onset.  States he has a history of diverticulitis and states this feels somewhat similar although pain is slightly worse.  Denies any fevers, chest pain, vomiting.  States she did have Zofran  at home but taking of this medicine made her more nauseous.  Presents emergency department for further assessment.  Past medical history significant for rheumatoid arthritis, hypothyroidism, GERD, diverticulitis, acid reflux, other  Home Medications Prior to Admission medications   Medication Sig Start Date End Date Taking? Authorizing Provider  amoxicillin  (AMOXIL ) 500 MG capsule Take 1 capsule (500 mg total) by mouth 3 (three) times daily until gone 03/04/23     amoxicillin -clavulanate (AUGMENTIN ) 875-125 MG tablet Take 1 tablet by mouth every 12 (twelve) hours for 10 days Patient not taking: Reported on 05/15/2023 11/14/21     amoxicillin -clavulanate (AUGMENTIN ) 875-125 MG tablet Take 1 tablet by mouth 2 (two) times daily. Patient not taking: Reported on 05/15/2023 01/27/23     celecoxib  (CELEBREX ) 200 MG capsule Take 1 capsule (200 mg total) by mouth daily with food 03/17/23     COVID-19 At Home Antigen Test [facility] COVID-19 HOME TEST) KIT use as directed 06/08/20   Elora Hales, [facility]  cyclobenzaprine  (FLEXERIL ) 5 MG tablet Take 1 tablet (5 mg total) by mouth 3 (three) times daily as needed. For muscle  spasms 08/10/22     dicyclomine  (BENTYL ) 20 MG tablet Take 1 tablet (20 mg total) by mouth 3 (three) times daily as needed. 11/28/21     fluticasone  (FLONASE ) 50 MCG/ACT nasal spray Place 1 spray into both nostrils daily. 04/08/23     HYDROcodone -acetaminophen  (NORCO/VICODIN) 5-325 MG tablet Take 1 tablet by mouth 2 (two) times daily as needed. 03/12/22     lidocaine  (LIDODERM ) 5 % Place 1 patch onto the skin daily. Remove & Discard patch within 12 hours or as directed by MD 05/15/23   [place] Butter, PA  meloxicam  (MOBIC ) 15 MG tablet Take 1 tablet (15 mg total) by mouth daily. 12/27/22     methocarbamol  (ROBAXIN ) 500 MG tablet Take 1 tablet (500 mg total) by mouth every 8 (eight) hours as needed for muscle spasms. 05/19/23     naproxen  (EC NAPROSYN ) 500 MG EC tablet Take 1 tablet (500 mg total) by mouth every 12 (twelve) hours. 12/17/22     ondansetron  (ZOFRAN ) 4 MG tablet Take 1 tablet (4 mg total) by mouth every 6 (six) hours as needed for nausea. 04/11/23     ondansetron  (ZOFRAN -ODT) 4 MG disintegrating tablet Dissolve 1 tablet (4 mg total) in mouth every 8 (eight) hours as needed for nausea or vomiting. 11/26/22   Menshew, Raye Cai, PA-C  oxyCODONE  (OXY IR/ROXICODONE ) 5 MG immediate release tablet Take 1 tablet (5 mg total) by mouth 2 (two) times daily as needed for severe pain 05/28/23     pantoprazole  (PROTONIX ) 40 MG tablet TAKE 1  TABLET BY MOUTH TWICE DAILY 04/04/23     predniSONE  (DELTASONE ) 5 MG tablet Take 5 mg by mouth daily with breakfast.     [provider]  predniSONE  (DELTASONE ) 5 MG tablet Take 1 tablet (5 mg total) by mouth daily. 02/14/23     sarilumab  (KEVZARA ) 200 MG/1. SOSY 1.14 ml Subcutaneous bi-weekly 30 days 10/18/21     traMADol  (ULTRAM ) 50 MG tablet Take 1-2 tablets (50-100 mg total) by mouth every 6 (six) hours as needed for moderate pain. 04/11/23         Allergies    Moxifloxacin and Methylprednisolone     Review of Systems   Review of Systems  All  other systems reviewed and are negative.   Physical Exam Updated Vital Signs BP 104/67   Pulse 89   Temp 98.6 F (37 C)   Resp 19   Ht 5\' 7"  (1.702 m)   Wt 96.2 kg   SpO2 99%   BMI 33.20 kg/m  Physical Exam Vitals and nursing note reviewed.  Constitutional:      General: She is not in acute distress.    Appearance: She is well-developed.  HENT:     Head: Normocephalic and atraumatic.  Eyes:     Conjunctiva/sclera: Conjunctivae normal.  Cardiovascular:     Rate and Rhythm: Normal rate and regular rhythm.  Pulmonary:     Effort: Pulmonary effort is normal. No respiratory distress.     Breath sounds: Normal breath sounds.  Abdominal:     Palpations: Abdomen is soft.     Tenderness: There is abdominal tenderness. There is no right CVA tenderness or left CVA tenderness.     Comments: Left lower quadrant tenderness.  No guarding or rebound tenderness.  Musculoskeletal:        General: No swelling.     Cervical back: Neck supple.  Skin:    General: Skin is warm and dry.     Capillary Refill: Capillary refill takes less than 2 seconds.  Neurological:     Mental Status: She is alert.  Psychiatric:        Mood and Affect: Mood normal.     ED Results / Procedures / Treatments   Labs (all labs ordered are listed, but only abnormal results are displayed) Labs Reviewed  CBC - Abnormal; Notable for the following components:      Result Value   WBC 3.7 (*)    All other components within normal limits  COMPREHENSIVE METABOLIC PANEL WITH GFR - Abnormal; Notable for the following components:   Potassium 3.3 (*)    Glucose, Bld 102 (*)    AST 47 (*)    ALT 47 (*)    Alkaline Phosphatase 137 (*)    Total Bilirubin 1.4 (*)    All other components within normal limits  URINALYSIS, ROUTINE W REFLEX MICROSCOPIC - Abnormal; Notable for the following components:   Ketones, ur 15 (*)    All other components within normal limits  RESP PANEL BY RT-PCR (RSV, FLU A&B, COVID)  RVPGX2   LIPASE, BLOOD  MAGNESIUM    EKG None  Radiology CT ABDOMEN PELVIS W CONTRAST Result Date: 05/31/2023 CLINICAL DATA:  Left lower quadrant abdominal pain. History of diverticulitis. EXAM: CT ABDOMEN AND PELVIS WITH CONTRAST TECHNIQUE: Multidetector CT imaging of the abdomen and pelvis was performed using the standard protocol following bolus administration of intravenous contrast. RADIATION DOSE REDUCTION: This exam was performed according to the departmental dose-optimization program which includes automated exposure control,  adjustment of the mA and/or kV according to patient size and/or use of iterative reconstruction technique. CONTRAST:  OMNIPAQUE  IOHEXOL  300 MG/ML  SOLN COMPARISON:  April 13, 2023. FINDINGS: Lower chest: No acute abnormality. Hepatobiliary: No cholelithiasis or biliary dilatation is noted. Stable hepatic cysts. Pancreas: Unremarkable. No pancreatic ductal dilatation or surrounding inflammatory changes. Spleen: Normal in size without focal abnormality. Adrenals/Urinary Tract: Adrenal glands appear normal. Left renal cyst is noted. No hydronephrosis or renal obstruction is noted. Urinary bladder is unremarkable. Stomach/Bowel: Small sliding-type hiatal hernia is noted. There is no evidence of bowel obstruction. The appendix is unremarkable. Diverticulosis of descending and sigmoid colon is again noted. There appears to be mild focal diverticulitis involving the distal sigmoid colon without abscess formation, best seen on image number 76 of series 2. Vascular/Lymphatic: No significant vascular abnormality is noted. Stable mildly enlarged retroperitoneal lymph nodes are noted which most likely are reactive in etiology. Reproductive: Status post hysterectomy. No adnexal masses. Other: No ascites or hernia is noted. Musculoskeletal: Old L1 compression fracture is noted. No acute osseous abnormality is noted. IMPRESSION: Mild focal diverticulitis is seen involving the distal sigmoid  colon without abscess formation. Small sliding-type hiatal hernia. Electronically Signed   By: Rosalene Colon M.D.   On: 05/31/2023 10:56    Procedures Procedures    Medications Ordered in ED Medications  ondansetron  (ZOFRAN ) injection 4 mg (4 mg Intravenous Given 05/31/23 0954)  sodium chloride  0.9 % bolus 1,000 mL (0 mLs Intravenous Stopped 05/31/23 1100)  iohexol  (OMNIPAQUE ) 300 MG/ML solution 100 mL (100 mLs Intravenous Contrast Given 05/31/23 1036)    ED Course/ Medical Decision Making/ A&P                                 Medical Decision Making Amount and/or Complexity of Data Reviewed Labs: ordered. Radiology: ordered.  Risk OTC drugs. Prescription drug management.   This patient presents to the ED for concern of abdominal pain, this involves an extensive number of treatment options, and is a complaint that carries with it a high risk of complications and morbidity.  The differential diagnosis includes gastritis, PUD, cholecystitis, CBD pathology, SBO/LBO, volvulus, diverticulitis, appendicitis, cystitis, nephrolithiasis, other   Co morbidities that complicate the patient evaluation  See HPI   Additional history obtained:  Additional history obtained from EMR External records from outside source obtained and reviewed including hospital records   Lab Tests:  I Ordered, and personally interpreted labs.  The pertinent results include: Leukopenia 3.7.  No evidence of anemia.  Platelets within range.  Lipase within normal limits.  Viral testing negative.  UA with 15 ketones otherwise unremarkable.  Mild hypokalemia 3.3 otherwise, electrolytes with normal limits.  Very minimal transaminitis, elevation of alk phos and elevation of total bilirubin 47, 47, 37, 1.4.  No renal dysfunction.  Magnesium normal   Imaging Studies ordered:  I ordered imaging studies including CT abdomen pelvis I independently visualized and interpreted imaging which showed mild uncomplicated  diverticulitis.  Sliding hiatal hernia. I agree with the radiologist interpretation   Cardiac Monitoring: / EKG:  The patient was maintained on a cardiac monitor.  I personally viewed and interpreted the cardiac monitored which showed an underlying rhythm of: Sinus rhythm   Consultations Obtained:  N/a   Problem List / ED Course / Critical interventions / Medication management  Diverticulitis I ordered medication including 1 L normal saline, Zofran    Reevaluation of  the patient after these medicines showed that the patient improved I have reviewed the patients home medicines and have made adjustments as needed   Social Determinants of Health:  Denies tobacco, licit drug use.   Test / Admission - Considered:  Diverticulitis Vitals signs within normal range and stable throughout visit. Laboratory/imaging studies significant for: See above 68 year old female presents emergency department with complaints of nausea, decreased p.o. intake, left lower abdominal pain.  States that she has been dealing with symptoms ever since around Wednesday.  Denies any fevers but does state that she had some chills at home.  Denies any urinary symptoms but has had loose bowel movements described as mucousy send symptom onset.  States he has a history of diverticulitis and states this feels somewhat similar although pain is slightly worse.  Denies any fevers, chest pain, vomiting.  States she did have Zofran  at home but taking of this medicine made her more nauseous.  Presents emergency department for further assessment. On exam, focal tenderness left lower quadrant of abdomen.  Work today reassuring.  No leukocytosis present.  Labs concerning for dehydration with ketones present in the urine, mild elevation in bilirubin, brain function test.  CT imaging concerning for mild uncomplicated diverticulitis is most likely cause of symptoms.  Patient does not meet SIRS criteria.  Given patient's history of  allergy  to fluoroquinolones, states she has done well with monotherapy of Augmentin  in the past.  Will begin similar medication, recommend dietary/lifestyle modifications and recommend follow-up with primary care in the outpatient setting.  Treatment plan discussed with patient and she acknowledged understanding was agreeable to said plan.  Patient well-appearing, afebrile in no acute distress. Worrisome signs and symptoms were discussed with the patient, and the patient acknowledged understanding to return to the ED if noticed. Patient was stable upon discharge.          Final Clinical Impression(s) / ED Diagnoses Final diagnoses:  None    Rx / DC Orders ED Discharge Orders     None         [place] Butter, [place] 05/31/23 1142    Quinn Bucco, DO 06/02/23 [phone]

## 2023-05-31 NOTE — ED Notes (Signed)
 Discharge paperwork given and verbally understood.

## 2023-06-02 ENCOUNTER — Other Ambulatory Visit (HOSPITAL_COMMUNITY): Payer: Self-pay

## 2023-06-03 ENCOUNTER — Other Ambulatory Visit (HOSPITAL_COMMUNITY): Payer: Self-pay

## 2023-06-03 DIAGNOSIS — R110 Nausea: Secondary | ICD-10-CM | POA: Diagnosis not present

## 2023-06-03 DIAGNOSIS — K5792 Diverticulitis of intestine, part unspecified, without perforation or abscess without bleeding: Secondary | ICD-10-CM | POA: Diagnosis not present

## 2023-06-03 MED ORDER — METRONIDAZOLE 500 MG PO TABS
500.0000 mg | ORAL_TABLET | Freq: Three times a day (TID) | ORAL | 0 refills | Status: AC
  Filled 2023-06-03: qty 21, 7d supply, fill #0

## 2023-06-03 MED ORDER — ONDANSETRON 4 MG PO TBDP
4.0000 mg | ORAL_TABLET | Freq: Four times a day (QID) | ORAL | 0 refills | Status: AC
  Filled 2023-06-03: qty 120, 30d supply, fill #0

## 2023-06-05 DIAGNOSIS — R2689 Other abnormalities of gait and mobility: Secondary | ICD-10-CM | POA: Diagnosis not present

## 2023-06-05 DIAGNOSIS — M25561 Pain in right knee: Secondary | ICD-10-CM | POA: Diagnosis not present

## 2023-06-10 DIAGNOSIS — R7401 Elevation of levels of liver transaminase levels: Secondary | ICD-10-CM | POA: Diagnosis not present

## 2023-06-10 DIAGNOSIS — R109 Unspecified abdominal pain: Secondary | ICD-10-CM | POA: Diagnosis not present

## 2023-06-10 DIAGNOSIS — K5790 Diverticulosis of intestine, part unspecified, without perforation or abscess without bleeding: Secondary | ICD-10-CM | POA: Diagnosis not present

## 2023-06-11 DIAGNOSIS — R2689 Other abnormalities of gait and mobility: Secondary | ICD-10-CM | POA: Diagnosis not present

## 2023-06-11 DIAGNOSIS — M25561 Pain in right knee: Secondary | ICD-10-CM | POA: Diagnosis not present

## 2023-06-12 ENCOUNTER — Other Ambulatory Visit: Payer: Self-pay

## 2023-06-12 DIAGNOSIS — Z5189 Encounter for other specified aftercare: Secondary | ICD-10-CM | POA: Diagnosis not present

## 2023-06-13 ENCOUNTER — Other Ambulatory Visit: Payer: Self-pay

## 2023-06-13 DIAGNOSIS — R531 Weakness: Secondary | ICD-10-CM | POA: Diagnosis not present

## 2023-06-16 DIAGNOSIS — M25561 Pain in right knee: Secondary | ICD-10-CM | POA: Diagnosis not present

## 2023-06-16 DIAGNOSIS — R2689 Other abnormalities of gait and mobility: Secondary | ICD-10-CM | POA: Diagnosis not present

## 2023-06-17 DIAGNOSIS — L814 Other melanin hyperpigmentation: Secondary | ICD-10-CM | POA: Diagnosis not present

## 2023-06-17 DIAGNOSIS — Z85828 Personal history of other malignant neoplasm of skin: Secondary | ICD-10-CM | POA: Diagnosis not present

## 2023-06-17 DIAGNOSIS — L821 Other seborrheic keratosis: Secondary | ICD-10-CM | POA: Diagnosis not present

## 2023-06-17 DIAGNOSIS — D2261 Melanocytic nevi of right upper limb, including shoulder: Secondary | ICD-10-CM | POA: Diagnosis not present

## 2023-06-17 DIAGNOSIS — L905 Scar conditions and fibrosis of skin: Secondary | ICD-10-CM | POA: Diagnosis not present

## 2023-06-17 DIAGNOSIS — D225 Melanocytic nevi of trunk: Secondary | ICD-10-CM | POA: Diagnosis not present

## 2023-06-17 DIAGNOSIS — D1801 Hemangioma of skin and subcutaneous tissue: Secondary | ICD-10-CM | POA: Diagnosis not present

## 2023-06-18 ENCOUNTER — Other Ambulatory Visit: Payer: Self-pay

## 2023-06-18 DIAGNOSIS — H6983 Other specified disorders of Eustachian tube, bilateral: Secondary | ICD-10-CM | POA: Diagnosis not present

## 2023-06-18 MED ORDER — PREDNISONE 10 MG PO TABS
ORAL_TABLET | ORAL | 0 refills | Status: AC
  Filled 2023-06-18: qty 21, 6d supply, fill #0

## 2023-06-19 DIAGNOSIS — M25561 Pain in right knee: Secondary | ICD-10-CM | POA: Diagnosis not present

## 2023-06-19 DIAGNOSIS — R2689 Other abnormalities of gait and mobility: Secondary | ICD-10-CM | POA: Diagnosis not present

## 2023-06-20 ENCOUNTER — Other Ambulatory Visit: Payer: Self-pay

## 2023-06-23 ENCOUNTER — Other Ambulatory Visit: Payer: Self-pay

## 2023-06-23 MED ORDER — PREDNISONE 5 MG PO TABS
5.0000 mg | ORAL_TABLET | Freq: Every day | ORAL | 0 refills | Status: DC
  Filled 2023-06-23: qty 90, 90d supply, fill #0

## 2023-06-24 DIAGNOSIS — R2689 Other abnormalities of gait and mobility: Secondary | ICD-10-CM | POA: Diagnosis not present

## 2023-06-24 DIAGNOSIS — M25561 Pain in right knee: Secondary | ICD-10-CM | POA: Diagnosis not present

## 2023-06-26 ENCOUNTER — Other Ambulatory Visit: Payer: Self-pay

## 2023-06-26 DIAGNOSIS — M25561 Pain in right knee: Secondary | ICD-10-CM | POA: Diagnosis not present

## 2023-06-26 DIAGNOSIS — R2689 Other abnormalities of gait and mobility: Secondary | ICD-10-CM | POA: Diagnosis not present

## 2023-06-26 MED ORDER — PLENVU 140 G PO SOLR
ORAL | 0 refills | Status: DC
  Filled 2023-06-26: qty 3, 1d supply, fill #0

## 2023-07-01 DIAGNOSIS — R2689 Other abnormalities of gait and mobility: Secondary | ICD-10-CM | POA: Diagnosis not present

## 2023-07-01 DIAGNOSIS — M25561 Pain in right knee: Secondary | ICD-10-CM | POA: Diagnosis not present

## 2023-07-02 DIAGNOSIS — K219 Gastro-esophageal reflux disease without esophagitis: Secondary | ICD-10-CM | POA: Diagnosis not present

## 2023-07-02 DIAGNOSIS — R110 Nausea: Secondary | ICD-10-CM | POA: Diagnosis not present

## 2023-07-02 DIAGNOSIS — Z8719 Personal history of other diseases of the digestive system: Secondary | ICD-10-CM | POA: Diagnosis not present

## 2023-07-03 ENCOUNTER — Other Ambulatory Visit: Payer: Self-pay

## 2023-07-03 DIAGNOSIS — R2689 Other abnormalities of gait and mobility: Secondary | ICD-10-CM | POA: Diagnosis not present

## 2023-07-03 DIAGNOSIS — M25561 Pain in right knee: Secondary | ICD-10-CM | POA: Diagnosis not present

## 2023-07-03 MED ORDER — BISACODYL EC 5 MG PO TBEC
DELAYED_RELEASE_TABLET | ORAL | 0 refills | Status: AC
  Filled 2023-07-03: qty 4, 1d supply, fill #0

## 2023-07-03 MED ORDER — PEG 3350-KCL-NA BICARB-NACL 420 G PO SOLR
4000.0000 mL | ORAL | 0 refills | Status: AC
  Filled 2023-07-03: qty 4000, 1d supply, fill #0

## 2023-07-08 DIAGNOSIS — M25561 Pain in right knee: Secondary | ICD-10-CM | POA: Diagnosis not present

## 2023-07-08 DIAGNOSIS — R2689 Other abnormalities of gait and mobility: Secondary | ICD-10-CM | POA: Diagnosis not present

## 2023-07-10 DIAGNOSIS — K317 Polyp of stomach and duodenum: Secondary | ICD-10-CM | POA: Diagnosis not present

## 2023-07-10 DIAGNOSIS — Z860101 Personal history of adenomatous and serrated colon polyps: Secondary | ICD-10-CM | POA: Diagnosis not present

## 2023-07-10 DIAGNOSIS — K5730 Diverticulosis of large intestine without perforation or abscess without bleeding: Secondary | ICD-10-CM | POA: Diagnosis not present

## 2023-07-10 DIAGNOSIS — K648 Other hemorrhoids: Secondary | ICD-10-CM | POA: Diagnosis not present

## 2023-07-10 DIAGNOSIS — Q399 Congenital malformation of esophagus, unspecified: Secondary | ICD-10-CM | POA: Diagnosis not present

## 2023-07-10 DIAGNOSIS — Z09 Encounter for follow-up examination after completed treatment for conditions other than malignant neoplasm: Secondary | ICD-10-CM | POA: Diagnosis not present

## 2023-07-10 DIAGNOSIS — K644 Residual hemorrhoidal skin tags: Secondary | ICD-10-CM | POA: Diagnosis not present

## 2023-07-10 DIAGNOSIS — K449 Diaphragmatic hernia without obstruction or gangrene: Secondary | ICD-10-CM | POA: Diagnosis not present

## 2023-07-10 DIAGNOSIS — R110 Nausea: Secondary | ICD-10-CM | POA: Diagnosis not present

## 2023-07-10 DIAGNOSIS — K3189 Other diseases of stomach and duodenum: Secondary | ICD-10-CM | POA: Diagnosis not present

## 2023-07-10 DIAGNOSIS — D123 Benign neoplasm of transverse colon: Secondary | ICD-10-CM | POA: Diagnosis not present

## 2023-07-14 DIAGNOSIS — I10 Essential (primary) hypertension: Secondary | ICD-10-CM | POA: Diagnosis not present

## 2023-07-14 DIAGNOSIS — K317 Polyp of stomach and duodenum: Secondary | ICD-10-CM | POA: Diagnosis not present

## 2023-07-14 DIAGNOSIS — D123 Benign neoplasm of transverse colon: Secondary | ICD-10-CM | POA: Diagnosis not present

## 2023-07-14 DIAGNOSIS — M069 Rheumatoid arthritis, unspecified: Secondary | ICD-10-CM | POA: Diagnosis not present

## 2023-07-14 DIAGNOSIS — M8000XA Age-related osteoporosis with current pathological fracture, unspecified site, initial encounter for fracture: Secondary | ICD-10-CM | POA: Diagnosis not present

## 2023-07-14 DIAGNOSIS — R2689 Other abnormalities of gait and mobility: Secondary | ICD-10-CM | POA: Diagnosis not present

## 2023-07-14 DIAGNOSIS — M25561 Pain in right knee: Secondary | ICD-10-CM | POA: Diagnosis not present

## 2023-07-15 ENCOUNTER — Other Ambulatory Visit: Payer: Self-pay

## 2023-07-15 MED ORDER — HYDROCODONE-ACETAMINOPHEN 5-325 MG PO TABS
1.0000 | ORAL_TABLET | Freq: Two times a day (BID) | ORAL | 0 refills | Status: AC | PRN
  Filled 2023-07-15: qty 14, 7d supply, fill #0

## 2023-07-16 DIAGNOSIS — J310 Chronic rhinitis: Secondary | ICD-10-CM | POA: Diagnosis not present

## 2023-07-16 DIAGNOSIS — J301 Allergic rhinitis due to pollen: Secondary | ICD-10-CM | POA: Diagnosis not present

## 2023-07-16 DIAGNOSIS — H6983 Other specified disorders of Eustachian tube, bilateral: Secondary | ICD-10-CM | POA: Diagnosis not present

## 2023-07-17 ENCOUNTER — Other Ambulatory Visit: Payer: Self-pay

## 2023-07-17 MED ORDER — AZELASTINE HCL 0.1 % NA SOLN
1.0000 | Freq: Two times a day (BID) | NASAL | 11 refills | Status: AC
  Filled 2023-07-17: qty 30, 50d supply, fill #0
  Filled 2023-08-10 – 2023-09-09 (×2): qty 30, 50d supply, fill #1
  Filled 2023-11-10: qty 30, 50d supply, fill #2
  Filled 2023-12-29: qty 30, 50d supply, fill #3

## 2023-07-22 DIAGNOSIS — M25561 Pain in right knee: Secondary | ICD-10-CM | POA: Diagnosis not present

## 2023-07-22 DIAGNOSIS — R2689 Other abnormalities of gait and mobility: Secondary | ICD-10-CM | POA: Diagnosis not present

## 2023-07-24 DIAGNOSIS — R2689 Other abnormalities of gait and mobility: Secondary | ICD-10-CM | POA: Diagnosis not present

## 2023-07-24 DIAGNOSIS — M25561 Pain in right knee: Secondary | ICD-10-CM | POA: Diagnosis not present

## 2023-08-10 ENCOUNTER — Other Ambulatory Visit: Payer: Self-pay

## 2023-08-11 ENCOUNTER — Other Ambulatory Visit: Payer: Self-pay

## 2023-08-11 MED ORDER — PREDNISONE 5 MG PO TABS
5.0000 mg | ORAL_TABLET | Freq: Every day | ORAL | 0 refills | Status: DC
  Filled 2023-10-01: qty 90, 90d supply, fill #0

## 2023-08-14 DIAGNOSIS — M069 Rheumatoid arthritis, unspecified: Secondary | ICD-10-CM | POA: Diagnosis not present

## 2023-08-14 DIAGNOSIS — I10 Essential (primary) hypertension: Secondary | ICD-10-CM | POA: Diagnosis not present

## 2023-08-14 DIAGNOSIS — M8000XA Age-related osteoporosis with current pathological fracture, unspecified site, initial encounter for fracture: Secondary | ICD-10-CM | POA: Diagnosis not present

## 2023-08-20 ENCOUNTER — Other Ambulatory Visit: Payer: Self-pay

## 2023-08-20 MED ORDER — AMOXICILLIN-POT CLAVULANATE 875-125 MG PO TABS
1.0000 | ORAL_TABLET | Freq: Two times a day (BID) | ORAL | 0 refills | Status: DC
  Filled 2023-08-20: qty 20, 10d supply, fill #0

## 2023-08-25 DIAGNOSIS — M4856XA Collapsed vertebra, not elsewhere classified, lumbar region, initial encounter for fracture: Secondary | ICD-10-CM | POA: Diagnosis not present

## 2023-08-25 DIAGNOSIS — M5136 Other intervertebral disc degeneration, lumbar region with discogenic back pain only: Secondary | ICD-10-CM | POA: Diagnosis not present

## 2023-08-25 DIAGNOSIS — M069 Rheumatoid arthritis, unspecified: Secondary | ICD-10-CM | POA: Diagnosis not present

## 2023-08-26 DIAGNOSIS — M5459 Other low back pain: Secondary | ICD-10-CM | POA: Diagnosis not present

## 2023-08-28 DIAGNOSIS — H9313 Tinnitus, bilateral: Secondary | ICD-10-CM | POA: Diagnosis not present

## 2023-08-28 DIAGNOSIS — H6983 Other specified disorders of Eustachian tube, bilateral: Secondary | ICD-10-CM | POA: Diagnosis not present

## 2023-08-28 DIAGNOSIS — J301 Allergic rhinitis due to pollen: Secondary | ICD-10-CM | POA: Diagnosis not present

## 2023-08-29 DIAGNOSIS — M5459 Other low back pain: Secondary | ICD-10-CM | POA: Diagnosis not present

## 2023-09-01 DIAGNOSIS — M5459 Other low back pain: Secondary | ICD-10-CM | POA: Diagnosis not present

## 2023-09-03 DIAGNOSIS — M5459 Other low back pain: Secondary | ICD-10-CM | POA: Diagnosis not present

## 2023-09-05 ENCOUNTER — Other Ambulatory Visit: Payer: Self-pay | Admitting: Family Medicine

## 2023-09-05 ENCOUNTER — Encounter: Payer: Self-pay | Admitting: Family Medicine

## 2023-09-05 ENCOUNTER — Other Ambulatory Visit (HOSPITAL_BASED_OUTPATIENT_CLINIC_OR_DEPARTMENT_OTHER): Payer: Self-pay

## 2023-09-05 DIAGNOSIS — Z0000 Encounter for general adult medical examination without abnormal findings: Secondary | ICD-10-CM | POA: Diagnosis not present

## 2023-09-05 DIAGNOSIS — D696 Thrombocytopenia, unspecified: Secondary | ICD-10-CM | POA: Diagnosis not present

## 2023-09-05 DIAGNOSIS — M8000XA Age-related osteoporosis with current pathological fracture, unspecified site, initial encounter for fracture: Secondary | ICD-10-CM | POA: Diagnosis not present

## 2023-09-05 DIAGNOSIS — R1032 Left lower quadrant pain: Secondary | ICD-10-CM

## 2023-09-05 DIAGNOSIS — M069 Rheumatoid arthritis, unspecified: Secondary | ICD-10-CM | POA: Diagnosis not present

## 2023-09-05 DIAGNOSIS — D84821 Immunodeficiency due to drugs: Secondary | ICD-10-CM | POA: Diagnosis not present

## 2023-09-05 DIAGNOSIS — Z79899 Other long term (current) drug therapy: Secondary | ICD-10-CM | POA: Diagnosis not present

## 2023-09-05 DIAGNOSIS — I10 Essential (primary) hypertension: Secondary | ICD-10-CM | POA: Diagnosis not present

## 2023-09-05 DIAGNOSIS — R7401 Elevation of levels of liver transaminase levels: Secondary | ICD-10-CM | POA: Diagnosis not present

## 2023-09-05 MED ORDER — AMOXICILLIN-POT CLAVULANATE 875-125 MG PO TABS
1.0000 | ORAL_TABLET | Freq: Two times a day (BID) | ORAL | 0 refills | Status: AC
  Filled 2023-09-05: qty 20, 10d supply, fill #0

## 2023-09-08 ENCOUNTER — Ambulatory Visit
Admission: RE | Admit: 2023-09-08 | Discharge: 2023-09-08 | Disposition: A | Discharge status: Home / Self Care | Source: Ambulatory Visit | Attending: Family Medicine | Admitting: Family Medicine

## 2023-09-08 DIAGNOSIS — R1032 Left lower quadrant pain: Secondary | ICD-10-CM

## 2023-09-08 DIAGNOSIS — K5730 Diverticulosis of large intestine without perforation or abscess without bleeding: Secondary | ICD-10-CM | POA: Diagnosis not present

## 2023-09-08 DIAGNOSIS — M5459 Other low back pain: Secondary | ICD-10-CM | POA: Diagnosis not present

## 2023-09-09 ENCOUNTER — Other Ambulatory Visit: Payer: Self-pay

## 2023-09-10 ENCOUNTER — Other Ambulatory Visit: Payer: Self-pay

## 2023-09-10 DIAGNOSIS — M5459 Other low back pain: Secondary | ICD-10-CM | POA: Diagnosis not present

## 2023-09-12 ENCOUNTER — Other Ambulatory Visit: Payer: Self-pay

## 2023-09-12 DIAGNOSIS — M542 Cervicalgia: Secondary | ICD-10-CM | POA: Diagnosis not present

## 2023-09-12 DIAGNOSIS — M25512 Pain in left shoulder: Secondary | ICD-10-CM | POA: Diagnosis not present

## 2023-09-12 DIAGNOSIS — M545 Low back pain, unspecified: Secondary | ICD-10-CM | POA: Diagnosis not present

## 2023-09-12 DIAGNOSIS — Z111 Encounter for screening for respiratory tuberculosis: Secondary | ICD-10-CM | POA: Diagnosis not present

## 2023-09-12 DIAGNOSIS — Z79899 Other long term (current) drug therapy: Secondary | ICD-10-CM | POA: Diagnosis not present

## 2023-09-12 DIAGNOSIS — Z6831 Body mass index (BMI) 31.0-31.9, adult: Secondary | ICD-10-CM | POA: Diagnosis not present

## 2023-09-12 DIAGNOSIS — G6289 Other specified polyneuropathies: Secondary | ICD-10-CM | POA: Diagnosis not present

## 2023-09-12 DIAGNOSIS — M8080XA Other osteoporosis with current pathological fracture, unspecified site, initial encounter for fracture: Secondary | ICD-10-CM | POA: Diagnosis not present

## 2023-09-12 DIAGNOSIS — E669 Obesity, unspecified: Secondary | ICD-10-CM | POA: Diagnosis not present

## 2023-09-12 DIAGNOSIS — M1991 Primary osteoarthritis, unspecified site: Secondary | ICD-10-CM | POA: Diagnosis not present

## 2023-09-12 DIAGNOSIS — M0589 Other rheumatoid arthritis with rheumatoid factor of multiple sites: Secondary | ICD-10-CM | POA: Diagnosis not present

## 2023-09-12 MED ORDER — PREDNISONE 1 MG PO TABS
4.0000 mg | ORAL_TABLET | Freq: Every day | ORAL | 3 refills | Status: AC
  Filled 2023-09-12: qty 120, 30d supply, fill #0
  Filled 2023-10-10: qty 120, 30d supply, fill #1
  Filled 2024-02-02: qty 120, 30d supply, fill #0
  Filled 2024-02-02: qty 120, 30d supply, fill #2
  Filled 2024-02-02: qty 120, 30d supply, fill #0

## 2023-09-14 DIAGNOSIS — I10 Essential (primary) hypertension: Secondary | ICD-10-CM | POA: Diagnosis not present

## 2023-09-14 DIAGNOSIS — M069 Rheumatoid arthritis, unspecified: Secondary | ICD-10-CM | POA: Diagnosis not present

## 2023-09-14 DIAGNOSIS — M8000XA Age-related osteoporosis with current pathological fracture, unspecified site, initial encounter for fracture: Secondary | ICD-10-CM | POA: Diagnosis not present

## 2023-09-16 DIAGNOSIS — M5459 Other low back pain: Secondary | ICD-10-CM | POA: Diagnosis not present

## 2023-09-18 DIAGNOSIS — K219 Gastro-esophageal reflux disease without esophagitis: Secondary | ICD-10-CM | POA: Diagnosis not present

## 2023-09-18 DIAGNOSIS — Z8719 Personal history of other diseases of the digestive system: Secondary | ICD-10-CM | POA: Diagnosis not present

## 2023-09-19 DIAGNOSIS — M5459 Other low back pain: Secondary | ICD-10-CM | POA: Diagnosis not present

## 2023-10-01 ENCOUNTER — Other Ambulatory Visit: Payer: Self-pay

## 2023-10-09 DIAGNOSIS — M1712 Unilateral primary osteoarthritis, left knee: Secondary | ICD-10-CM | POA: Diagnosis not present

## 2023-10-09 DIAGNOSIS — Z96651 Presence of right artificial knee joint: Secondary | ICD-10-CM | POA: Diagnosis not present

## 2023-10-11 ENCOUNTER — Other Ambulatory Visit: Payer: Self-pay

## 2023-10-14 DIAGNOSIS — M069 Rheumatoid arthritis, unspecified: Secondary | ICD-10-CM | POA: Diagnosis not present

## 2023-10-14 DIAGNOSIS — I10 Essential (primary) hypertension: Secondary | ICD-10-CM | POA: Diagnosis not present

## 2023-10-14 DIAGNOSIS — M8000XA Age-related osteoporosis with current pathological fracture, unspecified site, initial encounter for fracture: Secondary | ICD-10-CM | POA: Diagnosis not present

## 2023-10-16 ENCOUNTER — Other Ambulatory Visit (HOSPITAL_COMMUNITY): Payer: Self-pay

## 2023-10-16 ENCOUNTER — Other Ambulatory Visit: Payer: Self-pay

## 2023-10-16 DIAGNOSIS — N3001 Acute cystitis with hematuria: Secondary | ICD-10-CM | POA: Diagnosis not present

## 2023-10-16 DIAGNOSIS — R399 Unspecified symptoms and signs involving the genitourinary system: Secondary | ICD-10-CM | POA: Diagnosis not present

## 2023-10-16 MED ORDER — PHENAZOPYRIDINE HCL 200 MG PO TABS
200.0000 mg | ORAL_TABLET | Freq: Three times a day (TID) | ORAL | 0 refills | Status: AC
  Filled 2023-10-16: qty 6, 2d supply, fill #0

## 2023-10-16 MED ORDER — NITROFURANTOIN MONOHYD MACRO 100 MG PO CAPS
100.0000 mg | ORAL_CAPSULE | Freq: Two times a day (BID) | ORAL | 0 refills | Status: AC
  Filled 2023-10-16 (×2): qty 14, 7d supply, fill #0

## 2023-10-26 DIAGNOSIS — M545 Low back pain, unspecified: Secondary | ICD-10-CM | POA: Diagnosis not present

## 2023-11-10 ENCOUNTER — Other Ambulatory Visit: Payer: Self-pay

## 2023-11-14 DIAGNOSIS — M069 Rheumatoid arthritis, unspecified: Secondary | ICD-10-CM | POA: Diagnosis not present

## 2023-11-14 DIAGNOSIS — I10 Essential (primary) hypertension: Secondary | ICD-10-CM | POA: Diagnosis not present

## 2023-11-14 DIAGNOSIS — M8000XA Age-related osteoporosis with current pathological fracture, unspecified site, initial encounter for fracture: Secondary | ICD-10-CM | POA: Diagnosis not present

## 2023-11-19 DIAGNOSIS — M5416 Radiculopathy, lumbar region: Secondary | ICD-10-CM | POA: Diagnosis not present

## 2023-12-04 ENCOUNTER — Other Ambulatory Visit (HOSPITAL_COMMUNITY): Payer: Self-pay

## 2023-12-04 DIAGNOSIS — J040 Acute laryngitis: Secondary | ICD-10-CM | POA: Diagnosis not present

## 2023-12-04 DIAGNOSIS — R0981 Nasal congestion: Secondary | ICD-10-CM | POA: Diagnosis not present

## 2023-12-04 DIAGNOSIS — R051 Acute cough: Secondary | ICD-10-CM | POA: Diagnosis not present

## 2023-12-04 DIAGNOSIS — J029 Acute pharyngitis, unspecified: Secondary | ICD-10-CM | POA: Diagnosis not present

## 2023-12-04 MED ORDER — PROMETHAZINE-DM 6.25-15 MG/5ML PO SYRP
5.0000 mL | ORAL_SOLUTION | Freq: Every day | ORAL | 0 refills | Status: AC
  Filled 2023-12-04: qty 50, 10d supply, fill #0

## 2023-12-08 ENCOUNTER — Other Ambulatory Visit: Payer: Self-pay

## 2023-12-08 ENCOUNTER — Other Ambulatory Visit (HOSPITAL_COMMUNITY): Payer: Self-pay

## 2023-12-08 DIAGNOSIS — R059 Cough, unspecified: Secondary | ICD-10-CM | POA: Diagnosis not present

## 2023-12-08 DIAGNOSIS — H109 Unspecified conjunctivitis: Secondary | ICD-10-CM | POA: Diagnosis not present

## 2023-12-08 MED ORDER — TOBRAMYCIN 0.3 % OP SOLN
1.0000 [drp] | Freq: Four times a day (QID) | OPHTHALMIC | 0 refills | Status: AC
  Filled 2023-12-08 (×2): qty 5, 25d supply, fill #0

## 2023-12-08 MED ORDER — DOXYCYCLINE HYCLATE 100 MG PO TABS
100.0000 mg | ORAL_TABLET | Freq: Two times a day (BID) | ORAL | 0 refills | Status: AC
  Filled 2023-12-08 (×2): qty 20, 10d supply, fill #0

## 2023-12-09 ENCOUNTER — Other Ambulatory Visit: Payer: Self-pay

## 2023-12-14 DIAGNOSIS — M8000XA Age-related osteoporosis with current pathological fracture, unspecified site, initial encounter for fracture: Secondary | ICD-10-CM | POA: Diagnosis not present

## 2023-12-14 DIAGNOSIS — I10 Essential (primary) hypertension: Secondary | ICD-10-CM | POA: Diagnosis not present

## 2023-12-14 DIAGNOSIS — M069 Rheumatoid arthritis, unspecified: Secondary | ICD-10-CM | POA: Diagnosis not present

## 2023-12-25 DIAGNOSIS — M5416 Radiculopathy, lumbar region: Secondary | ICD-10-CM | POA: Diagnosis not present

## 2023-12-29 ENCOUNTER — Other Ambulatory Visit: Payer: Self-pay

## 2023-12-30 ENCOUNTER — Other Ambulatory Visit: Payer: Self-pay

## 2023-12-31 ENCOUNTER — Other Ambulatory Visit: Payer: Self-pay

## 2023-12-31 ENCOUNTER — Other Ambulatory Visit (HOSPITAL_COMMUNITY): Payer: Self-pay

## 2023-12-31 MED ORDER — PREDNISONE 5 MG PO TABS
5.0000 mg | ORAL_TABLET | Freq: Every day | ORAL | 0 refills | Status: AC
  Filled 2023-12-31: qty 90, 90d supply, fill #0

## 2024-01-01 ENCOUNTER — Other Ambulatory Visit: Payer: Self-pay

## 2024-01-29 ENCOUNTER — Other Ambulatory Visit: Payer: Self-pay

## 2024-01-29 ENCOUNTER — Encounter: Payer: Self-pay | Admitting: Pharmacist

## 2024-01-29 MED ORDER — DICYCLOMINE HCL 20 MG PO TABS
20.0000 mg | ORAL_TABLET | Freq: Three times a day (TID) | ORAL | 1 refills | Status: AC
  Filled 2024-01-29: qty 90, 30d supply, fill #0

## 2024-01-29 MED ORDER — ONDANSETRON 4 MG PO TBDP
ORAL_TABLET | ORAL | 1 refills | Status: AC
  Filled 2024-01-29: qty 30, 30d supply, fill #0

## 2024-02-02 ENCOUNTER — Other Ambulatory Visit: Payer: Self-pay

## 2024-02-02 ENCOUNTER — Other Ambulatory Visit (HOSPITAL_COMMUNITY): Payer: Self-pay

## 2024-02-03 ENCOUNTER — Other Ambulatory Visit: Payer: Self-pay

## 2024-02-11 ENCOUNTER — Other Ambulatory Visit: Payer: Self-pay

## 2024-02-13 ENCOUNTER — Other Ambulatory Visit: Payer: Self-pay
